# Patient Record
Sex: Female | Born: 1940 | Race: Black or African American | Hispanic: No | Smoking: Former smoker
Health system: Southern US, Community
[De-identification: ages and names within clinical notes are randomized; demographics above are authoritative.]

## PROBLEM LIST (undated history)

## (undated) DIAGNOSIS — I700 Atherosclerosis of aorta: Secondary | ICD-10-CM

## (undated) DIAGNOSIS — D4989 Neoplasm of unspecified behavior of other specified sites: Secondary | ICD-10-CM

## (undated) DIAGNOSIS — D496 Neoplasm of unspecified behavior of brain: Secondary | ICD-10-CM

## (undated) DIAGNOSIS — K828 Other specified diseases of gallbladder: Secondary | ICD-10-CM

## (undated) DIAGNOSIS — I10 Essential (primary) hypertension: Secondary | ICD-10-CM

## (undated) DIAGNOSIS — I3481 Nonrheumatic mitral (valve) annulus calcification: Secondary | ICD-10-CM

## (undated) DIAGNOSIS — E7800 Pure hypercholesterolemia, unspecified: Secondary | ICD-10-CM

## (undated) DIAGNOSIS — IMO0001 Reserved for inherently not codable concepts without codable children: Secondary | ICD-10-CM

## (undated) DIAGNOSIS — M1990 Unspecified osteoarthritis, unspecified site: Secondary | ICD-10-CM

## (undated) DIAGNOSIS — I517 Cardiomegaly: Secondary | ICD-10-CM

## (undated) DIAGNOSIS — R1013 Epigastric pain: Secondary | ICD-10-CM

## (undated) DIAGNOSIS — I059 Rheumatic mitral valve disease, unspecified: Secondary | ICD-10-CM

## (undated) DIAGNOSIS — K635 Polyp of colon: Secondary | ICD-10-CM

## (undated) HISTORY — DX: Other specified diseases of gallbladder: K82.8

## (undated) HISTORY — DX: Atherosclerosis of aorta: I70.0

## (undated) HISTORY — DX: Cardiomegaly: I51.7

## (undated) HISTORY — DX: Polyp of colon: K63.5

## (undated) HISTORY — DX: Rheumatic mitral valve disease, unspecified: I05.9

## (undated) HISTORY — DX: Reserved for inherently not codable concepts without codable children: IMO0001

## (undated) HISTORY — DX: Epigastric pain: R10.13

## (undated) HISTORY — DX: Pure hypercholesterolemia, unspecified: E78.00

## (undated) HISTORY — DX: Essential (primary) hypertension: I10

## (undated) HISTORY — DX: Unspecified osteoarthritis, unspecified site: M19.90

## (undated) HISTORY — DX: Neoplasm of unspecified behavior of other specified sites: D49.89

## (undated) HISTORY — DX: Nonrheumatic mitral (valve) annulus calcification: I34.81

## (undated) HISTORY — DX: Neoplasm of unspecified behavior of brain: D49.6

---

## 1979-05-16 HISTORY — PX: TUBAL LIGATION: SHX77

## 1989-05-15 HISTORY — PX: OTHER SURGICAL HISTORY: SHX169

## 1996-05-15 HISTORY — PX: OTHER SURGICAL HISTORY: SHX169

## 2000-04-02 ENCOUNTER — Encounter (INDEPENDENT_AMBULATORY_CARE_PROVIDER_SITE_OTHER): Payer: Self-pay | Admitting: Specialist

## 2000-04-02 ENCOUNTER — Ambulatory Visit (HOSPITAL_COMMUNITY): Admission: RE | Admit: 2000-04-02 | Discharge: 2000-04-02 | Payer: Self-pay | Admitting: *Deleted

## 2000-12-13 ENCOUNTER — Other Ambulatory Visit: Admission: RE | Admit: 2000-12-13 | Discharge: 2000-12-13 | Payer: Self-pay | Admitting: Family Medicine

## 2001-01-01 ENCOUNTER — Ambulatory Visit (HOSPITAL_COMMUNITY): Admission: RE | Admit: 2001-01-01 | Discharge: 2001-01-01 | Payer: Self-pay | Admitting: Family Medicine

## 2001-01-01 ENCOUNTER — Encounter: Payer: Self-pay | Admitting: Family Medicine

## 2002-01-06 ENCOUNTER — Encounter: Payer: Self-pay | Admitting: Family Medicine

## 2002-01-06 ENCOUNTER — Ambulatory Visit (HOSPITAL_COMMUNITY): Admission: RE | Admit: 2002-01-06 | Discharge: 2002-01-06 | Payer: Self-pay | Admitting: Family Medicine

## 2003-01-23 ENCOUNTER — Ambulatory Visit (HOSPITAL_COMMUNITY): Admission: RE | Admit: 2003-01-23 | Discharge: 2003-01-23 | Payer: Self-pay | Admitting: Family Medicine

## 2003-01-23 ENCOUNTER — Encounter: Payer: Self-pay | Admitting: Family Medicine

## 2003-04-04 ENCOUNTER — Emergency Department (HOSPITAL_COMMUNITY): Admission: AD | Admit: 2003-04-04 | Discharge: 2003-04-04 | Payer: Self-pay | Admitting: Family Medicine

## 2003-07-22 ENCOUNTER — Ambulatory Visit (HOSPITAL_COMMUNITY): Admission: RE | Admit: 2003-07-22 | Discharge: 2003-07-22 | Payer: Self-pay | Admitting: *Deleted

## 2004-02-01 ENCOUNTER — Ambulatory Visit (HOSPITAL_COMMUNITY): Admission: RE | Admit: 2004-02-01 | Discharge: 2004-02-01 | Payer: Self-pay | Admitting: Family Medicine

## 2004-02-02 ENCOUNTER — Emergency Department (HOSPITAL_COMMUNITY): Admission: EM | Admit: 2004-02-02 | Discharge: 2004-02-02 | Payer: Self-pay | Admitting: *Deleted

## 2004-08-01 ENCOUNTER — Ambulatory Visit (HOSPITAL_COMMUNITY): Admission: RE | Admit: 2004-08-01 | Discharge: 2004-08-01 | Payer: Self-pay | Admitting: Family Medicine

## 2005-02-21 ENCOUNTER — Ambulatory Visit (HOSPITAL_COMMUNITY): Admission: RE | Admit: 2005-02-21 | Discharge: 2005-02-21 | Payer: Self-pay | Admitting: Family Medicine

## 2006-02-22 ENCOUNTER — Ambulatory Visit (HOSPITAL_COMMUNITY): Admission: RE | Admit: 2006-02-22 | Discharge: 2006-02-22 | Payer: Self-pay | Admitting: Family Medicine

## 2007-03-01 ENCOUNTER — Ambulatory Visit (HOSPITAL_COMMUNITY): Admission: RE | Admit: 2007-03-01 | Discharge: 2007-03-01 | Payer: Self-pay | Admitting: Family Medicine

## 2007-11-12 ENCOUNTER — Emergency Department (HOSPITAL_COMMUNITY): Admission: EM | Admit: 2007-11-12 | Discharge: 2007-11-12 | Payer: Self-pay | Admitting: Emergency Medicine

## 2008-03-03 ENCOUNTER — Ambulatory Visit (HOSPITAL_COMMUNITY): Admission: RE | Admit: 2008-03-03 | Discharge: 2008-03-03 | Payer: Self-pay | Admitting: Family Medicine

## 2009-03-05 ENCOUNTER — Ambulatory Visit (HOSPITAL_COMMUNITY): Admission: RE | Admit: 2009-03-05 | Discharge: 2009-03-05 | Payer: Self-pay | Admitting: Family Medicine

## 2010-03-03 ENCOUNTER — Emergency Department (HOSPITAL_COMMUNITY): Admission: EM | Admit: 2010-03-03 | Discharge: 2010-03-03 | Payer: Self-pay | Admitting: Emergency Medicine

## 2010-03-07 ENCOUNTER — Ambulatory Visit (HOSPITAL_COMMUNITY): Admission: RE | Admit: 2010-03-07 | Discharge: 2010-03-07 | Payer: Self-pay | Admitting: Family Medicine

## 2010-09-30 NOTE — Procedures (Signed)
NAMEBRAYLIN, [name]           ACCOUNT NO.:  [id number]   MEDICAL RECORD NO.:  [id number]          PATIENT TYPE:  OUT   LOCATION:  RAD                           FACILITY:  [facility]   PHYSICIAN:  Dani Gobble, MD       DATE OF BIRTH:  06-30-1940   DATE OF PROCEDURE:  08/01/2004  DATE OF DISCHARGE:                                  ECHOCARDIOGRAM   REFERRING PHYSICIAN:  Dr. Christell Constant.   INDICATIONS:  [name] is a 70 year old female with hypertension and a  systolic murmur.   The technical quality of the study is adequate.   The aorta is within normal limits at 3.2 cm.   The left atrium is just mildly dilated, measuring at 4.1 cm.  No obvious  clots or masses are appreciated, and the patient appeared to be in sinus  rhythm during this procedure.   The intraventricular septum and posterior wall are both mildly thickened  with additional basal septal hypertrophy overlay.   The aortic valve is mildly thickened but trileaflet and pliable with normal  leaflet excursion.  No significant aortic insufficiency is noted.  Doppler  interrogation of the aortic valve reveals peak velocity of 1.3 meters per  second, corresponding to a peak gradient of 7 mmHg and a mean gradient of 5  mmHg.   The mitral valve is also mildly thickened but with normal leaflet excursion.  Mild mitral annular calcification is noted.  No mitral valve prolapse is  noted.  Trivial mitral regurgitation is noted.  Doppler interrogation of the  mitral valve is within normal limits.   The pulmonic valve is not well visualized.   The tricuspid valve appears grossly structurally normal with trivial  tricuspid regurgitation noted.   The left ventricle is normal in size with the LVIDD measured at 4.1 cm, and  the LVISD measured at 2.6 cm.  Overall left ventricular systolic function is  normal, and no regional wall motion abnormalities are noted.  The presence  of diastolic dysfunction is inferred from the pulse wave  Doppler across the  mitral valve.   The right atrium and right ventricle are normal in size, and the right  ventricular systolic function is normal.   There is a small posterior pericardial effusion without evidence of  hemodynamic embarrassment.   IMPRESSION:  1.  Mild left atrial dilatation.  2.  Mild concentric left ventricular hypertrophy with additional basal      septal hypertrophy overlay.  3.  Mild aortic sclerosis without stenosis.  4.  Mildly thickened mitral valve but with normal leaflet excursion.  5.  Mild mitral annular calcification.  6.  Trivial mitral and tricuspid regurgitation.  7.  Normal left ventricular size and systolic function without regional wall      motion abnormality noted.  8.  The presence of diastolic dysfunction is inferred from a pulse wave      Doppler across the mitral valve.  9.  There is a small posterior pericardial effusion without evidence of      hemodynamic embarrassment or compromise.      AB/MEDQ  D:  08/01/2004  T:  08/01/2004  Job:  [id number]   cc:   Ernestina Penna, M.D.  [address] [place]  [place] [address]  Fax: [phone]

## 2010-12-21 ENCOUNTER — Ambulatory Visit (HOSPITAL_COMMUNITY)
Admission: RE | Admit: 2010-12-21 | Discharge: 2010-12-21 | Disposition: A | Discharge status: Home / Self Care | Payer: Medicare Other | Source: Ambulatory Visit | Attending: Family Medicine | Admitting: Family Medicine

## 2010-12-21 ENCOUNTER — Other Ambulatory Visit: Payer: Self-pay | Admitting: Family Medicine

## 2010-12-21 DIAGNOSIS — K3189 Other diseases of stomach and duodenum: Secondary | ICD-10-CM | POA: Insufficient documentation

## 2010-12-21 DIAGNOSIS — A048 Other specified bacterial intestinal infections: Secondary | ICD-10-CM | POA: Insufficient documentation

## 2010-12-21 DIAGNOSIS — R1013 Epigastric pain: Secondary | ICD-10-CM

## 2010-12-21 DIAGNOSIS — N281 Cyst of kidney, acquired: Secondary | ICD-10-CM | POA: Insufficient documentation

## 2010-12-21 DIAGNOSIS — R0789 Other chest pain: Secondary | ICD-10-CM

## 2010-12-21 DIAGNOSIS — K838 Other specified diseases of biliary tract: Secondary | ICD-10-CM | POA: Insufficient documentation

## 2011-01-04 ENCOUNTER — Observation Stay (HOSPITAL_COMMUNITY)
Admission: EM | Admit: 2011-01-04 | Discharge: 2011-01-05 | Disposition: A | Discharge status: Home / Self Care | Payer: Medicare Other | Attending: Internal Medicine | Admitting: Internal Medicine

## 2011-01-04 ENCOUNTER — Emergency Department (HOSPITAL_COMMUNITY): Payer: Medicare Other

## 2011-01-04 DIAGNOSIS — D4989 Neoplasm of unspecified behavior of other specified sites: Secondary | ICD-10-CM | POA: Insufficient documentation

## 2011-01-04 DIAGNOSIS — I517 Cardiomegaly: Secondary | ICD-10-CM

## 2011-01-04 DIAGNOSIS — E669 Obesity, unspecified: Secondary | ICD-10-CM | POA: Insufficient documentation

## 2011-01-04 DIAGNOSIS — I10 Essential (primary) hypertension: Secondary | ICD-10-CM | POA: Insufficient documentation

## 2011-01-04 DIAGNOSIS — E7800 Pure hypercholesterolemia, unspecified: Secondary | ICD-10-CM | POA: Insufficient documentation

## 2011-01-04 DIAGNOSIS — R0609 Other forms of dyspnea: Secondary | ICD-10-CM | POA: Insufficient documentation

## 2011-01-04 DIAGNOSIS — R079 Chest pain, unspecified: Principal | ICD-10-CM | POA: Insufficient documentation

## 2011-01-04 DIAGNOSIS — R0989 Other specified symptoms and signs involving the circulatory and respiratory systems: Secondary | ICD-10-CM | POA: Insufficient documentation

## 2011-01-04 DIAGNOSIS — R5381 Other malaise: Secondary | ICD-10-CM | POA: Insufficient documentation

## 2011-01-04 DIAGNOSIS — R5383 Other fatigue: Secondary | ICD-10-CM | POA: Insufficient documentation

## 2011-01-04 DIAGNOSIS — Z85841 Personal history of malignant neoplasm of brain: Secondary | ICD-10-CM | POA: Insufficient documentation

## 2011-01-04 LAB — DIFFERENTIAL
Basophils Absolute: 0 10*3/uL (ref 0.0–0.1)
Basophils Relative: 0 % (ref 0–1)
Lymphocytes Relative: 52 % — ABNORMAL HIGH (ref 12–46)
Lymphs Abs: 2.8 10*3/uL (ref 0.7–4.0)
Monocytes Relative: 8 % (ref 3–12)
Neutro Abs: 2.1 10*3/uL (ref 1.7–7.7)

## 2011-01-04 LAB — CBC: WBC: 5.4 10*3/uL (ref 4.0–10.5)

## 2011-01-04 LAB — BASIC METABOLIC PANEL
Chloride: 101 mEq/L (ref 96–112)
GFR calc Af Amer: >60 mL/min (ref >60)
Glucose, Bld: 105 mg/dL — ABNORMAL HIGH (ref 70–99)

## 2011-01-04 LAB — CARDIAC PANEL(CRET KIN+CKTOT+MB+TROPI)
CK, MB: 2.9 ng/mL (ref 0.3–4.0)
CK, MB: 2.9 ng/mL (ref 0.3–4.0)
Relative Index: 2.1 (ref 0.0–2.5)
Total CK: 140 U/L (ref 7–177)
Total CK: 156 U/L (ref 7–177)
Troponin I: <0.3 ng/mL (ref <0.30)

## 2011-01-04 LAB — PRO B NATRIURETIC PEPTIDE: Pro B Natriuretic peptide (BNP): 25.7 pg/mL (ref 0–125)

## 2011-01-04 NOTE — H&P (Signed)
[name], [name] NO.:  [id number]  MEDICAL RECORD NO.:  [id number]  LOCATION:  [facility]                         FACILITY:  [facility]  PHYSICIAN:  Taylor Ruiz, MD    DATE OF BIRTH:  31-Oct-1940  DATE OF ADMISSION:  01/04/2011 DATE OF DISCHARGE:                             HISTORY & PHYSICAL   CHIEF COMPLAINT:  Chest pain.  HISTORY OF PRESENT ILLNESS:  The patient is a 70 year old black female, retired International aid/development worker, who came to the emergency department for left-sided chest pain radiating to the left arm since last night.  The patient was doing well until last night when she went to the mailbox and when she came back and sat in the chair she developed left-sided chest pain described as chest heaviness and tightness radiating to the left shoulder.  No associated diaphoresis, palpitations, lightheadedness, dizziness, or syncope.  The pain lasted a few minutes and improved, but was not completely resolved.  She was not able to sleep well and this morning had another recurrence of a similar pain.  The patient was evaluated by Dr. Juleen [name], emergency room physician, who performed a chest x-ray normal, EKG normal, set of, cardiac enzymes with negative troponin, BMP normal, CBC and CMP unremarkable except for mildly decreased potassium.  I have contacted her primary care physician, Dr. Tanya [name] and Dr. Christell [name] from [facility] at [phone], and I spoke with Dr. Modesto [name], who was covering for Dr. Tanya [name].  He told me that the patient has been having atypical chest pain since earlier this month for which she was seen once or twice and underwent an ultrasound of the abdomen revealing gallbladder sludge.  He referred her to Cardiology for a cardiac evaluation, but the patient subsequently cancelled the appointment.  In addition, the patient underwent blood testing that revealed positive serology for H. pylori and was placed on Prevpac without improvement  in her symptoms.  At the present time, the patient is chest pain-free and has no other complaints.  PAST MEDICAL HISTORY: 1. Hypertension, essential, severe. 2. Mild LVH with last echocardiogram performed in August 01, 2004, that     showed,     a.     Mild left atrial dilatation.     b.     Mild concentric LVH with additional vasal septal hypertrophy      overlay.     c.     Mild aortic sclerosis without stenosis.     d.     Mildly thickened mitral valve, but with normal leaflet      excursion.     e.     Mild mitral annular calcification.     f.     Trivial mitral and tricuspid regurgitation.     g.     Normal left ventricular size and systolic function without      regional wall motion abnormality.     h.     Small posterior pericardial effusion without evidence of      hemodynamic compromise. 3. Hypercholesterolemia. 4. Gallbladder sludge. 5. Brain tumor. 6. 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  MEDICATIONS: 1. Lipitor 80 mg a day. 2. Exforge HCT 320/10/25 mg. 3. Prevpac. 4.  Also takes aspirin 81 mg a day.  ALLERGIES:  No known drug allergies.  FAMILY HISTORY:  Stroke in her paternal grandmother.  No history of coronary artery disease, sudden death, or CHF.  SOCIAL HISTORY:  The patient is a retired Corporate treasurer and a widow.  She lives by herself and her children take care of her.  No toxic habits including no smoking tobacco, alcohol, or drug use.  REVIEW OF SYSTEMS:  CONSTITUTIONAL:  She is positive for fatigue, no malaise, fever, chills, night sweats, or weight loss.  CARDIOVASCULAR: As in HPI.  She denies edema, orthopnea, nocturia, or PND.  RESPIRATORY: Denies history of cough, wheezing, or pleuritic chest pain. GASTROINTESTINAL:  Denies abdominal pain, nausea, vomiting, diarrhea, constipation, hematochezia, or melena.  GU:  Denies dysuria, frequency, or hematuria.  Admits to some urinary incontinence with stress.  NEURO: Denies headaches, focal  weakness, numbness, or paresthesias.  PHYSICAL EXAMINATION:  VITAL SIGNS:  Blood pressure 139/79, heart rate is 84, respirations 16, temperature 98.4, saturation 96% on room air. GENERAL APPEARANCE:  The patient is an African-American overweight female, who lays in the stretcher, in no acute distress.  She is pleasant and cooperative. HEENT:  Unremarkable. NECK:  Supple without distention of jugular veins and no carotid bruits. HEART:  Regular S1 and S2 without gallops, murmurs, or rubs.  Due to body habitus, I am unable to palpate her PMI. LUNGS:  Clear to auscultation anteriorly and posteriorly. ABDOMEN:  Obese, soft, nontender without organomegaly or masses palpable. EXTREMITIES:  Without clubbing, cyanosis, or edema. NEUROLOGICAL:  Nonfocal.  LABORATORY DATA:  EKG, normal sinus rhythm at 89 beats per minute without specific ST or T-wave changes.  Chest x-ray negative.  Basic metabolic panel is normal except for a potassium of 3.4, troponin 0. CBC is normal.  Brain natriuretic peptide 25.7, which is normal.  IMPRESSION AND PLAN: 53. A 70 year old female with a history of severe hypertension with     mild LVH, hypercholesterolemia, presenting with chest pain without     EKG changes and normal cardiac enzymes including troponin and     normal BMP, normal chest x-ray.  Plan is to admit the patient for     observation in telemetry unit.  Cycle cardiac enzymes q.8 hours.     Repeat EKG in a.m.  If everything is negative, obtain stress test.     Cardiac consultation if indicated. 2. Hypokalemia.  KCl 40 mEq p.o. b.i.d. times 1 day. 3. Hypertension.  Continue Exforge at a current dose of 320/25/10. 4. Hypercholesterolemia.  Start simvastatin 40 mg a day.          ______________________________ Taylor Ruiz, MD     GL/MEDQ  D:  01/04/2011  T:  01/04/2011  Job:  [id number]  cc:   [name]  Electronically Signed by Taylor Ruiz MD on 01/04/2011 04:33:31  PM

## 2011-01-18 NOTE — Discharge Summary (Signed)
[name], [name] NO.:  [id number]  MEDICAL RECORD NO.:  [id number]  LOCATION:                                 FACILITY:  PHYSICIAN:  Taylor Llano, MD       DATE OF BIRTH:  October 16, 1940  DATE OF ADMISSION: DATE OF DISCHARGE:                              DISCHARGE SUMMARY   PRIMARY CARE PHYSICIAN:  Taylor Mouse II, MD  REASON FOR ADMISSION:  Chest pain.  DISCHARGE DIAGNOSES: 1. Chest pain, acute coronary syndrome ruled out. 2. Hypertension. 3. Hypercholesterolemia. 4. History of brain tumor. 5. 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 6. Obesity.  DISCHARGE MEDICATIONS: 1. Aspirin 81 mg p.o. daily. 2. Atorvastatin 80 mg p.o. daily. 3. Exforge HCT 10/320/25 p.o. daily. 4. Omega-3 acid ethyl esters 1 g p.o. daily. 5. Prevpac 30/500/500 p.o. b.i.d. 6. Vitamin B6 - 100 mg p.o. daily. 7. Vitamin C  1000 mg p.o. daily. 8. Vitamin D3 - 2000 units p.o. daily.  BRIEF HISTORY AND EXAMINATION:  [name] is a 70 year old African American female with past medical history of hypertension and dyslipidemia.  The patient came into the hospital complaining about left- sided chest pain.  The patient said she was in her usual state of health until the day of admission when she walked down and brought her mail and when she sat on the chair, she developed left-sided chest pain.  The pain is inframammary, feels as chest heaviness to tightness, a band around her left side of chest wall and radiates to the shoulder.  The patient denies any associated diaphoresis, palpitation, lightheadedness, or dizziness or syncope.  The pain lasted for less than minute and then went away.  The patient mentioned she did have previous episodes like this at the beginning of the month when the patient has been referred to a cardiologist, but the appointment was postponed because patient is being treated for H. pylori.  Upon initial evaluation in the emergency department, first  set of cardiac enzymes was negative.  The patient was admitted to rule out acute coronary syndrome.  RADIOLOGY: 1. Chest x-ray showed no evidence of pneumonia. 2. Echocardiogram showed ejection fraction of 55-60%.  Wall thickness     was increased in a pattern of moderate LVH.  There are no     significant valvular abnormalities.  BRIEF HOSPITAL COURSE: 1. Chest pain.  The patient was admitted to the hospital to a     telemetry bed as observation to rule out acute coronary syndrome.     Three sets of cardiac enzymes as well as EKG and repeat EKG were     negative for myocardial ischemia.  Chest x-ray was negative.     ProBNP is 25.  EKG and telemetry did not show any arrhythmias.  The     patient was still deemed safe to be discharged out of the hospital     after acute coronary syndrome ruled out.  Because of the patient's     symptoms recurrence, she needs likely a stress test to be done as     outpatient.  The patient was told to call the cardiologist again     and try to reschedule  her appointment for possible stress test as  outpatient. 2. Hypertension.  The patient's blood pressure was controlled     overnight.  Her Exforge HCT was restarted during the hospital stay.     The patient's blood pressure showed good control. 3. Dyslipidemia.  Her FLP was not obtained during this     hospitalization, but the patient was on atorvastatin 80 mg.  Please     note that there is a high risk of rhabdomyolysis with this high     dose of statin with concurrent use of amlodipine. 4. Hypokalemia.  There is slight hypokalemia.  The patient's potassium     was 3.4 at the time of admission, this was repleted with potassium     supplementation.  DISCHARGE INSTRUCTIONS: 1. Activity as tolerated. 2. Disposition home. 3. Diet - heart-healthy diet.     Taylor Llano, MD     ME/MEDQ  D:  01/05/2011  T:  01/05/2011  Job:  [id number]  cc:   Taylor Manges, MD  Electronically Signed by  [name]  on 01/18/2011 03:05:29 PM

## 2011-01-19 ENCOUNTER — Encounter: Payer: Self-pay | Admitting: Family Medicine

## 2011-01-19 ENCOUNTER — Other Ambulatory Visit: Payer: Self-pay | Admitting: Family Medicine

## 2011-01-19 ENCOUNTER — Telehealth: Payer: Self-pay | Admitting: Cardiovascular Disease

## 2011-01-19 DIAGNOSIS — K635 Polyp of colon: Secondary | ICD-10-CM | POA: Insufficient documentation

## 2011-01-19 DIAGNOSIS — E785 Hyperlipidemia, unspecified: Secondary | ICD-10-CM | POA: Insufficient documentation

## 2011-01-19 DIAGNOSIS — D496 Neoplasm of unspecified behavior of brain: Secondary | ICD-10-CM

## 2011-01-19 DIAGNOSIS — H3500 Unspecified background retinopathy: Secondary | ICD-10-CM | POA: Insufficient documentation

## 2011-01-19 DIAGNOSIS — K828 Other specified diseases of gallbladder: Secondary | ICD-10-CM

## 2011-01-19 DIAGNOSIS — M353 Polymyalgia rheumatica: Secondary | ICD-10-CM

## 2011-01-19 DIAGNOSIS — M069 Rheumatoid arthritis, unspecified: Secondary | ICD-10-CM | POA: Insufficient documentation

## 2011-01-19 DIAGNOSIS — I10 Essential (primary) hypertension: Secondary | ICD-10-CM | POA: Insufficient documentation

## 2011-01-19 DIAGNOSIS — E669 Obesity, unspecified: Secondary | ICD-10-CM

## 2011-01-19 DIAGNOSIS — A048 Other specified bacterial intestinal infections: Secondary | ICD-10-CM

## 2011-01-19 NOTE — Telephone Encounter (Signed)
App made 

## 2011-01-19 NOTE — Telephone Encounter (Signed)
Kim from [facility] called to schedule an appt for a former Dr. Orpah Cobb patient from 2004 for a consult since it's been over 3 years.  She states patient has atypical chest pain.  Pt is not in the office at this time but needs an appt in the next few weeks.  Please call office back to schedule pt sooner than next available.  Looking for chart for 2004 if available.

## 2011-01-20 ENCOUNTER — Encounter: Payer: Self-pay | Admitting: *Deleted

## 2011-01-23 ENCOUNTER — Encounter: Payer: Self-pay | Admitting: *Deleted

## 2011-01-26 ENCOUNTER — Ambulatory Visit (INDEPENDENT_AMBULATORY_CARE_PROVIDER_SITE_OTHER): Payer: BC Managed Care – PPO | Admitting: Cardiovascular Disease

## 2011-01-26 ENCOUNTER — Encounter: Payer: Self-pay | Admitting: Cardiovascular Disease

## 2011-01-26 DIAGNOSIS — R079 Chest pain, unspecified: Secondary | ICD-10-CM | POA: Insufficient documentation

## 2011-01-26 NOTE — Assessment & Plan Note (Addendum)
She recently had a hospitalization for chest pain. She ruled out for myocardial infarction. It was recommended that she see me for stress test.  Meanwhile she's been found to have an H. pylori infection and she may have gastritis or gastric ulcer.  We'll schedule her for a 2 day stress Myoview study. We will see her back on an as-needed basis following the stress test.

## 2011-01-26 NOTE — Progress Notes (Signed)
[name] Date of Birth  [birth year] [facility] [address]     [address] [place], [place]  [address] [phone]  Fax  [phone]  History of Present Illness:  70 year old female who I seen in the past.  She was recently admitted to the hospital with an episode of chest pain. She ruled out for myocardial infarction. She now presents for a stress test.  She has a history of arthritis. She's been taking prednisone for her arthritis and she thinks that that has increased her shortness of breath.   May be due to fluid retention.    Current Outpatient Prescriptions on File Prior to Visit  Medication Sig Dispense Refill  . Amlodipine-Valsartan-HCTZ 10-320-25 MG TABS Take 1 tablet by mouth daily.        . Ascorbic Acid (VITAMIN C) 1000 MG tablet Take 1,000 mg by mouth daily.        Marland Kitchen aspirin 81 MG chewable tablet Chew 81 mg by mouth daily.        Marland Kitchen atorvastatin (LIPITOR) 80 MG tablet Take 80 mg by mouth daily.        . Cholecalciferol (VITAMIN D3) 2000 UNITS TABS Take 1 capsule by mouth daily.        Marland Kitchen omega-3 acid ethyl esters (LOVAZA) 1 G capsule Take 1 g by mouth daily.        Marland Kitchen omeprazole (PRILOSEC) 40 MG capsule Take 40 mg by mouth daily.        Marland Kitchen pyridOXINE (VITAMIN B-6) 100 MG tablet Take 100 mg by mouth daily.          Allergies  Allergen Reactions  . Vytorin (Ezetimibe-Simvastatin) Other (See Comments)    myalgia    Past Medical History  Diagnosis Date  . Chest pain   . Dyspepsia   . Hypertension   . Left atrial dilatation     Mild  . Mild aortic sclerosis     without stenosis  . Mitral valve disorder     Mildly thickened mitral valve, but with normal leaflet but with normal leaflet   . Mitral annular calcification     Mild  . Hypercholesterolemia   . Brain tumor   . 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 Gallbladder sludge     Past Surgical History  Procedure Date  .  Tubal ligation 1981  . Rt knee surg 1991  . Lt eye implant 1998    History  Smoking status  . Former Smoker  Smokeless tobacco  . Not on file    History  Alcohol Use: Not on file    Family History  Problem Relation Age of Onset  . Stroke Paternal Grandmother     Reviw of Systems:  Reviewed in the HPI.  All other systems are negative.  Physical Exam: BP 152/88  Pulse 74  Ht 5\' 4"  (1.626 m)  Wt 241 lb (109.317 kg)  BMI 41.37 kg/m2 The patient is alert and oriented x 3.  The mood and affect are normal.   Skin: warm and dry.  Color is normal.    HEENT:   the sclera are nonicteric.  The mucous membranes are moist.  The carotids are 2+ without bruits.  There is no thyromegaly.  There is no JVD.    Lungs: clear.  The chest wall is non tender.    Heart: regular rate with  a normal S1 and S2.  There are no murmurs, gallops, or rubs. The PMI is not displaced.     Abdomen: good bowel sounds.  There is no guarding or rebound.  There is no hepatosplenomegaly or tenderness.  There are no masses.   Extremities:  no clubbing, cyanosis, or edema.  The legs are without rashes.  The distal pulses are intact.   Neuro:  Cranial nerves II - XII are intact.  Motor and sensory functions are intact.    The gait is normal.  ECG:  Assessment / Plan:

## 2011-02-01 ENCOUNTER — Encounter: Payer: Self-pay | Admitting: *Deleted

## 2011-02-09 LAB — POCT I-STAT, CHEM 8
Calcium, Ion: 1.21
Chloride: 106
Glucose, Bld: 106 — ABNORMAL HIGH
HCT: 39

## 2011-02-09 LAB — CBC
HCT: 37.8
MCHC: 33.3
Platelets: 308
RDW: 14.9
WBC: 6.7

## 2011-02-09 LAB — POCT CARDIAC MARKERS: Troponin i, poc: <0.05

## 2011-02-16 ENCOUNTER — Ambulatory Visit (HOSPITAL_COMMUNITY): Payer: Medicare Other | Attending: Cardiovascular Disease | Admitting: Radiology

## 2011-02-16 DIAGNOSIS — I119 Hypertensive heart disease without heart failure: Secondary | ICD-10-CM

## 2011-02-16 DIAGNOSIS — R0789 Other chest pain: Secondary | ICD-10-CM

## 2011-02-16 DIAGNOSIS — R079 Chest pain, unspecified: Secondary | ICD-10-CM | POA: Insufficient documentation

## 2011-02-16 MED ORDER — REGADENOSON 0.4 MG/5ML IV SOLN
0.4000 mg | Freq: Once | INTRAVENOUS | Status: AC
  Administered 2011-02-16: 0.4 mg via INTRAVENOUS

## 2011-02-16 MED ORDER — TECHNETIUM TC 99M TETROFOSMIN IV KIT
33.0000 | PACK | Freq: Once | INTRAVENOUS | Status: AC | PRN
  Administered 2011-02-16: 33 via INTRAVENOUS

## 2011-02-16 NOTE — Progress Notes (Signed)
[facility] SITE 3 NUCLEAR MED [address] [place] [address] [phone]  Cardiology Nuclear Med Study  [name] is a 70 y.o. female [id number] [birth year]   Nuclear Med Background Indication for Stress Test:  Evaluation for Ischemia and 01/05/11 Post Hospital: [place], [place] R/O History: 01/04/11 Echo: EF 55-60% mild LVH and '04 Myocardial Perfusion Study: (-) EF 72% Cardiac Risk Factors: History of Smoking, Hypertension, Lipids and Obesity  Symptoms:  Chest Pressure and Chest Tightness   Nuclear Pre-Procedure Caffeine/Decaff Intake:  None NPO After: 9:00pm   Lungs:  clear IV 0.9% NS with Angio Cath:  20g  IV Site: R Antecubital  IV Started by:  Stanton Kidney, EMT-P  Chest Size (in):  48 Cup Size: D  Height: 5\' 4"  (1.626 m)  Weight:  245 lb (111.131 kg)  BMI:  Body mass index is 42.05 kg/(m^2). Tech Comments:  NA    Nuclear Med Study 1 or 2 day study: 2 day  Stress Test Type:  Lexiscan  Reading OZ:HYQMVH Marrian Bells,M.D. Order Authorizing Provider:  P.Nahser  Resting Radionuclide: Technetium 55m Tetrofosmin  Resting Radionuclide Dose: 33.0 mCi   Stress Radionuclide:  Technetium 12m Tetrofosmin  Stress Radionuclide Dose: 33.0 mCi           Stress Protocol Rest HR: 82 Stress HR: 131  Rest BP: 156/75 Stress BP: 218/97  Exercise Time (min): n/a METS: n/a   Predicted Max HR: 151 bpm % Max HR: 86.75 bpm Rate Pressure Product: 84696   Dose of Adenosine (mg):  n/a Dose of Lexiscan: 0.4 mg  Dose of Atropine (mg): n/a Dose of Dobutamine: n/a mcg/kg/min (at max HR)  Stress Test Technologist: Milana Na, EMT-P  Nuclear Technologist:  Domenic Polite, CNMT     Rest Procedure:  Myocardial perfusion imaging was performed at rest 45 minutes following the intravenous administration of Technetium 53m Tetrofosmin. Rest ECG: NSR  Stress Procedure:  The patient received IV Lexiscan 0.4 mg over 15-seconds.  Technetium 2m Tetrofosmin injected at  30-seconds.  There were non specific changes with Lexiscan. The patient had no symptoms with Lexiscan.  Quantitative spect images were obtained after a 45 minute delay. Stress ECG: No significant change from baseline ECG  QPS Raw Data Images:  There is a breast shadow that accounts for the anterior attenuation. Stress Images:  There is decreased uptake in the anterior wall. Rest Images:  Normal homogeneous uptake in all areas of the myocardium. Subtraction (SDS):  There is a reversibe anterior defect which appears to be due to shifting breast attenuation seen on the raw images.  Transient Ischemic Dilatation (Normal <1.22):  0.83 Lung/Heart Ratio (Normal <0.45):  0.28  Quantitative Gated Spect Images QGS EDV:  66 ml QGS ESV:  14 ml QGS cine images:  NL LV Function; NL Wall Motion QGS EF: 78%  Impression Exercise Capacity:  Lexiscan with low level exercise. BP Response:  n/a  Clinical Symptoms:  n/a  ECG Impression:  No significant ECG changes with Lexiscan and low-level exercise . Comparison with Prior Nuclear Study: No previous nuclear study performed  Overall Impression:   Probably normal Lexiscan Myoview. There is a reversibe anterior defect which appears to be due to shifting breast attenuation seen on the raw images.  [name]

## 2011-02-20 ENCOUNTER — Other Ambulatory Visit: Payer: Self-pay | Admitting: Family Medicine

## 2011-02-20 DIAGNOSIS — Z139 Encounter for screening, unspecified: Secondary | ICD-10-CM

## 2011-02-21 ENCOUNTER — Ambulatory Visit (HOSPITAL_COMMUNITY): Payer: Medicare Other | Attending: Cardiovascular Disease | Admitting: Radiology

## 2011-02-21 DIAGNOSIS — R0989 Other specified symptoms and signs involving the circulatory and respiratory systems: Secondary | ICD-10-CM

## 2011-02-21 MED ORDER — TECHNETIUM TC 99M TETROFOSMIN IV KIT
33.0000 | PACK | Freq: Once | INTRAVENOUS | Status: AC | PRN
  Administered 2011-02-21: 33 via INTRAVENOUS

## 2011-03-10 ENCOUNTER — Ambulatory Visit (HOSPITAL_COMMUNITY)
Admission: RE | Admit: 2011-03-10 | Discharge: 2011-03-10 | Disposition: A | Discharge status: Home / Self Care | Payer: Medicare Other | Source: Ambulatory Visit | Attending: Family Medicine | Admitting: Family Medicine

## 2011-03-10 DIAGNOSIS — Z139 Encounter for screening, unspecified: Secondary | ICD-10-CM

## 2011-03-10 DIAGNOSIS — Z1231 Encounter for screening mammogram for malignant neoplasm of breast: Secondary | ICD-10-CM | POA: Insufficient documentation

## 2011-03-31 ENCOUNTER — Other Ambulatory Visit: Payer: Self-pay | Admitting: Family Medicine

## 2011-03-31 DIAGNOSIS — R140 Abdominal distension (gaseous): Secondary | ICD-10-CM

## 2011-04-05 ENCOUNTER — Encounter (HOSPITAL_COMMUNITY): Payer: Medicare Other

## 2011-04-12 ENCOUNTER — Encounter (HOSPITAL_COMMUNITY)
Admission: RE | Admit: 2011-04-12 | Discharge: 2011-04-12 | Disposition: A | Discharge status: Home / Self Care | Payer: Medicare Other | Source: Ambulatory Visit | Attending: Family Medicine | Admitting: Family Medicine

## 2011-04-12 ENCOUNTER — Encounter (HOSPITAL_COMMUNITY): Payer: Self-pay

## 2011-04-12 DIAGNOSIS — R109 Unspecified abdominal pain: Secondary | ICD-10-CM | POA: Insufficient documentation

## 2011-04-12 DIAGNOSIS — K838 Other specified diseases of biliary tract: Secondary | ICD-10-CM | POA: Insufficient documentation

## 2011-04-12 DIAGNOSIS — R140 Abdominal distension (gaseous): Secondary | ICD-10-CM

## 2011-04-12 MED ORDER — SINCALIDE 5 MCG IJ SOLR
INTRAMUSCULAR | Status: AC
  Administered 2011-04-12: 2.3 ug via INTRAVENOUS
  Filled 2011-04-12: qty 5

## 2011-04-12 MED ORDER — SINCALIDE 5 MCG IJ SOLR
0.0200 ug/kg | Freq: Once | INTRAMUSCULAR | Status: AC
  Administered 2011-04-12: 2.3 ug via INTRAVENOUS

## 2011-04-12 MED ORDER — TECHNETIUM TC 99M MEBROFENIN IV KIT
5.0000 | PACK | Freq: Once | INTRAVENOUS | Status: AC | PRN
  Administered 2011-04-12: 5.4 via INTRAVENOUS

## 2011-09-08 ENCOUNTER — Encounter: Payer: Self-pay | Admitting: *Deleted

## 2011-09-08 ENCOUNTER — Encounter: Payer: Medicare Other | Attending: Family Medicine | Admitting: *Deleted

## 2011-09-08 VITALS — Ht 64.0 in | Wt 255.7 lb

## 2011-09-08 DIAGNOSIS — E669 Obesity, unspecified: Secondary | ICD-10-CM

## 2011-09-08 DIAGNOSIS — I10 Essential (primary) hypertension: Secondary | ICD-10-CM | POA: Insufficient documentation

## 2011-09-08 DIAGNOSIS — Z713 Dietary counseling and surveillance: Secondary | ICD-10-CM | POA: Insufficient documentation

## 2011-09-08 NOTE — Progress Notes (Signed)
Medical Nutrition Therapy:  Appt start time: 0945 end time:  1045.  Assessment:  Primary concerns today: patietn here for assistance with weight loss and hypertension. 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 She plans to retire in 6 months after working there 47 years! She enjoys being active at work as well as in her yard and with her large family.   MEDICATIONS: see list   DIETARY INTAKE:  Usual eating pattern includes 2 meals and 1-2 snacks per day.  Everyday foods include fair variety of most food groups.  Avoided foods include dairy milk, fried foods, sweets.    24-hr recall:  B (10 AM): cheerios with [place]k milk OR egg and bacon bagel sandwich, occasionally OJ, coffee with non dairy creamer Snk ( AM): none except  [place] or Activia yogurt  L (4  PM): left overs OR vegetable steamers with lean meat with water Snk ( PM): none or yogurt D ( PM): none Snk ( PM): none Beverages: coffee, water  Usual physical activity: enjoys being active at work, yard work, walking with family as able. Hasn't been able to be active due to being laid off and feeling more tired than usual  Estimated energy needs: 1200 calories 135 g carbohydrates 90 g protein 33 g fat  Progress Towards Goal(s):  In progress.   Nutritional Diagnosis:  NI-1.5 Excessive energy intake As related to current activity level.  As evidenced by BMI of 44%.    Intervention:  Nutrition counseling provided for weight loss using Carb Counting. Patient expresses good verbal understanding of need for sodium restriction for HTN. Plan: Continue with good variety of food groups and low fat meats Continue with yogurt snacks in between meals Continue with pedal and arm exercises every day 15-30 minutes, consider adding weights with canned foods (1-3 pounds max) Continue with water intake Read Food Labels for Total Carbohydrate (15 grams is a serving) and Fat Grams (5 grams is a serving)  Handouts  given during visit include:  Carb Counting and reading food labels  Meal planning card  Monitoring/Evaluation:  Dietary intake, exercise, reading food labels, and body weight prn.

## 2011-09-08 NOTE — Patient Instructions (Addendum)
Plan: Continue with good variety of food groups and low fat meats Continue with yogurt snacks in between meals Continue with pedal and arm exercises every day 15-30 minutes, consider adding weights with canned foods (1-3 pounds max) Continue with water intake Read Food Labels for Total Carbohydrate (15 grams is a serving) and Fat Grams (5 grams is a serving)

## 2012-02-07 ENCOUNTER — Other Ambulatory Visit: Payer: Self-pay | Admitting: Family Medicine

## 2012-02-07 DIAGNOSIS — Z139 Encounter for screening, unspecified: Secondary | ICD-10-CM

## 2012-03-12 ENCOUNTER — Ambulatory Visit (HOSPITAL_COMMUNITY)
Admission: RE | Admit: 2012-03-12 | Discharge: 2012-03-12 | Disposition: A | Discharge status: Home / Self Care | Payer: Medicare Other | Source: Ambulatory Visit | Attending: Family Medicine | Admitting: Family Medicine

## 2012-03-12 DIAGNOSIS — Z139 Encounter for screening, unspecified: Secondary | ICD-10-CM

## 2012-03-12 DIAGNOSIS — Z1231 Encounter for screening mammogram for malignant neoplasm of breast: Secondary | ICD-10-CM | POA: Insufficient documentation

## 2012-04-16 ENCOUNTER — Ambulatory Visit (HOSPITAL_COMMUNITY)
Admission: RE | Admit: 2012-04-16 | Discharge: 2012-04-16 | Disposition: A | Discharge status: Home / Self Care | Payer: Medicare Other | Source: Ambulatory Visit | Attending: Emergency Medicine | Admitting: Emergency Medicine

## 2012-04-16 ENCOUNTER — Encounter (HOSPITAL_COMMUNITY): Payer: Self-pay | Admitting: Emergency Medicine

## 2012-04-16 ENCOUNTER — Emergency Department (HOSPITAL_COMMUNITY)
Admission: EM | Admit: 2012-04-16 | Discharge: 2012-04-16 | Disposition: A | Discharge status: Home / Self Care | Payer: Medicare Other | Source: Home / Self Care | Attending: Emergency Medicine | Admitting: Emergency Medicine

## 2012-04-16 DIAGNOSIS — M25569 Pain in unspecified knee: Secondary | ICD-10-CM | POA: Insufficient documentation

## 2012-04-16 DIAGNOSIS — IMO0002 Reserved for concepts with insufficient information to code with codable children: Secondary | ICD-10-CM | POA: Insufficient documentation

## 2012-04-16 DIAGNOSIS — M1710 Unilateral primary osteoarthritis, unspecified knee: Secondary | ICD-10-CM | POA: Insufficient documentation

## 2012-04-16 DIAGNOSIS — M25561 Pain in right knee: Secondary | ICD-10-CM

## 2012-04-16 MED ORDER — MELOXICAM 7.5 MG PO TABS: 7.5000 mg | ORAL_TABLET | Freq: Every day | ORAL | Status: DC

## 2012-04-16 MED ORDER — HYDROCODONE-ACETAMINOPHEN 5-500 MG PO TABS: 1.0000 | ORAL_TABLET | Freq: Four times a day (QID) | ORAL | Status: DC | PRN

## 2012-04-16 NOTE — ED Provider Notes (Addendum)
History     CSN: [id number]  Arrival date & time 04/16/12  1350   First MD Initiated Contact with Patient 04/16/12 1513      Chief Complaint  Patient presents with  . Knee Pain    (Consider location/radiation/quality/duration/timing/severity/associated sxs/prior treatment) HPI Comments: Patient presents urgent care complaining of right knee pain for about a week. She describes that she was cleaning some leaves the other day and that might have trigger her right knee pain she describes it many years ago she did have some knee surgery " a clean my arthritis from my knee in the 1990s" " I have not had the problem since then". It just started aching, for several days. No recent falls or injuries no increase soft tissue swelling.  Patient is a 71 y.o. female presenting with knee pain. The history is provided by the patient.  Knee Pain This is a new problem. The problem occurs constantly. The problem has not changed since onset.Pertinent negatives include no chest pain and no abdominal pain. The symptoms are aggravated by bending and twisting. Nothing relieves the symptoms. She has tried nothing for the symptoms. The treatment provided no relief.    Past Medical History  Diagnosis Date  . Chest pain   . Dyspepsia   . Hypertension   . Left atrial dilatation     Mild  . Mild aortic sclerosis     without stenosis  . Mitral valve disorder     Mildly thickened mitral valve, but with normal leaflet but with normal leaflet   . Mitral annular calcification     Mild  . Hypercholesterolemia   . Brain tumor   . 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 Gallbladder sludge     Past Surgical History  Procedure Date  . Tubal ligation 1981  . Rt knee surg 1991  . Lt eye implant 1998    Family History  Problem Relation Age of Onset  . Stroke Paternal Grandmother     History  Substance Use Topics  . Smoking status: Former Smoker    Quit date:  09/07/1973  . Smokeless tobacco: Never Used  . Alcohol Use: No    OB History    Grav Para Term Preterm Abortions TAB SAB Ect Mult Living                  Review of Systems  Constitutional: Positive for activity change and appetite change. Negative for fever, chills, diaphoresis, fatigue and unexpected weight change.  Cardiovascular: Negative for chest pain.  Gastrointestinal: Negative for abdominal pain.  Musculoskeletal: Positive for joint swelling. Negative for myalgias, back pain, arthralgias and gait problem.  Skin: Negative for pallor, rash and wound.    Allergies  Vytorin  Home Medications   Current Outpatient Rx  Name  Route  Sig  Dispense  Refill  . AMLODIPINE-VALSARTAN-HCTZ 10-320-25 MG PO TABS   Oral   Take 1 tablet by mouth daily.           Marland Kitchen VITAMIN C 1000 MG PO TABS   Oral   Take 1,000 mg by mouth daily.           . ASPIRIN 81 MG PO CHEW   Oral   Chew 81 mg by mouth daily.           . ATORVASTATIN CALCIUM 80 MG PO TABS   Oral   Take 80 mg by  mouth daily.           Marland Kitchen VITAMIN D3 2000 UNITS PO TABS   Oral   Take 1 capsule by mouth daily.           Marland Kitchen HYDROCODONE-ACETAMINOPHEN 5-500 MG PO TABS   Oral   Take 1-2 tablets by mouth every 6 (six) hours as needed for pain.   15 tablet   0   . MELOXICAM 7.5 MG PO TABS   Oral   Take 1 tablet (7.5 mg total) by mouth daily. Take one tablet daily for 2 weeks   14 tablet   0   . OMEGA-3-ACID ETHYL ESTERS 1 G PO CAPS   Oral   Take 1 g by mouth daily.           Marland Kitchen OMEPRAZOLE 40 MG PO CPDR   Oral   Take 40 mg by mouth daily.           Marland Kitchen VITAMIN B-6 100 MG PO TABS   Oral   Take 100 mg by mouth daily.             BP 192/74  Pulse 90  Temp 98.1 F (36.7 C) (Oral)  Resp 22  SpO2 97%  Physical Exam  Nursing note and vitals reviewed. Constitutional: She is oriented to person, place, and time. Vital signs are normal. She appears well-developed and well-nourished.  Eyes: Conjunctivae  normal are normal.  Abdominal: Soft. She exhibits no distension. There is no tenderness. There is no rebound.  Musculoskeletal: She exhibits tenderness.       Right knee: She exhibits decreased range of motion and swelling. She exhibits no effusion, no ecchymosis, normal alignment, no LCL laxity, normal patellar mobility, no bony tenderness, normal meniscus and no MCL laxity.       Legs: Neurological: She is alert and oriented to person, place, and time.  Skin: No erythema. No pallor.    ED Course  Procedures (including critical care time)  Labs Reviewed - No data to display No results found.   1. Knee pain, right       MDM  Chronic exacerbation of right knee pain. Knee exam does not reveal a increased laxity seems like a stable stable knee. Patient was prescribed a course of meloxicam told to take it for 7 days only and to extend to 2 weeks if no improvement. Was also provided with 15 tablets of Lortab. Encourage her to followup with her primary care Dr. or an orthopedic doctor pain was to persist or unable to bear any weight on it. We have order an x-ray tonight and will followup on the results ( x-ray machine broken at urgent care tonight).       Jimmie Molly, MD 04/16/12 1554  Jimmie Molly, MD 04/16/12 [phone]

## 2012-04-16 NOTE — ED Notes (Signed)
Pt having right knee pain for one week. Pt states she has had problems with this knee in the past, and it is acting up again. Pt has not tried any OTC meds or remedies.

## 2012-07-17 ENCOUNTER — Encounter: Payer: Self-pay | Admitting: Family Medicine

## 2012-09-06 ENCOUNTER — Telehealth: Payer: Self-pay | Admitting: Family Medicine

## 2012-09-06 MED ORDER — FENOFIBRATE 54 MG PO TABS: 54.0000 mg | ORAL_TABLET | Freq: Every day | ORAL | Status: DC

## 2012-09-06 NOTE — Telephone Encounter (Signed)
Rx Refilled  

## 2012-09-24 ENCOUNTER — Ambulatory Visit (INDEPENDENT_AMBULATORY_CARE_PROVIDER_SITE_OTHER): Payer: Medicare Other | Admitting: Family Medicine

## 2012-09-24 ENCOUNTER — Encounter: Payer: Self-pay | Admitting: Family Medicine

## 2012-09-24 VITALS — BP 150/100 | HR 78 | Temp 98.4°F | Resp 20 | Wt 264.0 lb

## 2012-09-24 DIAGNOSIS — E785 Hyperlipidemia, unspecified: Secondary | ICD-10-CM

## 2012-09-24 DIAGNOSIS — I10 Essential (primary) hypertension: Secondary | ICD-10-CM

## 2012-09-24 LAB — BASIC METABOLIC PANEL
Glucose, Bld: 102 mg/dL — ABNORMAL HIGH (ref 70–99)
Potassium: 4.5 mEq/L (ref 3.5–5.3)
Sodium: 144 mEq/L (ref 135–145)

## 2012-09-24 LAB — HEPATIC FUNCTION PANEL
AST: 24 U/L (ref 0–37)
Albumin: 4.4 g/dL (ref 3.5–5.2)
Alkaline Phosphatase: 64 U/L (ref 39–117)
Bilirubin, Direct: 0.1 mg/dL (ref 0.0–0.3)
Total Bilirubin: 0.4 mg/dL (ref 0.3–1.2)

## 2012-09-24 LAB — LIPID PANEL
LDL Cholesterol: 114 mg/dL — ABNORMAL HIGH (ref 0–99)
Total CHOL/HDL Ratio: 3 Ratio
VLDL: 16 mg/dL (ref 0–40)

## 2012-09-24 NOTE — Progress Notes (Signed)
Subjective:    Patient ID: [name], female    DOB: 23-Feb-1941, 72 y.o.   MRN: [id number]  HPI Patient is here today for followup of her hypertension and her hyperlipidemia. She's currently taking Norvasc 10 mg by mouth daily and Hyzaar 100/25 by mouth daily. She states her blood pressure home is 130/80. It is always well controlled at home. She is anxious this morning she has not taken her medications. She like to recheck blood pressure over the next week she had a medicine to it. She denies any chest pain or shortness of breath.  She had a negative stress Myoview in October of 2012.  She is on Lipitor 80 mg by mouth daily and fenofibrate 54 mg by mouth daily for her hyperlipidemia. She does not having any myalgias at all. She has a history of possible PMR. She is off prednisone and doing well. She says she's not had muscle aches in quite sometime.  As the report pain in her left knee due to arthritis. She is requesting a cortisone injection Past Medical History  Diagnosis Date  . Chest pain   . Dyspepsia   . Hypertension   . Left atrial dilatation     Mild  . Mild aortic sclerosis     without stenosis  . Mitral valve disorder     Mildly thickened mitral valve, but with normal leaflet but with normal leaflet   . Mitral annular calcification     Mild  . Hypercholesterolemia   . Brain tumor   . 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 Gallbladder sludge   . Colon polyps   . Arthritis     rheumatoid arthritis   Current Outpatient Prescriptions on File Prior to Visit  Medication Sig Dispense Refill  . amLODipine (NORVASC) 10 MG tablet Take 10 mg by mouth daily.      . Ascorbic Acid (VITAMIN C) 1000 MG tablet Take 1,000 mg by mouth daily.        Marland Kitchen aspirin 81 MG chewable tablet Chew 81 mg by mouth daily.        Marland Kitchen atorvastatin (LIPITOR) 80 MG tablet Take 80 mg by mouth daily.        . Cholecalciferol (VITAMIN D3) 2000 UNITS TABS Take 1  capsule by mouth daily.        . fenofibrate 54 MG tablet Take 1 tablet (54 mg total) by mouth daily.  30 tablet  5  . losartan-hydrochlorothiazide (HYZAAR) 100-25 MG per tablet Take 1 tablet by mouth daily.      Marland Kitchen omega-3 acid ethyl esters (LOVAZA) 1 G capsule Take 1 g by mouth daily.        Marland Kitchen omeprazole (PRILOSEC) 40 MG capsule Take 40 mg by mouth daily.        Marland Kitchen pyridOXINE (VITAMIN B-6) 100 MG tablet Take 100 mg by mouth daily.         No current facility-administered medications on file prior to visit.   Allergies  Allergen Reactions  . Vytorin (Ezetimibe-Simvastatin) Other (See Comments)    myalgia   History   Social History  . Marital Status: Widowed    Spouse Name: N/A    Number of Children: N/A  . Years of Education: N/A   Occupational History  . Not on file.   Social History Main Topics  . Smoking status: Former Smoker    Quit date: 09/07/1973  .  Smokeless tobacco: Never Used  . Alcohol Use: No  . Drug Use: No  . Sexually Active: Not on file   Other Topics Concern  . Not on file   Social History Narrative  . No narrative on file     Review of Systems  All other systems reviewed and are negative.       Objective:   Physical Exam  Cardiovascular: Normal rate, regular rhythm and intact distal pulses.   Murmur heard. Pulmonary/Chest: Effort normal and breath sounds normal. No respiratory distress. She has no wheezes. She has no rales.  Abdominal: Soft. Bowel sounds are normal. She exhibits no distension. There is no tenderness. There is no rebound.  Musculoskeletal:       Right knee: She exhibits bony tenderness. She exhibits normal range of motion and normal meniscus. Tenderness found. Medial joint line tenderness noted. No patellar tendon tenderness noted.       Left upper leg: Normal. She exhibits no tenderness, no bony tenderness and no swelling.          Assessment & Plan:  1. HTN (hypertension) Patient is hesitant to add any medications at  this time. She was to check her blood pressure everyday for one week. She'll see me back in one week. If the blood pressure is persistently elevated, we are going to add atenolol 50 mg by mouth daily.  She agrees to this. - Basic Metabolic Panel  2. HLD (hyperlipidemia) Check CMP and fasting lipid panel. Goal LDL is less than 100 - Hepatic Function Panel - Lipid Panel She has knee pain due to osteoarthritis, we can proceed with cortisone injection at her followup in one-week of her blood pressure better controlled.

## 2012-10-02 ENCOUNTER — Encounter: Payer: Self-pay | Admitting: Family Medicine

## 2012-10-02 ENCOUNTER — Ambulatory Visit (INDEPENDENT_AMBULATORY_CARE_PROVIDER_SITE_OTHER): Payer: Medicare Other | Admitting: Family Medicine

## 2012-10-02 VITALS — BP 150/80 | HR 66 | Temp 98.5°F | Resp 18 | Ht 64.0 in | Wt 264.0 lb

## 2012-10-02 DIAGNOSIS — I10 Essential (primary) hypertension: Secondary | ICD-10-CM

## 2012-10-02 DIAGNOSIS — M1711 Unilateral primary osteoarthritis, right knee: Secondary | ICD-10-CM

## 2012-10-02 DIAGNOSIS — M1710 Unilateral primary osteoarthritis, unspecified knee: Secondary | ICD-10-CM

## 2012-10-02 MED ORDER — ATENOLOL 50 MG PO TABS: 50.0000 mg | ORAL_TABLET | Freq: Every day | ORAL | Status: DC

## 2012-10-02 NOTE — Progress Notes (Signed)
Subjective:    Patient ID: [name], female    DOB: 04/21/1941, 72 y.o.   MRN: [id number]  HPI 09/24/12 Patient is here today for followup of her hypertension and her hyperlipidemia. She's currently taking Norvasc 10 mg by mouth daily and Hyzaar 100/25 by mouth daily. She states her blood pressure home is 130/80. It is always well controlled at home. She is anxious this morning she has not taken her medications. She like to recheck blood pressure over the next week she had a medicine to it. She denies any chest pain or shortness of breath.  She had a negative stress Myoview in October of 2012.  She is on Lipitor 80 mg by mouth daily and fenofibrate 54 mg by mouth daily for her hyperlipidemia. She does not having any myalgias at all. She has a history of possible PMR. She is off prednisone and doing well. She says she's not had muscle aches in quite sometime.  As the report pain in her left knee due to arthritis. She is requesting a cortisone injection  Therefore, I diagnosed her with: 1. HTN (hypertension) Patient is hesitant to add any medications at this time. She was to check her blood pressure everyday for one week. She'll see me back in one week. If the blood pressure is persistently elevated, we are going to add atenolol 50 mg by mouth daily.  She agrees to this. - Basic Metabolic Panel  2. HLD (hyperlipidemia) Check CMP and fasting lipid panel. Goal LDL is less than 100 - Hepatic Function Panel - Lipid Panel She has knee pain due to osteoarthritis, we can proceed with cortisone injection at her followup in one-week of her blood pressure better controlled.  10/02/12 Her blood pressure at home has been ranging 140-160/90.  She denies chest pain or shortness of breath. She continues to complain of pain in her right knee it is worse with walking and standing. It is over both joint lines now. History of osteoarthritis. Past Medical History  Diagnosis Date  . Chest pain   .  Dyspepsia   . Hypertension   . Left atrial dilatation     Mild  . Mild aortic sclerosis     without stenosis  . Mitral valve disorder     Mildly thickened mitral valve, but with normal leaflet but with normal leaflet   . Mitral annular calcification     Mild  . Hypercholesterolemia   . Brain tumor   . 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 Gallbladder sludge   . Colon polyps   . Arthritis     rheumatoid arthritis   Current Outpatient Prescriptions on File Prior to Visit  Medication Sig Dispense Refill  . amLODipine (NORVASC) 10 MG tablet Take 10 mg by mouth daily.      . Ascorbic Acid (VITAMIN C) 1000 MG tablet Take 1,000 mg by mouth daily.        Marland Kitchen aspirin 81 MG chewable tablet Chew 81 mg by mouth daily.        Marland Kitchen atorvastatin (LIPITOR) 80 MG tablet Take 80 mg by mouth daily.        . bifidobacterium infantis (ALIGN) capsule Take 1 capsule by mouth daily.      . Cholecalciferol (VITAMIN D3) 2000 UNITS TABS Take 1 capsule by mouth daily.        . Coenzyme Q10 (CO Q-10) 100 MG CAPS Take by  mouth.      . fenofibrate 54 MG tablet Take 1 tablet (54 mg total) by mouth daily.  30 tablet  5  . losartan-hydrochlorothiazide (HYZAAR) 100-25 MG per tablet Take 1 tablet by mouth daily.      Marland Kitchen omega-3 acid ethyl esters (LOVAZA) 1 G capsule Take 1 g by mouth daily.        Marland Kitchen omeprazole (PRILOSEC) 40 MG capsule Take 40 mg by mouth daily.        Marland Kitchen pyridOXINE (VITAMIN B-6) 100 MG tablet Take 100 mg by mouth daily.         No current facility-administered medications on file prior to visit.   Allergies  Allergen Reactions  . Vytorin (Ezetimibe-Simvastatin) Other (See Comments)    myalgia   History   Social History  . Marital Status: Widowed    Spouse Name: N/A    Number of Children: N/A  . Years of Education: N/A   Occupational History  . Not on file.   Social History Main Topics  . Smoking status: Former Smoker    Quit date: 09/07/1973  .  Smokeless tobacco: Never Used  . Alcohol Use: No  . Drug Use: No  . Sexually Active: Not on file   Other Topics Concern  . Not on file   Social History Narrative  . No narrative on file     Review of Systems  All other systems reviewed and are negative.       Objective:   Physical Exam  Cardiovascular: Normal rate, regular rhythm and intact distal pulses.   Murmur heard. Pulmonary/Chest: Effort normal and breath sounds normal. No respiratory distress. She has no wheezes. She has no rales.  Abdominal: Soft. Bowel sounds are normal. She exhibits no distension. There is no tenderness. There is no rebound.  Musculoskeletal:       Right knee: She exhibits bony tenderness. She exhibits normal range of motion and normal meniscus. Tenderness found. Medial joint line tenderness noted. No patellar tendon tenderness noted.       Left upper leg: Normal. She exhibits no tenderness, no bony tenderness and no swelling.          Assessment & Plan:  1. HTN (hypertension) At atenolol. Recheck blood pressure in 2 weeks. Warned about bradycardia. She is to call me if she feels dizzy or experiences presyncope. - atenolol (TENORMIN) 50 MG tablet; Take 1 tablet (50 mg total) by mouth daily.  Dispense: 30 tablet; Refill: 3  2. Osteoarthritis of right knee Using sterile technique I injected the right knee with a mixture of 2 cc of 0.1% lidocaine, 2 cc of Marcaine, and 2 cc of 40 mg per mL Kenalog. The patient tolerated the procedure well without complication.

## 2012-10-08 ENCOUNTER — Other Ambulatory Visit: Payer: Self-pay | Admitting: Family Medicine

## 2012-10-14 ENCOUNTER — Other Ambulatory Visit: Payer: Self-pay | Admitting: Family Medicine

## 2012-10-15 ENCOUNTER — Other Ambulatory Visit: Payer: Self-pay | Admitting: Family Medicine

## 2012-10-16 ENCOUNTER — Other Ambulatory Visit: Payer: Self-pay | Admitting: Family Medicine

## 2012-10-22 ENCOUNTER — Other Ambulatory Visit: Payer: Self-pay | Admitting: Family Medicine

## 2013-01-24 ENCOUNTER — Other Ambulatory Visit: Payer: Self-pay | Admitting: Family Medicine

## 2013-02-04 ENCOUNTER — Ambulatory Visit (INDEPENDENT_AMBULATORY_CARE_PROVIDER_SITE_OTHER): Payer: Medicare Other | Admitting: Family Medicine

## 2013-02-04 DIAGNOSIS — Z23 Encounter for immunization: Secondary | ICD-10-CM

## 2013-02-11 ENCOUNTER — Ambulatory Visit (INDEPENDENT_AMBULATORY_CARE_PROVIDER_SITE_OTHER): Payer: Medicare Other | Admitting: Family Medicine

## 2013-02-11 ENCOUNTER — Encounter: Payer: Self-pay | Admitting: Family Medicine

## 2013-02-11 VITALS — BP 130/78 | HR 68 | Temp 98.5°F | Resp 18 | Ht 64.0 in | Wt 264.0 lb

## 2013-02-11 DIAGNOSIS — E785 Hyperlipidemia, unspecified: Secondary | ICD-10-CM

## 2013-02-11 DIAGNOSIS — Z23 Encounter for immunization: Secondary | ICD-10-CM

## 2013-02-11 DIAGNOSIS — I10 Essential (primary) hypertension: Secondary | ICD-10-CM

## 2013-02-11 LAB — LIPID PANEL
Total CHOL/HDL Ratio: 4.4 Ratio
VLDL: 21 mg/dL (ref 0–40)

## 2013-02-11 LAB — COMPLETE METABOLIC PANEL WITH GFR
ALT: 27 U/L (ref 0–35)
AST: 25 U/L (ref 0–37)
Albumin: 4.3 g/dL (ref 3.5–5.2)
Alkaline Phosphatase: 44 U/L (ref 39–117)
Potassium: 4.6 mEq/L (ref 3.5–5.3)
Sodium: 140 mEq/L (ref 135–145)
Total Protein: 7.7 g/dL (ref 6.0–8.3)

## 2013-02-11 NOTE — Progress Notes (Signed)
Subjective:    Patient ID: [name], female    DOB: [birth year], 72 y.o.   MRN: [id number]  HPI Patient is here for followup of her hypertension or hyperlipidemia. She is due for a mammogram this year and she is on schedule MRI and. She has had her flu shot one week ago. Her last pneumonia shot was 8 years ago and she is due again for the booster. She like to get this today. Otherwise she is doing well. Her blood pressure is 130/78 and well controlled. She's currently taking atenolol 50 mg by mouth daily and Hyzaar 100/25 one by mouth daily. Chest has hyperlipidemia. She's taking Lipitor 40 mg by mouth daily and fenofibrate 54 mg by mouth daily. She is due check a fasting lipid panel. She denies any myalgias at the present time. She's taking aspirin 81 mg by mouth daily. She's interested in possibly stopping the fenofibrate. She also questions whether she needs Metrazol. She denies any gastroesophageal reflux. She denies any indigestion. She denies any melena or stomach discomfort.. Past Medical History  Diagnosis Date  . Chest pain   . Dyspepsia   . Hypertension   . Left atrial dilatation     Mild  . Mild aortic sclerosis     without stenosis  . Mitral valve disorder     Mildly thickened mitral valve, but with normal leaflet but with normal leaflet   . Mitral annular calcification     Mild  . Hypercholesterolemia   . Brain tumor   . 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 Gallbladder sludge   . Colon polyps   . Arthritis     rheumatoid arthritis   Past Surgical History  Procedure Laterality Date  . Tubal ligation  1981  . Rt knee surg  1991  . Lt eye implant  1998   Current Outpatient Prescriptions on File Prior to Visit  Medication Sig Dispense Refill  . amLODipine (NORVASC) 10 MG tablet TAKE 1 TABLET BY MOUTH EVERY DAY  30 tablet  5  . Ascorbic Acid (VITAMIN C) 1000 MG tablet Take 1,000 mg by mouth daily.        Marland Kitchen aspirin 81 MG  chewable tablet Chew 81 mg by mouth daily.        Marland Kitchen atenolol (TENORMIN) 50 MG tablet TAKE 1 TABLET (50 MG TOTAL) BY MOUTH DAILY.  30 tablet  3  . atorvastatin (LIPITOR) 80 MG tablet Take 80 mg by mouth daily.        . bifidobacterium infantis (ALIGN) capsule Take 1 capsule by mouth daily.      . Cholecalciferol (VITAMIN D3) 2000 UNITS TABS Take 1 capsule by mouth daily.        . Coenzyme Q10 (CO Q-10) 100 MG CAPS Take by mouth.      . fenofibrate 54 MG tablet Take 1 tablet (54 mg total) by mouth daily.  30 tablet  5  . losartan-hydrochlorothiazide (HYZAAR) 100-25 MG per tablet TAKE 1 TABLET BY MOUTH EVERY DAY  30 tablet  5  . omega-3 acid ethyl esters (LOVAZA) 1 G capsule Take 1 g by mouth daily.        Marland Kitchen omeprazole (PRILOSEC) 40 MG capsule TAKE ONE CAPSULE BY MOUTH EVERY MORNING  30 capsule  10  . pyridOXINE (VITAMIN B-6) 100 MG tablet Take 100 mg by mouth daily.         No  current facility-administered medications on file prior to visit.   Allergies  Allergen Reactions  . Vytorin [Ezetimibe-Simvastatin] Other (See Comments)    myalgia   History   Social History  . Marital Status: Widowed    Spouse Name: N/A    Number of Children: N/A  . Years of Education: N/A   Occupational History  . Not on file.   Social History Main Topics  . Smoking status: Former Smoker    Quit date: 09/07/1973  . Smokeless tobacco: Never Used  . Alcohol Use: No  . Drug Use: No  . Sexual Activity: Not on file   Other Topics Concern  . Not on file   Social History Narrative  . No narrative on file      Review of Systems  All other systems reviewed and are negative.       Objective:   Physical Exam  Vitals reviewed. Constitutional: She is oriented to person, place, and time. She appears well-developed and well-nourished.  Neck: Neck supple. No JVD present. No thyromegaly present.  Cardiovascular: Normal rate, regular rhythm and normal heart sounds.  Exam reveals no gallop and no  friction rub.   No murmur heard. Pulmonary/Chest: Effort normal and breath sounds normal. No respiratory distress. She has no wheezes. She has no rales.  Abdominal: Soft. Bowel sounds are normal. She exhibits no distension and no mass. There is no tenderness. There is no rebound and no guarding.  Musculoskeletal: She exhibits no edema.  Lymphadenopathy:    She has no cervical adenopathy.  Neurological: She is alert and oriented to person, place, and time. No cranial nerve deficit. Coordination normal.          Assessment & Plan:  1. HLD (hyperlipidemia) Check fasting lipid panel. LDL cholesterol is less than 130. If her LDL is well 130 and her triglycerides are controlled stop fenofibrate.  I am also gave her discontinuing omeprazole. However if she develops daily ingestion I asked her to call me so we can resume the medication. - COMPLETE METABOLIC PANEL WITH GFR - Lipid panel  2. HTN (hypertension) Blood pressures well controlled today. Continue present medication at the current dosages. Also abated the patient's ammonia vaccine today in clinic. Recommended diet exercise and weight loss given her morbid obesity.

## 2013-02-11 NOTE — Addendum Note (Signed)
Addended by: Legrand Rams B on: 02/11/2013 10:12 AM   Modules accepted: Orders

## 2013-02-13 ENCOUNTER — Encounter: Payer: Self-pay | Admitting: Cardiovascular Disease

## 2013-02-21 ENCOUNTER — Other Ambulatory Visit: Payer: Self-pay | Admitting: Family Medicine

## 2013-02-21 DIAGNOSIS — Z139 Encounter for screening, unspecified: Secondary | ICD-10-CM

## 2013-03-04 ENCOUNTER — Other Ambulatory Visit: Payer: Self-pay | Admitting: Family Medicine

## 2013-03-04 NOTE — Telephone Encounter (Signed)
Medication refilled per protocol. 

## 2013-03-06 ENCOUNTER — Other Ambulatory Visit: Payer: Self-pay | Admitting: Family Medicine

## 2013-03-13 ENCOUNTER — Ambulatory Visit (HOSPITAL_COMMUNITY)
Admission: RE | Admit: 2013-03-13 | Discharge: 2013-03-13 | Disposition: A | Discharge status: Home / Self Care | Payer: Medicare Other | Source: Ambulatory Visit | Attending: Family Medicine | Admitting: Family Medicine

## 2013-03-13 DIAGNOSIS — Z1231 Encounter for screening mammogram for malignant neoplasm of breast: Secondary | ICD-10-CM | POA: Insufficient documentation

## 2013-03-13 DIAGNOSIS — Z139 Encounter for screening, unspecified: Secondary | ICD-10-CM

## 2013-04-01 ENCOUNTER — Other Ambulatory Visit: Payer: Self-pay | Admitting: Family Medicine

## 2013-04-04 ENCOUNTER — Telehealth: Payer: Self-pay | Admitting: Family Medicine

## 2013-04-04 NOTE — Telephone Encounter (Signed)
Error

## 2013-08-07 ENCOUNTER — Ambulatory Visit (INDEPENDENT_AMBULATORY_CARE_PROVIDER_SITE_OTHER): Payer: Commercial Managed Care - HMO | Admitting: Family Medicine

## 2013-08-07 ENCOUNTER — Encounter: Payer: Self-pay | Admitting: Family Medicine

## 2013-08-07 VITALS — BP 154/78 | HR 78 | Temp 98.6°F | Resp 18 | Ht 64.0 in | Wt 266.0 lb

## 2013-08-07 DIAGNOSIS — E785 Hyperlipidemia, unspecified: Secondary | ICD-10-CM

## 2013-08-07 DIAGNOSIS — I10 Essential (primary) hypertension: Secondary | ICD-10-CM

## 2013-08-07 LAB — CBC WITH DIFFERENTIAL/PLATELET
BASOS PCT: 0 % (ref 0–1)
Basophils Absolute: 0 10*3/uL (ref 0.0–0.1)
Eosinophils Absolute: 0.2 10*3/uL (ref 0.0–0.7)
Eosinophils Relative: 2 % (ref 0–5)
HEMATOCRIT: 37.8 % (ref 36.0–46.0)
HEMOGLOBIN: 13.2 g/dL (ref 12.0–15.0)
Lymphocytes Relative: 51 % — ABNORMAL HIGH (ref 12–46)
Lymphs Abs: 3.9 10*3/uL (ref 0.7–4.0)
MCH: 26.6 pg (ref 26.0–34.0)
MCHC: 34.9 g/dL (ref 30.0–36.0)
MCV: 76.2 fL — AB (ref 78.0–100.0)
MONO ABS: 0.5 10*3/uL (ref 0.1–1.0)
MONOS PCT: 7 % (ref 3–12)
NEUTROS ABS: 3 10*3/uL (ref 1.7–7.7)
Neutrophils Relative %: 40 % — ABNORMAL LOW (ref 43–77)
Platelets: 296 10*3/uL (ref 150–400)
RBC: 4.96 MIL/uL (ref 3.87–5.11)
RDW: 15.6 % — ABNORMAL HIGH (ref 11.5–15.5)
WBC: 7.6 10*3/uL (ref 4.0–10.5)

## 2013-08-07 LAB — COMPLETE METABOLIC PANEL WITH GFR
ALBUMIN: 4.4 g/dL (ref 3.5–5.2)
ALK PHOS: 53 U/L (ref 39–117)
ALT: 25 U/L (ref 0–35)
AST: 25 U/L (ref 0–37)
BUN: 14 mg/dL (ref 6–23)
CO2: 27 mEq/L (ref 19–32)
Calcium: 9.9 mg/dL (ref 8.4–10.5)
Chloride: 102 mEq/L (ref 96–112)
Creat: 0.85 mg/dL (ref 0.50–1.10)
GFR, Est African American: 79 mL/min
GFR, Est Non African American: 69 mL/min
Glucose, Bld: 101 mg/dL — ABNORMAL HIGH (ref 70–99)
POTASSIUM: 4 meq/L (ref 3.5–5.3)
SODIUM: 138 meq/L (ref 135–145)
TOTAL PROTEIN: 7.7 g/dL (ref 6.0–8.3)
Total Bilirubin: 0.6 mg/dL (ref 0.2–1.2)

## 2013-08-07 LAB — LIPID PANEL
CHOL/HDL RATIO: 3.1 ratio
Cholesterol: 178 mg/dL (ref 0–200)
HDL: 58 mg/dL (ref >39)
LDL Cholesterol: 107 mg/dL — ABNORMAL HIGH (ref 0–99)
Triglycerides: 66 mg/dL (ref <150)
VLDL: 13 mg/dL (ref 0–40)

## 2013-08-07 MED ORDER — DOXAZOSIN MESYLATE 4 MG PO TABS: 4.0000 mg | ORAL_TABLET | Freq: Every day | ORAL | Status: DC

## 2013-08-07 NOTE — Progress Notes (Signed)
Subjective:    Patient ID: [name], female    DOB: [birth year], 73 y.o.   MRN: [id number]  HPI Patient is here today for followup of her hypertension and hyperlipidemia. Her blood pressure is elevated today at 154/78. Her ophthalmologist office it was also elevated 164/80. She's not been having any chest pain or shortness of breath or dyspnea on exertion. She is on maximum dose amlodipine, Hyzaar. She is on low-dose atenolol but her heart rate ranges in the 60s to 70s. She denies any syncope or palpitations. She also has a history of hyperlipidemia. In the past she had severe myalgias.  At present she is taking fenofibrate and Lipitor for her cholesterol and she denies any myalgias.   Past Medical History  Diagnosis Date  . Chest pain   . Dyspepsia   . Hypertension   . Left atrial dilatation     Mild  . Mild aortic sclerosis     without stenosis  . Mitral valve disorder     Mildly thickened mitral valve, but with normal leaflet but with normal leaflet   . Mitral annular calcification     Mild  . Hypercholesterolemia   . Brain tumor   . 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 Gallbladder sludge   . Colon polyps   . Arthritis     rheumatoid arthritis   Past Surgical History  Procedure Laterality Date  . Tubal ligation  1981  . Rt knee surg  1991  . Lt eye implant  1998   Current Outpatient Prescriptions on File Prior to Visit  Medication Sig Dispense Refill  . amLODipine (NORVASC) 10 MG tablet TAKE 1 TABLET BY MOUTH EVERY DAY  30 tablet  5  . Ascorbic Acid (VITAMIN C) 1000 MG tablet Take 1,000 mg by mouth daily.        Marland Kitchen aspirin 81 MG chewable tablet Chew 81 mg by mouth daily.        Marland Kitchen atenolol (TENORMIN) 50 MG tablet TAKE 1 TABLET (50 MG TOTAL) BY MOUTH DAILY.  30 tablet  3  . atorvastatin (LIPITOR) 80 MG tablet TAKE 1/2 TABLET BY MOUTH DAILY  30 tablet  4  . bifidobacterium infantis (ALIGN) capsule Take 1 capsule by mouth  daily.      . Coenzyme Q10 (CO Q-10) 100 MG CAPS Take by mouth.      . fenofibrate 54 MG tablet TAKE 1 TABLET (54 MG TOTAL) BY MOUTH DAILY.  30 tablet  5  . losartan-hydrochlorothiazide (HYZAAR) 100-25 MG per tablet TAKE 1 TABLET BY MOUTH EVERY DAY  30 tablet  5  . omeprazole (PRILOSEC) 40 MG capsule TAKE ONE CAPSULE BY MOUTH EVERY MORNING  30 capsule  10  . pyridOXINE (VITAMIN B-6) 100 MG tablet Take 100 mg by mouth daily as needed.        No current facility-administered medications on file prior to visit.   Allergies  Allergen Reactions  . Vytorin [Ezetimibe-Simvastatin] Other (See Comments)    myalgia   History   Social History  . Marital Status: Widowed    Spouse Name: N/A    Number of Children: N/A  . Years of Education: N/A   Occupational History  . Not on file.   Social History Main Topics  . Smoking status: Former Smoker    Quit date: 09/07/1973  . Smokeless tobacco: Never Used  . Alcohol Use: No  .  Drug Use: No  . Sexual Activity: Not on file   Other Topics Concern  . Not on file   Social History Narrative  . No narrative on file      Review of Systems  All other systems reviewed and are negative.       Objective:   Physical Exam  Vitals reviewed. HENT:  Nose: Nose normal.  Mouth/Throat: Oropharynx is clear and moist.  Eyes: Conjunctivae are normal. No scleral icterus.  Neck: Neck supple. No JVD present. No thyromegaly present.  Cardiovascular: Normal rate, regular rhythm, normal heart sounds and intact distal pulses.  Exam reveals no gallop and no friction rub.   No murmur heard. Pulmonary/Chest: Effort normal and breath sounds normal. No respiratory distress. She has no wheezes. She has no rales.  Abdominal: Soft. Bowel sounds are normal. She exhibits no distension. There is no tenderness. There is no rebound and no guarding.  Musculoskeletal: She exhibits no edema.  Lymphadenopathy:    She has no cervical adenopathy.            Assessment & Plan:  1. HTN (hypertension) Patient blood pressure is elevated today. I will add doxazosin 4 mg by mouth daily. Recheck blood pressure in 2 weeks. Also check a CMP. - doxazosin (CARDURA) 4 MG tablet; Take 1 tablet (4 mg total) by mouth daily.  Dispense: 30 tablet; Refill: 5 - COMPLETE METABOLIC PANEL WITH GFR - Lipid panel - CBC with Differential  2. HLD (hyperlipidemia) Check fasting lipid panel. Patient's goal LDL is less than 130.

## 2013-08-08 ENCOUNTER — Other Ambulatory Visit: Payer: Self-pay | Admitting: Family Medicine

## 2013-08-08 DIAGNOSIS — Z79899 Other long term (current) drug therapy: Secondary | ICD-10-CM

## 2013-08-21 ENCOUNTER — Ambulatory Visit (INDEPENDENT_AMBULATORY_CARE_PROVIDER_SITE_OTHER): Payer: Commercial Managed Care - HMO | Admitting: Family Medicine

## 2013-08-21 ENCOUNTER — Other Ambulatory Visit: Payer: Self-pay | Admitting: Family Medicine

## 2013-08-21 ENCOUNTER — Encounter: Payer: Self-pay | Admitting: Family Medicine

## 2013-08-21 VITALS — BP 130/74 | HR 68 | Temp 98.3°F | Resp 18 | Ht 64.0 in | Wt 267.0 lb

## 2013-08-21 DIAGNOSIS — I10 Essential (primary) hypertension: Secondary | ICD-10-CM

## 2013-08-21 NOTE — Progress Notes (Signed)
Subjective:    Patient ID: [name], female    DOB: Jun 01, 1940, 73 y.o.   MRN: [id number]  HPI 08/07/13 Patient is here today for followup of her hypertension and hyperlipidemia. Her blood pressure is elevated today at 154/78. Her ophthalmologist office it was also elevated 164/80. She's not been having any chest pain or shortness of breath or dyspnea on exertion. She is on maximum dose amlodipine, Hyzaar. She is on low-dose atenolol but her heart rate ranges in the 60s to 70s. She denies any syncope or palpitations. She also has a history of hyperlipidemia. In the past she had severe myalgias.  At present she is taking fenofibrate and Lipitor for her cholesterol and she denies any myalgias.  At that time, my plan was: 1. HTN (hypertension) Patient blood pressure is elevated today. I will add doxazosin 4 mg by mouth daily. Recheck blood pressure in 2 weeks. Also check a CMP. - doxazosin (CARDURA) 4 MG tablet; Take 1 tablet (4 mg total) by mouth daily.  Dispense: 30 tablet; Refill: 5 - COMPLETE METABOLIC PANEL WITH GFR - Lipid panel - CBC with Differential  2. HLD (hyperlipidemia) Check fasting lipid panel. Patient's goal LDL is less than 130.  08/21/13 Patient is here today for a recheck of her blood pressure.  It is excellent at 130/74.  Unfortunately the patient feels extremely dizzy when she takes the doxazosin. She has not passed out but it makes her feel very tired and lightheaded. She has not taken her medication today. She denies any chest pain or shortness of breath or dyspnea on exertion  Past Medical History  Diagnosis Date  . Chest pain   . Dyspepsia   . Hypertension   . Left atrial dilatation     Mild  . Mild aortic sclerosis     without stenosis  . Mitral valve disorder     Mildly thickened mitral valve, but with normal leaflet but with normal leaflet   . Mitral annular calcification     Mild  . Hypercholesterolemia   . Brain tumor   . Tumor of the white part  of the eye     Eye tumor being followed at [facility]  . Gallbladder sludge   . Colon polyps   . Arthritis     rheumatoid arthritis   Past Surgical History  Procedure Laterality Date  . Tubal ligation  1981  . Rt knee surg  1991  . Lt eye implant  1998   Current Outpatient Prescriptions on File Prior to Visit  Medication Sig Dispense Refill  . amLODipine (NORVASC) 10 MG tablet TAKE 1 TABLET BY MOUTH EVERY DAY  30 tablet  5  . Ascorbic Acid (VITAMIN C) 1000 MG tablet Take 1,000 mg by mouth daily.        Marland Kitchen aspirin 81 MG chewable tablet Chew 81 mg by mouth daily.        Marland Kitchen atenolol (TENORMIN) 50 MG tablet TAKE 1 TABLET (50 MG TOTAL) BY MOUTH DAILY.  30 tablet  3  . atorvastatin (LIPITOR) 80 MG tablet TAKE 1/2 TABLET BY MOUTH DAILY  30 tablet  4  . bifidobacterium infantis (ALIGN) capsule Take 1 capsule by mouth daily.      . Cholecalciferol (VITAMIN D) 2000 UNITS tablet Take 2,000 Units by mouth daily.      . Coenzyme Q10 (CO Q-10) 100 MG CAPS Take by mouth.      . doxazosin (CARDURA) 4 MG tablet Take 1 tablet (4  mg total) by mouth daily.  30 tablet  5  . losartan-hydrochlorothiazide (HYZAAR) 100-25 MG per tablet TAKE 1 TABLET BY MOUTH EVERY DAY  30 tablet  5  . omeprazole (PRILOSEC) 40 MG capsule TAKE ONE CAPSULE BY MOUTH EVERY MORNING  30 capsule  10  . Polyethyl Glycol-Propyl Glycol (SYSTANE) 0.4-0.3 % SOLN Apply to eye.      . pyridOXINE (VITAMIN B-6) 100 MG tablet Take 100 mg by mouth daily as needed.        No current facility-administered medications on file prior to visit.   Allergies  Allergen Reactions  . Vytorin [Ezetimibe-Simvastatin] Other (See Comments)    myalgia   History   Social History  . Marital Status: Widowed    Spouse Name: N/A    Number of Children: N/A  . Years of Education: N/A   Occupational History  . Not on file.   Social History Main Topics  . Smoking status: Former Smoker    Quit date: 09/07/1973  . Smokeless tobacco: Never Used  . Alcohol  Use: No  . Drug Use: No  . Sexual Activity: Not on file   Other Topics Concern  . Not on file   Social History Narrative  . No narrative on file      Review of Systems  All other systems reviewed and are negative.      Objective:   Physical Exam  Vitals reviewed. HENT:  Nose: Nose normal.  Mouth/Throat: Oropharynx is clear and moist.  Eyes: Conjunctivae are normal. No scleral icterus.  Neck: Neck supple. No JVD present. No thyromegaly present.  Cardiovascular: Normal rate, regular rhythm, normal heart sounds and intact distal pulses.  Exam reveals no gallop and no friction rub.   No murmur heard. Pulmonary/Chest: Effort normal and breath sounds normal. No respiratory distress. She has no wheezes. She has no rales.  Abdominal: Soft. Bowel sounds are normal. She exhibits no distension. There is no tenderness. There is no rebound and no guarding.  Musculoskeletal: She exhibits no edema.  Lymphadenopathy:    She has no cervical adenopathy.          Assessment & Plan:  1. HTN (hypertension) Discontinue doxazosin.  Check blood pressure daily at home. If blood pressure rises above 140/90, I would resume Cardura but only 1 mg a day.  I gave written instructions to the patient. She expresses understanding. She will call me in 2 weeks with her blood pressures.

## 2013-09-16 ENCOUNTER — Other Ambulatory Visit: Payer: Self-pay | Admitting: Family Medicine

## 2013-10-10 ENCOUNTER — Other Ambulatory Visit: Payer: Self-pay | Admitting: Family Medicine

## 2013-11-20 ENCOUNTER — Encounter: Payer: Self-pay | Admitting: Family Medicine

## 2013-11-20 ENCOUNTER — Ambulatory Visit (INDEPENDENT_AMBULATORY_CARE_PROVIDER_SITE_OTHER): Payer: Commercial Managed Care - HMO | Admitting: Family Medicine

## 2013-11-20 VITALS — BP 110/68 | HR 78 | Temp 97.7°F | Resp 18 | Ht 64.0 in | Wt 268.0 lb

## 2013-11-20 DIAGNOSIS — I10 Essential (primary) hypertension: Secondary | ICD-10-CM

## 2013-11-20 DIAGNOSIS — E785 Hyperlipidemia, unspecified: Secondary | ICD-10-CM

## 2013-11-20 DIAGNOSIS — Z1211 Encounter for screening for malignant neoplasm of colon: Secondary | ICD-10-CM

## 2013-11-20 LAB — COMPLETE METABOLIC PANEL WITH GFR
ALT: 21 U/L (ref 0–35)
AST: 21 U/L (ref 0–37)
Albumin: 4.5 g/dL (ref 3.5–5.2)
Alkaline Phosphatase: 48 U/L (ref 39–117)
BILIRUBIN TOTAL: 0.3 mg/dL (ref 0.2–1.2)
BUN: 22 mg/dL (ref 6–23)
CO2: 25 meq/L (ref 19–32)
Calcium: 10.1 mg/dL (ref 8.4–10.5)
Chloride: 104 mEq/L (ref 96–112)
Creat: 0.81 mg/dL (ref 0.50–1.10)
GFR, EST AFRICAN AMERICAN: 84 mL/min
GFR, EST NON AFRICAN AMERICAN: 73 mL/min
Glucose, Bld: 101 mg/dL — ABNORMAL HIGH (ref 70–99)
Potassium: 3.9 mEq/L (ref 3.5–5.3)
Sodium: 138 mEq/L (ref 135–145)
Total Protein: 7.5 g/dL (ref 6.0–8.3)

## 2013-11-20 LAB — CBC WITH DIFFERENTIAL/PLATELET
Basophils Absolute: 0 10*3/uL (ref 0.0–0.1)
Basophils Relative: 0 % (ref 0–1)
EOS PCT: 2 % (ref 0–5)
Eosinophils Absolute: 0.1 10*3/uL (ref 0.0–0.7)
HCT: 36.3 % (ref 36.0–46.0)
Hemoglobin: 12.4 g/dL (ref 12.0–15.0)
LYMPHS ABS: 3.2 10*3/uL (ref 0.7–4.0)
Lymphocytes Relative: 43 % (ref 12–46)
MCH: 25.9 pg — AB (ref 26.0–34.0)
MCHC: 34.2 g/dL (ref 30.0–36.0)
MCV: 75.8 fL — AB (ref 78.0–100.0)
Monocytes Absolute: 0.6 10*3/uL (ref 0.1–1.0)
Monocytes Relative: 8 % (ref 3–12)
Neutro Abs: 3.5 10*3/uL (ref 1.7–7.7)
Neutrophils Relative %: 47 % (ref 43–77)
Platelets: 308 10*3/uL (ref 150–400)
RBC: 4.79 MIL/uL (ref 3.87–5.11)
RDW: 15.8 % — ABNORMAL HIGH (ref 11.5–15.5)
WBC: 7.4 10*3/uL (ref 4.0–10.5)

## 2013-11-20 LAB — LIPID PANEL
Cholesterol: 176 mg/dL (ref 0–200)
HDL: 55 mg/dL (ref >39)
LDL Cholesterol: 100 mg/dL — ABNORMAL HIGH (ref 0–99)
Total CHOL/HDL Ratio: 3.2 Ratio
Triglycerides: 103 mg/dL (ref <150)
VLDL: 21 mg/dL (ref 0–40)

## 2013-11-20 NOTE — Progress Notes (Signed)
Subjective:    Patient ID: [name], female    DOB: 1941/05/02, 73 y.o.   MRN: [id number]  HPI  Patient has a history of hypertension and hyperlipidemia. At our last office visit, the patient was complaining of dizziness on doxazosin 4 mg by mouth daily. Therefore I decreased the dose to 2 mg by mouth daily. Her blood pressure has remained very low even at this days. Her blood pressures typically 100-110/50-60. She continues to orthostatic dizziness. She denies any chest pain or shortness of breath. She denies any myalgias or right upper quadrant pain. She does have some pain in her left shoulder consistent with bursitis and pain over her left elbow consistent with lateral epicondylitis. She's been taking Advil for this with some improvement. However she continues to report daily abdominal bloating and epigastric area. She has tried and failed align.  She denies GERD/indigestion, N/V.  She is overdue for a screening colonoscopy. Past Medical History  Diagnosis Date  . Chest pain   . Dyspepsia   . Hypertension   . Left atrial dilatation     Mild  . Mild aortic sclerosis     without stenosis  . Mitral valve disorder     Mildly thickened mitral valve, but with normal leaflet but with normal leaflet   . Mitral annular calcification     Mild  . Hypercholesterolemia   . Brain tumor   . 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 Gallbladder sludge   . Colon polyps   . Arthritis     rheumatoid arthritis   Current Outpatient Prescriptions on File Prior to Visit  Medication Sig Dispense Refill  . amLODipine (NORVASC) 10 MG tablet TAKE 1 TABLET BY MOUTH EVERY DAY  30 tablet  11  . Ascorbic Acid (VITAMIN C) 1000 MG tablet Take 1,000 mg by mouth daily.        Marland Kitchen aspirin 81 MG chewable tablet Chew 81 mg by mouth daily.        Marland Kitchen atenolol (TENORMIN) 50 MG tablet TAKE 1 TABLET (50 MG TOTAL) BY MOUTH DAILY.  30 tablet  3  . atorvastatin (LIPITOR) 80 MG  tablet TAKE 1/2 TABLET BY MOUTH DAILY  30 tablet  4  . bifidobacterium infantis (ALIGN) capsule Take 1 capsule by mouth daily.      . Cholecalciferol (VITAMIN D) 2000 UNITS tablet Take 2,000 Units by mouth daily.      . Coenzyme Q10 (CO Q-10) 100 MG CAPS Take by mouth.      . doxazosin (CARDURA) 4 MG tablet Take 1 tablet (4 mg total) by mouth daily.  30 tablet  5  . fenofibrate 54 MG tablet TAKE 1 TABLET EVERY DAY  30 tablet  5  . losartan-hydrochlorothiazide (HYZAAR) 100-25 MG per tablet TAKE 1 TABLET BY MOUTH EVERY DAY  30 tablet  11  . omeprazole (PRILOSEC) 40 MG capsule TAKE ONE CAPSULE BY MOUTH EVERY MORNING  30 capsule  10  . Polyethyl Glycol-Propyl Glycol (SYSTANE) 0.4-0.3 % SOLN Apply to eye.      . pyridOXINE (VITAMIN B-6) 100 MG tablet Take 100 mg by mouth daily as needed.        No current facility-administered medications on file prior to visit.   Allergies  Allergen Reactions  . Vytorin [Ezetimibe-Simvastatin] Other (See Comments)    myalgia   History   Social History  . Marital Status: Widowed  Spouse Name: N/A    Number of Children: N/A  . Years of Education: N/A   Occupational History  . Not on file.   Social History Main Topics  . Smoking status: Former Smoker    Quit date: 09/07/1973  . Smokeless tobacco: Never Used  . Alcohol Use: No  . Drug Use: No  . Sexual Activity: Not on file   Other Topics Concern  . Not on file   Social History Narrative  . No narrative on file     Review of Systems  All other systems reviewed and are negative.      Objective:   Physical Exam  Vitals reviewed. Cardiovascular: Normal rate, regular rhythm and normal heart sounds.   No murmur heard. Pulmonary/Chest: Effort normal and breath sounds normal. No respiratory distress. She has no wheezes. She has no rales.  Abdominal: Soft. Bowel sounds are normal. She exhibits no distension. There is no tenderness. There is no rebound and no guarding.  Musculoskeletal: She  exhibits no edema.          Assessment & Plan:  1. Screen for colon cancer Consult GI to arrange a screening colonoscopy. I will start the patient on Amitiza at 24 mcg by mouth twice a day for bloating and constipation. Recheck in 1 week if no better or sooner if worse. - Ambulatory referral to Gastroenterology  2. Essential hypertension Blood pressure is actually low given her age. Therefore I will discontinue the doxazosin. I asked the patient to monitor her blood pressure closely at home and notify me if it rises greater than 140/90 - CBC with Differential - COMPLETE METABOLIC PANEL WITH GFR - Lipid panel  3. HLD (hyperlipidemia) Check fasting lipid panel. Goal LDL is less than 130.Marland Kitchen

## 2013-11-22 ENCOUNTER — Encounter: Payer: Self-pay | Admitting: Family Medicine

## 2013-11-24 ENCOUNTER — Telehealth: Payer: Self-pay | Admitting: *Deleted

## 2013-11-24 NOTE — Telephone Encounter (Signed)
Received fax from [facility] care mgmt with authorizaton number [id number] from Kyla Balzarine MD, authorization was faxed to [facility].

## 2013-12-30 ENCOUNTER — Ambulatory Visit (INDEPENDENT_AMBULATORY_CARE_PROVIDER_SITE_OTHER): Payer: Commercial Managed Care - HMO | Admitting: Family Medicine

## 2013-12-30 ENCOUNTER — Encounter: Payer: Self-pay | Admitting: Family Medicine

## 2013-12-30 VITALS — BP 146/80 | HR 80 | Temp 98.2°F | Resp 18 | Ht 64.0 in | Wt 263.0 lb

## 2013-12-30 DIAGNOSIS — K1120 Sialoadenitis, unspecified: Secondary | ICD-10-CM

## 2013-12-30 MED ORDER — AMOXICILLIN-POT CLAVULANATE 875-125 MG PO TABS: 1.0000 | ORAL_TABLET | Freq: Two times a day (BID) | ORAL | Status: DC

## 2013-12-30 MED ORDER — FIRST-DUKES MOUTHWASH MT SUSP
5.0000 mL | Freq: Four times a day (QID) | OROMUCOSAL | Status: DC | PRN
Start: 2013-12-30 — End: 2014-05-28

## 2013-12-30 NOTE — Progress Notes (Signed)
Subjective:    Patient ID: [name], female    DOB: 1941-04-11, 73 y.o.   MRN: [id number]  HPI Patient has a five day history of a painful, swollen right submandibular gland.  It is warm and tender  to the touch.  On the lower palate under her tongue there are 4 ulcers on erythematous bases that are 2 mm in size and clumped together on the right side. Past Medical History  Diagnosis Date  . Chest pain   . Dyspepsia   . Hypertension   . Left atrial dilatation     Mild  . Mild aortic sclerosis     without stenosis  . Mitral valve disorder     Mildly thickened mitral valve, but with normal leaflet but with normal leaflet   . Mitral annular calcification     Mild  . Hypercholesterolemia   . Brain tumor   . 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 Gallbladder sludge   . Colon polyps   . Arthritis     rheumatoid arthritis   Past Surgical History  Procedure Laterality Date  . Tubal ligation  1981  . Rt knee surg  1991  . Lt eye implant  1998   Current Outpatient Prescriptions on File Prior to Visit  Medication Sig Dispense Refill  . amLODipine (NORVASC) 10 MG tablet TAKE 1 TABLET BY MOUTH EVERY DAY  30 tablet  11  . Ascorbic Acid (VITAMIN C) 1000 MG tablet Take 1,000 mg by mouth daily.        Marland Kitchen aspirin 81 MG chewable tablet Chew 81 mg by mouth daily.        Marland Kitchen atenolol (TENORMIN) 50 MG tablet TAKE 1 TABLET (50 MG TOTAL) BY MOUTH DAILY.  30 tablet  3  . atorvastatin (LIPITOR) 80 MG tablet TAKE 1/2 TABLET BY MOUTH DAILY  30 tablet  4  . bifidobacterium infantis (ALIGN) capsule Take 1 capsule by mouth daily.      . Cholecalciferol (VITAMIN D) 2000 UNITS tablet Take 2,000 Units by mouth daily.      . Coenzyme Q10 (CO Q-10) 100 MG CAPS Take by mouth.      . doxazosin (CARDURA) 4 MG tablet Take 1 tablet (4 mg total) by mouth daily.  30 tablet  5  . fenofibrate 54 MG tablet TAKE 1 TABLET EVERY DAY  30 tablet  5  .  losartan-hydrochlorothiazide (HYZAAR) 100-25 MG per tablet TAKE 1 TABLET BY MOUTH EVERY DAY  30 tablet  11  . omeprazole (PRILOSEC) 40 MG capsule TAKE ONE CAPSULE BY MOUTH EVERY MORNING  30 capsule  10  . Polyethyl Glycol-Propyl Glycol (SYSTANE) 0.4-0.3 % SOLN Apply to eye.      . pyridOXINE (VITAMIN B-6) 100 MG tablet Take 100 mg by mouth daily as needed.        No current facility-administered medications on file prior to visit.   Allergies  Allergen Reactions  . Vytorin [Ezetimibe-Simvastatin] Other (See Comments)    myalgia   History   Social History  . Marital Status: Widowed    Spouse Name: N/A    Number of Children: N/A  . Years of Education: N/A   Occupational History  . Not on file.   Social History Main Topics  . Smoking status: Former Smoker    Quit date: 09/07/1973  . Smokeless tobacco: Never Used  . Alcohol Use: No  .  Drug Use: No  . Sexual Activity: Not on file   Other Topics Concern  . Not on file   Social History Narrative  . No narrative on file       Review of Systems  All other systems reviewed and are negative.      Objective:   Physical Exam  Vitals reviewed. HENT:  Right Ear: External ear normal.  Left Ear: External ear normal.  Nose: Nose normal.  Mouth/Throat: No oropharyngeal exudate.  Eyes: Conjunctivae are normal.  Neck: Neck supple.  Cardiovascular: Normal rate and regular rhythm.   Pulmonary/Chest: Effort normal and breath sounds normal.  Lymphadenopathy:    She has no cervical adenopathy.   Very tender, swollen, warm right submandibular gland.  4 ulcers under the tongue on the right side of her lower palate.       Assessment & Plan:  1. Sialadenitis This is likely a viral, herpetiform infection.  However given the severity of the pain and swelling in her right submandibular gland, treat the patient with Augmentin 875 mg by mouth twice a day for 10 days.  I also gave the patient Dukes Magic mouthwash 1 teaspoon gargle  and swallow every 4-6 hours as needed for pain. She can also take ibuprofen 800 mg every 8 hours. Recheck in 48 hours if no better or sooner if worse. - amoxicillin-clavulanate (AUGMENTIN) 875-125 MG per tablet; Take 1 tablet by mouth 2 (two) times daily.  Dispense: 20 tablet; Refill: 0 - Diphenhyd-Hydrocort-Nystatin (FIRST-DUKES MOUTHWASH) SUSP; Use as directed 5 mLs in the mouth or throat 4 (four) times daily as needed (sore under tongue).  Dispense: 120 mL; Refill: 0

## 2014-01-18 ENCOUNTER — Other Ambulatory Visit: Payer: Self-pay | Admitting: Family Medicine

## 2014-02-09 ENCOUNTER — Other Ambulatory Visit: Payer: Self-pay | Admitting: Family Medicine

## 2014-02-09 DIAGNOSIS — Z1231 Encounter for screening mammogram for malignant neoplasm of breast: Secondary | ICD-10-CM

## 2014-02-11 ENCOUNTER — Other Ambulatory Visit: Payer: Self-pay | Admitting: Family Medicine

## 2014-02-11 ENCOUNTER — Ambulatory Visit (INDEPENDENT_AMBULATORY_CARE_PROVIDER_SITE_OTHER): Payer: Commercial Managed Care - HMO | Admitting: *Deleted

## 2014-02-11 DIAGNOSIS — Z23 Encounter for immunization: Secondary | ICD-10-CM

## 2014-02-11 NOTE — Telephone Encounter (Signed)
Medication refilled per protocol. 

## 2014-02-24 ENCOUNTER — Ambulatory Visit (INDEPENDENT_AMBULATORY_CARE_PROVIDER_SITE_OTHER): Payer: Commercial Managed Care - HMO | Admitting: Family Medicine

## 2014-02-24 ENCOUNTER — Encounter: Payer: Self-pay | Admitting: Family Medicine

## 2014-02-24 VITALS — BP 140/80 | HR 72 | Temp 98.6°F | Resp 18 | Ht 64.0 in | Wt 262.0 lb

## 2014-02-24 DIAGNOSIS — I10 Essential (primary) hypertension: Secondary | ICD-10-CM

## 2014-02-24 DIAGNOSIS — E785 Hyperlipidemia, unspecified: Secondary | ICD-10-CM

## 2014-02-24 LAB — LIPID PANEL
Cholesterol: 176 mg/dL (ref 0–200)
HDL: 56 mg/dL (ref >39)
LDL Cholesterol: 105 mg/dL — ABNORMAL HIGH (ref 0–99)
Total CHOL/HDL Ratio: 3.1 Ratio
Triglycerides: 75 mg/dL (ref <150)
VLDL: 15 mg/dL (ref 0–40)

## 2014-02-24 LAB — COMPLETE METABOLIC PANEL WITH GFR
ALBUMIN: 4.5 g/dL (ref 3.5–5.2)
ALT: 19 U/L (ref 0–35)
AST: 20 U/L (ref 0–37)
Alkaline Phosphatase: 44 U/L (ref 39–117)
BUN: 21 mg/dL (ref 6–23)
CALCIUM: 10.1 mg/dL (ref 8.4–10.5)
CHLORIDE: 105 meq/L (ref 96–112)
CO2: 28 mEq/L (ref 19–32)
Creat: 0.95 mg/dL (ref 0.50–1.10)
GFR, Est African American: 69 mL/min
GFR, Est Non African American: 60 mL/min
GLUCOSE: 99 mg/dL (ref 70–99)
Potassium: 3.8 mEq/L (ref 3.5–5.3)
SODIUM: 140 meq/L (ref 135–145)
TOTAL PROTEIN: 7.5 g/dL (ref 6.0–8.3)
Total Bilirubin: 0.5 mg/dL (ref 0.2–1.2)

## 2014-02-24 NOTE — Progress Notes (Signed)
Subjective:    Patient ID: [name], female    DOB: 05/25/1940, 73 y.o.   MRN: [id number]  HPI Patient is here today to followup her hypertension as well as hyperlipidemia. At her last office visit I discontinued oxytocin due to orthostatic dizziness. Today her blood pressure is acceptable at 140/80. She states her blood pressures in the 120s to 130s over 80-90 at home. She denies any chest pain shortness of breath or dyspnea on exertion. She does complain of swelling in her ankles which is likely attributed to the amlodipine however she can accept this amount of swelling at the present time.  She is currently on fenofibrate and Lipitor for hyperlipidemia. She denies any myalgias or right upper quadrant pain at the present time. She is due for a fasting lipid panel. She continues to complain of bloating in her lower abdomen. She did have a colonoscopy in August which was significant for several sessile polyps. However there is no significant abnormality seen. Her gastroenterologist recommended that she increase her fiber to help with her abdominal bloating. Past Medical History  Diagnosis Date  . Chest pain   . Dyspepsia   . Hypertension   . Left atrial dilatation     Mild  . Mild aortic sclerosis     without stenosis  . Mitral valve disorder     Mildly thickened mitral valve, but with normal leaflet but with normal leaflet   . Mitral annular calcification     Mild  . Hypercholesterolemia   . Brain tumor   . 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 Gallbladder sludge   . Colon polyps   . Arthritis     rheumatoid arthritis   Past Surgical History  Procedure Laterality Date  . Tubal ligation  1981  . Rt knee surg  1991  . Lt eye implant  1998   Current Outpatient Prescriptions on File Prior to Visit  Medication Sig Dispense Refill  . amLODipine (NORVASC) 10 MG tablet TAKE 1 TABLET BY MOUTH EVERY DAY  30 tablet  11  . Ascorbic Acid  (VITAMIN C) 1000 MG tablet Take 1,000 mg by mouth daily.        Marland Kitchen aspirin 81 MG chewable tablet Chew 81 mg by mouth daily.        Marland Kitchen atenolol (TENORMIN) 50 MG tablet TAKE 1 TABLET (50 MG TOTAL) BY MOUTH DAILY.  30 tablet  5  . atorvastatin (LIPITOR) 80 MG tablet TAKE 1/2 TABLET BY MOUTH DAILY  30 tablet  4  . bifidobacterium infantis (ALIGN) capsule Take 1 capsule by mouth daily.      . Cholecalciferol (VITAMIN D) 2000 UNITS tablet Take 2,000 Units by mouth daily.      . Coenzyme Q10 (CO Q-10) 100 MG CAPS Take by mouth.      . Diphenhyd-Hydrocort-Nystatin (FIRST-DUKES MOUTHWASH) SUSP Use as directed 5 mLs in the mouth or throat 4 (four) times daily as needed (sore under tongue).  120 mL  0  . fenofibrate 54 MG tablet TAKE 1 TABLET EVERY DAY  30 tablet  5  . losartan-hydrochlorothiazide (HYZAAR) 100-25 MG per tablet TAKE 1 TABLET BY MOUTH EVERY DAY  30 tablet  11  . omeprazole (PRILOSEC) 40 MG capsule TAKE ONE CAPSULE BY MOUTH EVERY MORNING  30 capsule  10  . Polyethyl Glycol-Propyl Glycol (SYSTANE) 0.4-0.3 % SOLN Apply to eye.      Marland Kitchen  pyridOXINE (VITAMIN B-6) 100 MG tablet Take 100 mg by mouth daily as needed.       Marland Kitchen amoxicillin-clavulanate (AUGMENTIN) 875-125 MG per tablet Take 1 tablet by mouth 2 (two) times daily.  20 tablet  0   No current facility-administered medications on file prior to visit.   Allergies  Allergen Reactions  . Vytorin [Ezetimibe-Simvastatin] Other (See Comments)    myalgia   History   Social History  . Marital Status: Widowed    Spouse Name: N/A    Number of Children: N/A  . Years of Education: N/A   Occupational History  . Not on file.   Social History Main Topics  . Smoking status: Former Smoker    Quit date: 09/07/1973  . Smokeless tobacco: Never Used  . Alcohol Use: No  . Drug Use: No  . Sexual Activity: Not on file   Other Topics Concern  . Not on file   Social History Narrative  . No narrative on file      Review of Systems  All other  systems reviewed and are negative.      Objective:   Physical Exam  Vitals reviewed. Constitutional: She appears well-developed and well-nourished.  Cardiovascular: Normal rate, regular rhythm and normal heart sounds.   No murmur heard. Pulmonary/Chest: Effort normal and breath sounds normal. No respiratory distress. She has no wheezes. She has no rales.  Abdominal: Soft. Bowel sounds are normal. She exhibits no distension. There is no tenderness. There is no rebound.  Musculoskeletal: She exhibits edema. She exhibits no tenderness.          Assessment & Plan:  HLD (hyperlipidemia) - Plan: COMPLETE METABOLIC PANEL WITH GFR, Lipid panel  Essential hypertension - Plan: COMPLETE METABOLIC PANEL WITH GFR  Patient's blood pressure is acceptable. I do not want to discontinue amlodipine for the swelling because her other choices for antihypertensive meds I believe will have worse side effects.  Patient's flu shot is up-to-date. I will check a fasting lipid panel. Goal LDL cholesterol is less than 130. I would like to see her triglycerides less than 200.

## 2014-02-26 ENCOUNTER — Encounter: Payer: Self-pay | Admitting: *Deleted

## 2014-03-10 ENCOUNTER — Other Ambulatory Visit: Payer: Self-pay | Admitting: Family Medicine

## 2014-03-16 ENCOUNTER — Ambulatory Visit (HOSPITAL_COMMUNITY)
Admission: RE | Admit: 2014-03-16 | Discharge: 2014-03-16 | Disposition: A | Discharge status: Home / Self Care | Payer: Medicare PPO | Source: Ambulatory Visit | Attending: Family Medicine | Admitting: Family Medicine

## 2014-03-16 DIAGNOSIS — Z1231 Encounter for screening mammogram for malignant neoplasm of breast: Secondary | ICD-10-CM | POA: Diagnosis not present

## 2014-05-28 ENCOUNTER — Encounter: Payer: Self-pay | Admitting: Family Medicine

## 2014-05-28 ENCOUNTER — Ambulatory Visit (INDEPENDENT_AMBULATORY_CARE_PROVIDER_SITE_OTHER): Payer: Commercial Managed Care - HMO | Admitting: Family Medicine

## 2014-05-28 VITALS — BP 150/82 | HR 76 | Temp 98.1°F | Resp 18 | Ht 64.0 in | Wt 265.0 lb

## 2014-05-28 DIAGNOSIS — E785 Hyperlipidemia, unspecified: Secondary | ICD-10-CM

## 2014-05-28 DIAGNOSIS — Z23 Encounter for immunization: Secondary | ICD-10-CM

## 2014-05-28 DIAGNOSIS — I10 Essential (primary) hypertension: Secondary | ICD-10-CM

## 2014-05-28 LAB — CBC WITH DIFFERENTIAL/PLATELET
BASOS ABS: 0 10*3/uL (ref 0.0–0.1)
Basophils Relative: 0 % (ref 0–1)
Eosinophils Absolute: 0.1 10*3/uL (ref 0.0–0.7)
Eosinophils Relative: 2 % (ref 0–5)
HEMATOCRIT: 37.8 % (ref 36.0–46.0)
Hemoglobin: 12.8 g/dL (ref 12.0–15.0)
LYMPHS ABS: 3.4 10*3/uL (ref 0.7–4.0)
Lymphocytes Relative: 46 % (ref 12–46)
MCH: 26.3 pg (ref 26.0–34.0)
MCHC: 33.9 g/dL (ref 30.0–36.0)
MCV: 77.6 fL — ABNORMAL LOW (ref 78.0–100.0)
MONOS PCT: 7 % (ref 3–12)
MPV: 9.2 fL (ref 8.6–12.4)
Monocytes Absolute: 0.5 10*3/uL (ref 0.1–1.0)
NEUTROS PCT: 45 % (ref 43–77)
Neutro Abs: 3.3 10*3/uL (ref 1.7–7.7)
Platelets: 319 10*3/uL (ref 150–400)
RBC: 4.87 MIL/uL (ref 3.87–5.11)
RDW: 15.4 % (ref 11.5–15.5)
WBC: 7.3 10*3/uL (ref 4.0–10.5)

## 2014-05-28 LAB — COMPLETE METABOLIC PANEL WITH GFR
ALK PHOS: 49 U/L (ref 39–117)
ALT: 24 U/L (ref 0–35)
AST: 24 U/L (ref 0–37)
Albumin: 4.1 g/dL (ref 3.5–5.2)
BUN: 14 mg/dL (ref 6–23)
CO2: 24 meq/L (ref 19–32)
Calcium: 9.6 mg/dL (ref 8.4–10.5)
Chloride: 105 mEq/L (ref 96–112)
Creat: 0.7 mg/dL (ref 0.50–1.10)
GFR, Est Non African American: 87 mL/min
GLUCOSE: 98 mg/dL (ref 70–99)
Potassium: 4.1 mEq/L (ref 3.5–5.3)
SODIUM: 139 meq/L (ref 135–145)
Total Bilirubin: 0.4 mg/dL (ref 0.2–1.2)
Total Protein: 7.6 g/dL (ref 6.0–8.3)

## 2014-05-28 LAB — LIPID PANEL
CHOL/HDL RATIO: 3.5 ratio
Cholesterol: 187 mg/dL (ref 0–200)
HDL: 54 mg/dL (ref >39)
LDL Cholesterol: 118 mg/dL — ABNORMAL HIGH (ref 0–99)
Triglycerides: 74 mg/dL (ref <150)
VLDL: 15 mg/dL (ref 0–40)

## 2014-05-28 MED ORDER — GLUCOSE BLOOD VI STRP: ORAL_STRIP | Status: DC

## 2014-05-28 MED ORDER — ACCU-CHEK AVIVA DEVI: Status: AC

## 2014-05-28 MED ORDER — ACCU-CHEK SOFTCLIX LANCET DEV MISC
Status: DC
Start: 2014-05-28 — End: 2015-12-22

## 2014-05-28 NOTE — Addendum Note (Signed)
Addended by: Shary Decamp B on: 05/28/2014 02:51 PM   Modules accepted: Orders

## 2014-05-28 NOTE — Progress Notes (Signed)
Subjective:    Patient ID: [name], female    DOB: 1941/01/16, 74 y.o.   MRN: [id number]  HPI Patient has a history of hypertension and hyperlipidemia. Her blood pressure is slightly high today at 150/82 but she is slightly anxious. The patient checks her blood pressure on a daily basis at home and it is consistently 120-130/70-80. She denies any chest pain shortness of breath or dyspnea on exertion. She denies any myalgias or right upper quadrant pain. She is due today to check her CMP and fasting lipid panel. Past Medical History  Diagnosis Date  . Chest pain   . Dyspepsia   . Hypertension   . Left atrial dilatation     Mild  . Mild aortic sclerosis     without stenosis  . Mitral valve disorder     Mildly thickened mitral valve, but with normal leaflet but with normal leaflet   . Mitral annular calcification     Mild  . Hypercholesterolemia   . Brain tumor   . 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 Gallbladder sludge   . Colon polyps   . Arthritis     rheumatoid arthritis   Past Surgical History  Procedure Laterality Date  . Tubal ligation  1981  . Rt knee surg  1991  . Lt eye implant  1998   Current Outpatient Prescriptions on File Prior to Visit  Medication Sig Dispense Refill  . amLODipine (NORVASC) 10 MG tablet TAKE 1 TABLET BY MOUTH EVERY DAY 30 tablet 11  . Ascorbic Acid (VITAMIN C) 1000 MG tablet Take 1,000 mg by mouth daily.      Marland Kitchen aspirin 81 MG chewable tablet Chew 81 mg by mouth daily.      Marland Kitchen atenolol (TENORMIN) 50 MG tablet TAKE 1 TABLET (50 MG TOTAL) BY MOUTH DAILY. 30 tablet 5  . atorvastatin (LIPITOR) 80 MG tablet TAKE 1/2 TABLET BY MOUTH DAILY 30 tablet 4  . Cholecalciferol (VITAMIN D) 2000 UNITS tablet Take 1,000 Units by mouth daily.     . Coenzyme Q10 (CO Q-10) 100 MG CAPS Take by mouth.    . fenofibrate 54 MG tablet TAKE 1 TABLET EVERY DAY 30 tablet 5  . losartan-hydrochlorothiazide (HYZAAR) 100-25  MG per tablet TAKE 1 TABLET BY MOUTH EVERY DAY 30 tablet 11  . Polyethyl Glycol-Propyl Glycol (SYSTANE) 0.4-0.3 % SOLN Apply to eye.    . pyridOXINE (VITAMIN B-6) 100 MG tablet Take 100 mg by mouth daily as needed.     Marland Kitchen omeprazole (PRILOSEC) 40 MG capsule TAKE ONE CAPSULE BY MOUTH EVERY MORNING (Patient not taking: Reported on 05/28/2014) 30 capsule 10   No current facility-administered medications on file prior to visit.   Allergies  Allergen Reactions  . Vytorin [Ezetimibe-Simvastatin] Other (See Comments)    myalgia   History   Social History  . Marital Status: Widowed    Spouse Name: N/A    Number of Children: N/A  . Years of Education: N/A   Occupational History  . Not on file.   Social History Main Topics  . Smoking status: Former Smoker    Quit date: 09/07/1973  . Smokeless tobacco: Never Used  . Alcohol Use: No  . Drug Use: No  . Sexual Activity: Not on file   Other Topics Concern  . Not on file   Social History Narrative      Review of Systems  All other systems reviewed and are negative.      Objective:   Physical Exam  Constitutional: She appears well-developed and well-nourished.  Neck: Neck supple. No JVD present. No thyromegaly present.  Cardiovascular: Normal rate, regular rhythm, normal heart sounds and intact distal pulses.  Exam reveals no gallop and no friction rub.   No murmur heard. Pulmonary/Chest: Effort normal and breath sounds normal. No respiratory distress. She has no wheezes. She has no rales. She exhibits no tenderness.  Abdominal: Soft. Bowel sounds are normal. She exhibits no distension and no mass. There is no tenderness. There is no rebound and no guarding.  Musculoskeletal: She exhibits no edema.  Lymphadenopathy:    She has no cervical adenopathy.  Vitals reviewed.         Assessment & Plan:  Benign essential HTN - Plan: COMPLETE METABOLIC PANEL WITH GFR, Lipid panel, CBC with Differential  Need for prophylactic  vaccination against Streptococcus pneumoniae (pneumococcus) - Plan: Pneumococcal conjugate vaccine 13-valent IM  HLD (hyperlipidemia)  Patient's blood pressure is high here today but I believe this is white coat syndrome. I will check a fasting lipid panel as well as a CMP. I encouraged the patient to continue to monitor her blood pressure daily at home and notify me if her blood pressure rises greater than 140/90. Patient is due for an Prevnar 13 today and she received that in clinic.

## 2014-05-29 ENCOUNTER — Encounter: Payer: Self-pay | Admitting: *Deleted

## 2014-07-21 ENCOUNTER — Other Ambulatory Visit: Payer: Self-pay | Admitting: Family Medicine

## 2014-09-01 ENCOUNTER — Other Ambulatory Visit: Payer: Self-pay | Admitting: Family Medicine

## 2014-09-03 ENCOUNTER — Telehealth: Payer: Self-pay | Admitting: *Deleted

## 2014-09-03 ENCOUNTER — Ambulatory Visit (INDEPENDENT_AMBULATORY_CARE_PROVIDER_SITE_OTHER): Payer: Commercial Managed Care - HMO | Admitting: Family Medicine

## 2014-09-03 ENCOUNTER — Encounter: Payer: Self-pay | Admitting: Family Medicine

## 2014-09-03 VITALS — BP 138/80 | HR 72 | Temp 98.0°F | Resp 18 | Ht 64.0 in | Wt 264.0 lb

## 2014-09-03 DIAGNOSIS — R7309 Other abnormal glucose: Secondary | ICD-10-CM | POA: Diagnosis not present

## 2014-09-03 DIAGNOSIS — R319 Hematuria, unspecified: Secondary | ICD-10-CM

## 2014-09-03 DIAGNOSIS — E785 Hyperlipidemia, unspecified: Secondary | ICD-10-CM | POA: Diagnosis not present

## 2014-09-03 DIAGNOSIS — I10 Essential (primary) hypertension: Secondary | ICD-10-CM

## 2014-09-03 DIAGNOSIS — R739 Hyperglycemia, unspecified: Secondary | ICD-10-CM | POA: Diagnosis not present

## 2014-09-03 NOTE — Telephone Encounter (Signed)
Submitted [employer] referral thru acuity connect for authorization to [place] [facility] with authorization number [id number]  Requesting provider:Warren T. Pickard,MD  Treating provider:[facility]  Number of visits:1  Start Date:09/03/14  End Date:03/02/15  Dx:R31.9-Hematuria  Type of service:USND  Procedures: 46962-XB exam abdo back wall comp  Copy has been faxed to [facility] at [facility] [address]

## 2014-09-03 NOTE — Progress Notes (Signed)
Subjective:    Patient ID: [name], female    DOB: [birth year], 74 y.o.   MRN: [id number]  HPI Patient is a very sweet 74 year old African-American female who comes in today for her regular checkup. Her blood pressures well controlled at 138/80. She denies any chest pain shortness of breath or dyspnea on exertion. She has been having some right-sided lower back pain. She's also had 2 episodes of gross hematuria. She denies any dysuria. She does report chronic abdominal bloating. She reports occasional constipation. She denies any myalgias or right upper quadrant pain on her cholesterol medication. Past Medical History  Diagnosis Date  . Chest pain   . Dyspepsia   . Hypertension   . Left atrial dilatation     Mild  . Mild aortic sclerosis     without stenosis  . Mitral valve disorder     Mildly thickened mitral valve, but with normal leaflet but with normal leaflet   . Mitral annular calcification     Mild  . Hypercholesterolemia   . Brain tumor   . 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 Gallbladder sludge   . Colon polyps   . Arthritis     rheumatoid arthritis   Past Surgical History  Procedure Laterality Date  . Tubal ligation  1981  . Rt knee surg  1991  . Lt eye implant  1998   Current Outpatient Prescriptions on File Prior to Visit  Medication Sig Dispense Refill  . amLODipine (NORVASC) 10 MG tablet TAKE 1 TABLET BY MOUTH EVERY DAY 30 tablet 11  . Ascorbic Acid (VITAMIN C) 1000 MG tablet Take 1,000 mg by mouth daily.      Marland Kitchen aspirin 81 MG chewable tablet Chew 81 mg by mouth daily.      Marland Kitchen atenolol (TENORMIN) 50 MG tablet TAKE 1 TABLET (50 MG TOTAL) BY MOUTH DAILY. 30 tablet 5  . atorvastatin (LIPITOR) 80 MG tablet TAKE 1/2 TABLET BY MOUTH DAILY 30 tablet 4  . Blood Glucose Monitoring Suppl (ACCU-CHEK AVIVA) device E11.9 1 each 0  . Coenzyme Q10 (CO Q-10) 100 MG CAPS Take by mouth.    . fenofibrate 54 MG tablet TAKE 1  TABLET EVERY DAY 30 tablet 5  . glucose blood (ACCU-CHEK AVIVA) test strip Check BS qd - DX - E11.9 50 each 12  . Lancet Devices (ACCU-CHEK SOFTCLIX) lancets Check BS qd - DX- E11.9 1 each 5  . losartan-hydrochlorothiazide (HYZAAR) 100-25 MG per tablet TAKE 1 TABLET BY MOUTH EVERY DAY 30 tablet 11  . Polyethyl Glycol-Propyl Glycol (SYSTANE) 0.4-0.3 % SOLN Apply to eye.    . pyridOXINE (VITAMIN B-6) 100 MG tablet Take 100 mg by mouth daily as needed.     . Cholecalciferol (VITAMIN D) 2000 UNITS tablet Take 1,000 Units by mouth daily.      No current facility-administered medications on file prior to visit.   Allergies  Allergen Reactions  . Vytorin [Ezetimibe-Simvastatin] Other (See Comments)    myalgia   History   Social History  . Marital Status: Widowed    Spouse Name: N/A  . Number of Children: N/A  . Years of Education: N/A   Occupational History  . Not on file.   Social History Main Topics  . Smoking status: Former Smoker    Quit date: 09/07/1973  . Smokeless tobacco: Never Used  . Alcohol Use: No  . Drug Use: No  .  Sexual Activity: Not on file   Other Topics Concern  . Not on file   Social History Narrative      Review of Systems  All other systems reviewed and are negative.      Objective:   Physical Exam  Constitutional: She appears well-developed and well-nourished. No distress.  Neck: No thyromegaly present.  Cardiovascular: Normal rate, regular rhythm, normal heart sounds and intact distal pulses.   No murmur heard. Pulmonary/Chest: Effort normal and breath sounds normal. No respiratory distress. She has no wheezes. She has no rales. She exhibits no tenderness.  Abdominal: Soft. Bowel sounds are normal. She exhibits no distension. There is no tenderness. There is no rebound and no guarding.  Musculoskeletal: She exhibits no edema.  Skin: She is not diaphoretic.  Vitals reviewed.         Assessment & Plan:  Hematuria - Plan: Urinalysis,  Routine w reflex microscopic, US Renal  Hyperglycemia - Plan: Hemoglobin A1c  HLD (hyperlipidemia) - Plan: COMPLETE METABOLIC PANEL WITH GFR, Lipid panel  Benign essential HTN  I am very happy with the patient's blood pressure. She's been checking her blood sugar at home and finding it to be between 90 and 112. She denies any hypoglycemia. She denies any severe hypoglycemia. She is monitoring her diet and trying to eat a low carbohydrate diet. I will also check a fasting lipid panel as well as a hemoglobin A1c. Goal LDL cholesterol is less than 130. Due to the 2 episodes of hematuria with the patient has witnessed trace blood in the urine after going to the restroom, I will obtain a urinalysis today as well as a renal ultrasound. Consider a cystoscopy based on the results

## 2014-09-04 ENCOUNTER — Encounter: Payer: Self-pay | Admitting: Family Medicine

## 2014-09-04 LAB — COMPLETE METABOLIC PANEL WITH GFR
ALT: 23 U/L (ref 0–35)
AST: 25 U/L (ref 0–37)
Albumin: 4.2 g/dL (ref 3.5–5.2)
Alkaline Phosphatase: 47 U/L (ref 39–117)
BUN: 16 mg/dL (ref 6–23)
CO2: 25 meq/L (ref 19–32)
Calcium: 10 mg/dL (ref 8.4–10.5)
Chloride: 103 mEq/L (ref 96–112)
Creat: 0.72 mg/dL (ref 0.50–1.10)
GFR, Est African American: >89 mL/min
GFR, Est Non African American: 83 mL/min
GLUCOSE: 103 mg/dL — AB (ref 70–99)
POTASSIUM: 3.9 meq/L (ref 3.5–5.3)
SODIUM: 140 meq/L (ref 135–145)
TOTAL PROTEIN: 7.7 g/dL (ref 6.0–8.3)
Total Bilirubin: 0.4 mg/dL (ref 0.2–1.2)

## 2014-09-04 LAB — LIPID PANEL
Cholesterol: 178 mg/dL (ref 0–200)
HDL: 63 mg/dL (ref >=46)
LDL CALC: 101 mg/dL — AB (ref 0–99)
Total CHOL/HDL Ratio: 2.8 Ratio
Triglycerides: 70 mg/dL (ref <150)
VLDL: 14 mg/dL (ref 0–40)

## 2014-09-04 LAB — HEMOGLOBIN A1C
Hgb A1c MFr Bld: 6.1 % — ABNORMAL HIGH (ref <5.7)
Mean Plasma Glucose: 128 mg/dL — ABNORMAL HIGH (ref <117)

## 2014-09-07 ENCOUNTER — Other Ambulatory Visit: Payer: Medicare PPO

## 2014-09-08 ENCOUNTER — Ambulatory Visit (HOSPITAL_COMMUNITY)
Admission: RE | Admit: 2014-09-08 | Discharge: 2014-09-08 | Disposition: A | Discharge status: Home / Self Care | Payer: Commercial Managed Care - HMO | Source: Ambulatory Visit | Attending: Family Medicine | Admitting: Family Medicine

## 2014-09-08 DIAGNOSIS — R319 Hematuria, unspecified: Secondary | ICD-10-CM | POA: Diagnosis not present

## 2014-09-08 DIAGNOSIS — N281 Cyst of kidney, acquired: Secondary | ICD-10-CM | POA: Diagnosis not present

## 2014-09-16 ENCOUNTER — Encounter: Payer: Self-pay | Admitting: Family Medicine

## 2014-09-16 DIAGNOSIS — H5210 Myopia, unspecified eye: Secondary | ICD-10-CM | POA: Diagnosis not present

## 2014-09-16 DIAGNOSIS — H5203 Hypermetropia, bilateral: Secondary | ICD-10-CM | POA: Diagnosis not present

## 2014-09-30 ENCOUNTER — Other Ambulatory Visit: Payer: Self-pay | Admitting: Family Medicine

## 2014-12-04 ENCOUNTER — Ambulatory Visit (INDEPENDENT_AMBULATORY_CARE_PROVIDER_SITE_OTHER): Payer: Commercial Managed Care - HMO | Admitting: Family Medicine

## 2014-12-04 ENCOUNTER — Encounter: Payer: Self-pay | Admitting: Family Medicine

## 2014-12-04 VITALS — BP 152/72 | HR 74 | Temp 98.3°F | Resp 18 | Ht 64.0 in | Wt 263.0 lb

## 2014-12-04 DIAGNOSIS — E785 Hyperlipidemia, unspecified: Secondary | ICD-10-CM | POA: Diagnosis not present

## 2014-12-04 DIAGNOSIS — R7303 Prediabetes: Secondary | ICD-10-CM

## 2014-12-04 DIAGNOSIS — R7309 Other abnormal glucose: Secondary | ICD-10-CM

## 2014-12-04 DIAGNOSIS — Z0000 Encounter for general adult medical examination without abnormal findings: Secondary | ICD-10-CM | POA: Diagnosis not present

## 2014-12-04 LAB — CBC WITH DIFFERENTIAL/PLATELET
Basophils Absolute: 0 10*3/uL (ref 0.0–0.1)
Basophils Relative: 0 % (ref 0–1)
EOS PCT: 1 % (ref 0–5)
Eosinophils Absolute: 0.1 10*3/uL (ref 0.0–0.7)
HCT: 36.3 % (ref 36.0–46.0)
Hemoglobin: 12.2 g/dL (ref 12.0–15.0)
LYMPHS ABS: 3.1 10*3/uL (ref 0.7–4.0)
LYMPHS PCT: 34 % (ref 12–46)
MCH: 25.7 pg — AB (ref 26.0–34.0)
MCHC: 33.6 g/dL (ref 30.0–36.0)
MCV: 76.6 fL — ABNORMAL LOW (ref 78.0–100.0)
MPV: 9.2 fL (ref 8.6–12.4)
Monocytes Absolute: 0.7 10*3/uL (ref 0.1–1.0)
Monocytes Relative: 8 % (ref 3–12)
NEUTROS ABS: 5.2 10*3/uL (ref 1.7–7.7)
Neutrophils Relative %: 57 % (ref 43–77)
Platelets: 362 10*3/uL (ref 150–400)
RBC: 4.74 MIL/uL (ref 3.87–5.11)
RDW: 16.3 % — ABNORMAL HIGH (ref 11.5–15.5)
WBC: 9.2 10*3/uL (ref 4.0–10.5)

## 2014-12-04 LAB — LIPID PANEL
CHOLESTEROL: 181 mg/dL (ref 0–200)
HDL: 65 mg/dL (ref >=46)
LDL CALC: 102 mg/dL — AB (ref 0–99)
Total CHOL/HDL Ratio: 2.8 Ratio
Triglycerides: 71 mg/dL (ref <150)
VLDL: 14 mg/dL (ref 0–40)

## 2014-12-04 LAB — COMPLETE METABOLIC PANEL WITH GFR
ALBUMIN: 4.1 g/dL (ref 3.5–5.2)
ALT: 15 U/L (ref 0–35)
AST: 20 U/L (ref 0–37)
Alkaline Phosphatase: 52 U/L (ref 39–117)
BUN: 19 mg/dL (ref 6–23)
CO2: 25 mEq/L (ref 19–32)
CREATININE: 0.73 mg/dL (ref 0.50–1.10)
Calcium: 9.8 mg/dL (ref 8.4–10.5)
Chloride: 103 mEq/L (ref 96–112)
GFR, Est Non African American: 82 mL/min
Glucose, Bld: 108 mg/dL — ABNORMAL HIGH (ref 70–99)
Potassium: 3.7 mEq/L (ref 3.5–5.3)
SODIUM: 142 meq/L (ref 135–145)
Total Bilirubin: 0.4 mg/dL (ref 0.2–1.2)
Total Protein: 7.7 g/dL (ref 6.0–8.3)

## 2014-12-04 MED ORDER — MELOXICAM 15 MG PO TABS: 15.0000 mg | ORAL_TABLET | Freq: Every day | ORAL | Status: DC

## 2014-12-04 NOTE — Progress Notes (Signed)
Subjective:    Patient ID: [name], female    DOB: 24-May-1940, 74 y.o.   MRN: [id number]  HPI  Patient is here today for complete physical exam. She also complains of arthritis in her hands and her knees and in her lower back. She would like to try an NSAID sparingly to help manage her arthritic pain. Her blood pressure is elevated today 152/72 however she is going to a funeral and she believes anxiety is elevating her blood pressure. She states that she checks her blood pressure at home and it is typically less than 130/60. Last mammogram was in November 2015 and was normal. Due to her age she does not require a Pap smear. Last colonoscopy was 2015. It did show tubular adenomas and it is due for repeat colonoscopy in 2020. Immunizations are up-to-date including Prevnar as well as Pneumovax. She is due for the shingles vaccine Past Medical History  Diagnosis Date  . Chest pain   . Dyspepsia   . Hypertension   . Left atrial dilatation     Mild  . Mild aortic sclerosis     without stenosis  . Mitral valve disorder     Mildly thickened mitral valve, but with normal leaflet but with normal leaflet   . Mitral annular calcification     Mild  . Hypercholesterolemia   . Brain tumor   . 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 Gallbladder sludge   . Colon polyps   . Arthritis     rheumatoid arthritis   Past Surgical History  Procedure Laterality Date  . Tubal ligation  1981  . Rt knee surg  1991  . Lt eye implant  1998   Current Outpatient Prescriptions on File Prior to Visit  Medication Sig Dispense Refill  . amLODipine (NORVASC) 10 MG tablet TAKE 1 TABLET BY MOUTH EVERY DAY 30 tablet 11  . Ascorbic Acid (VITAMIN C) 1000 MG tablet Take 1,000 mg by mouth daily.      Marland Kitchen aspirin 81 MG chewable tablet Chew 81 mg by mouth daily.      Marland Kitchen atenolol (TENORMIN) 50 MG tablet TAKE 1 TABLET (50 MG TOTAL) BY MOUTH DAILY. 30 tablet 5  . atorvastatin  (LIPITOR) 80 MG tablet TAKE 1/2 TABLET BY MOUTH DAILY 30 tablet 4  . Blood Glucose Monitoring Suppl (ACCU-CHEK AVIVA) device E11.9 1 each 0  . Cholecalciferol (VITAMIN D) 2000 UNITS tablet Take 1,000 Units by mouth daily.     . Coenzyme Q10 (CO Q-10) 100 MG CAPS Take by mouth.    . fenofibrate 54 MG tablet TAKE 1 TABLET EVERY DAY 30 tablet 5  . glucose blood (ACCU-CHEK AVIVA) test strip Check BS qd - DX - E11.9 50 each 12  . Lancet Devices (ACCU-CHEK SOFTCLIX) lancets Check BS qd - DX- E11.9 1 each 5  . losartan-hydrochlorothiazide (HYZAAR) 100-25 MG per tablet TAKE 1 TABLET BY MOUTH EVERY DAY 30 tablet 11  . Polyethyl Glycol-Propyl Glycol (SYSTANE) 0.4-0.3 % SOLN Apply to eye.    . pyridOXINE (VITAMIN B-6) 100 MG tablet Take 100 mg by mouth daily as needed.      No current facility-administered medications on file prior to visit.   Allergies  Allergen Reactions  . Vytorin [Ezetimibe-Simvastatin] Other (See Comments)    myalgia   History   Social History  . Marital Status: Widowed    Spouse Name: N/A  .  Number of Children: N/A  . Years of Education: N/A   Occupational History  . Not on file.   Social History Main Topics  . Smoking status: Former Smoker    Quit date: 09/07/1973  . Smokeless tobacco: Never Used  . Alcohol Use: No  . Drug Use: No  . Sexual Activity: Not on file   Other Topics Concern  . Not on file   Social History Narrative   Family History  Problem Relation Age of Onset  . Stroke Paternal Grandmother   . Diabetes Mother   . Diabetes Sister   . Diabetes Grandchild      Review of Systems  All other systems reviewed and are negative.      Objective:   Physical Exam  Constitutional: She is oriented to person, place, and time. She appears well-developed and well-nourished. No distress.  HENT:  Head: Normocephalic and atraumatic.  Right Ear: External ear normal.  Left Ear: External ear normal.  Nose: Nose normal.  Mouth/Throat: Oropharynx is  clear and moist. No oropharyngeal exudate.  Eyes: Conjunctivae and EOM are normal. Pupils are equal, round, and reactive to light. Right eye exhibits no discharge. Left eye exhibits no discharge. No scleral icterus.  Neck: Normal range of motion. Neck supple. No JVD present. No tracheal deviation present. No thyromegaly present.  Cardiovascular: Normal rate, regular rhythm, normal heart sounds and intact distal pulses.  Exam reveals no gallop and no friction rub.   No murmur heard. Pulmonary/Chest: Effort normal and breath sounds normal. No stridor. No respiratory distress. She has no wheezes. She has no rales. She exhibits no tenderness.  Abdominal: Soft. Bowel sounds are normal. She exhibits no distension and no mass. There is no tenderness. There is no rebound and no guarding.  Musculoskeletal: Normal range of motion. She exhibits no edema or tenderness.  Lymphadenopathy:    She has no cervical adenopathy.  Neurological: She is alert and oriented to person, place, and time. She has normal reflexes. She displays normal reflexes. No cranial nerve deficit. She exhibits normal muscle tone. Coordination normal.  Skin: Skin is warm. No rash noted. She is not diaphoretic. No erythema. No pallor.  Psychiatric: She has a normal mood and affect. Her behavior is normal. Judgment and thought content normal.  Vitals reviewed.         Assessment & Plan:  HLD (hyperlipidemia)  Prediabetes - Plan: COMPLETE METABOLIC PANEL WITH GFR, CBC with Differential/Platelet, Lipid panel, Hemoglobin A1c  Routine general medical examination at a health care facility  Patient's blood pressure is elevated. I have asked her to check her blood pressure daily for the next 2 weeks and provide the values to me so that I can review them. If greater than 140/90 I would increase her blood pressure medication to compensate.  I would increase atenolol to 100 mg a day if necessary. I will also check a CBC, CMP, fasting lipid  panel. Goal LDL cholesterol is less than 130. Given her prediabetes I will check a hemoglobin A1c. I have recommended a shingles vaccine. The remainder of her cancer screening and immunizations are up-to-date. I will schedule the patient for a bone density test

## 2014-12-05 LAB — HEMOGLOBIN A1C
Hgb A1c MFr Bld: 6.1 % — ABNORMAL HIGH (ref <5.7)
Mean Plasma Glucose: 128 mg/dL — ABNORMAL HIGH (ref <117)

## 2014-12-16 ENCOUNTER — Other Ambulatory Visit: Payer: Self-pay | Admitting: Family Medicine

## 2014-12-17 ENCOUNTER — Telehealth: Payer: Self-pay | Admitting: Family Medicine

## 2014-12-17 NOTE — Telephone Encounter (Signed)
Refill appropriate and filled per protocol. 

## 2014-12-17 NOTE — Telephone Encounter (Signed)
-----   Message from Susy Frizzle, MD sent at 12/15/2014  7:15 AM EDT ----- Notify patient I have reviewed her blood pressures and they all look good. I would not change any medications at this time. Phone number is [phone]

## 2014-12-17 NOTE — Telephone Encounter (Signed)
Pt called and made aware

## 2015-01-11 ENCOUNTER — Other Ambulatory Visit: Payer: Self-pay | Admitting: Family Medicine

## 2015-02-16 ENCOUNTER — Ambulatory Visit (INDEPENDENT_AMBULATORY_CARE_PROVIDER_SITE_OTHER): Payer: Commercial Managed Care - HMO | Admitting: *Deleted

## 2015-02-16 DIAGNOSIS — Z23 Encounter for immunization: Secondary | ICD-10-CM

## 2015-02-21 ENCOUNTER — Other Ambulatory Visit: Payer: Self-pay | Admitting: Family Medicine

## 2015-02-24 ENCOUNTER — Other Ambulatory Visit: Payer: Self-pay | Admitting: Family Medicine

## 2015-02-24 DIAGNOSIS — Z1231 Encounter for screening mammogram for malignant neoplasm of breast: Secondary | ICD-10-CM

## 2015-03-08 ENCOUNTER — Ambulatory Visit (INDEPENDENT_AMBULATORY_CARE_PROVIDER_SITE_OTHER): Payer: Commercial Managed Care - HMO | Admitting: Family Medicine

## 2015-03-08 ENCOUNTER — Encounter: Payer: Self-pay | Admitting: Family Medicine

## 2015-03-08 VITALS — BP 142/80 | HR 74 | Temp 98.5°F | Resp 16 | Ht 64.0 in | Wt 258.0 lb

## 2015-03-08 DIAGNOSIS — R7309 Other abnormal glucose: Secondary | ICD-10-CM | POA: Diagnosis not present

## 2015-03-08 DIAGNOSIS — M150 Primary generalized (osteo)arthritis: Secondary | ICD-10-CM

## 2015-03-08 DIAGNOSIS — E785 Hyperlipidemia, unspecified: Secondary | ICD-10-CM | POA: Diagnosis not present

## 2015-03-08 DIAGNOSIS — I10 Essential (primary) hypertension: Secondary | ICD-10-CM | POA: Diagnosis not present

## 2015-03-08 DIAGNOSIS — R7303 Prediabetes: Secondary | ICD-10-CM

## 2015-03-08 DIAGNOSIS — M159 Polyosteoarthritis, unspecified: Secondary | ICD-10-CM

## 2015-03-08 LAB — CBC WITH DIFFERENTIAL/PLATELET
BASOS PCT: 0 % (ref 0–1)
Basophils Absolute: 0 10*3/uL (ref 0.0–0.1)
Eosinophils Absolute: 0.2 10*3/uL (ref 0.0–0.7)
Eosinophils Relative: 2 % (ref 0–5)
HEMATOCRIT: 34.8 % — AB (ref 36.0–46.0)
HEMOGLOBIN: 11.8 g/dL — AB (ref 12.0–15.0)
Lymphocytes Relative: 40 % (ref 12–46)
Lymphs Abs: 3.2 10*3/uL (ref 0.7–4.0)
MCH: 24.9 pg — AB (ref 26.0–34.0)
MCHC: 33.9 g/dL (ref 30.0–36.0)
MCV: 73.6 fL — ABNORMAL LOW (ref 78.0–100.0)
MPV: 9.5 fL (ref 8.6–12.4)
Monocytes Absolute: 0.6 10*3/uL (ref 0.1–1.0)
Monocytes Relative: 8 % (ref 3–12)
NEUTROS ABS: 4.1 10*3/uL (ref 1.7–7.7)
NEUTROS PCT: 50 % (ref 43–77)
Platelets: 364 10*3/uL (ref 150–400)
RBC: 4.73 MIL/uL (ref 3.87–5.11)
RDW: 16.4 % — ABNORMAL HIGH (ref 11.5–15.5)
WBC: 8.1 10*3/uL (ref 4.0–10.5)

## 2015-03-08 LAB — COMPLETE METABOLIC PANEL WITH GFR
ALBUMIN: 4 g/dL (ref 3.6–5.1)
ALK PHOS: 48 U/L (ref 33–130)
ALT: 14 U/L (ref 6–29)
AST: 17 U/L (ref 10–35)
BILIRUBIN TOTAL: 0.4 mg/dL (ref 0.2–1.2)
BUN: 20 mg/dL (ref 7–25)
CALCIUM: 9.9 mg/dL (ref 8.6–10.4)
CO2: 26 mmol/L (ref 20–31)
Chloride: 103 mmol/L (ref 98–110)
Creat: 0.75 mg/dL (ref 0.60–0.93)
GFR, EST NON AFRICAN AMERICAN: 79 mL/min (ref >=60)
GFR, Est African American: >89 mL/min (ref >=60)
GLUCOSE: 97 mg/dL (ref 70–99)
Potassium: 3.8 mmol/L (ref 3.5–5.3)
SODIUM: 141 mmol/L (ref 135–146)
TOTAL PROTEIN: 7.7 g/dL (ref 6.1–8.1)

## 2015-03-08 LAB — LIPID PANEL
Cholesterol: 168 mg/dL (ref 125–200)
HDL: 60 mg/dL (ref >=46)
LDL CALC: 94 mg/dL (ref <130)
Total CHOL/HDL Ratio: 2.8 Ratio (ref <=5.0)
Triglycerides: 68 mg/dL (ref <150)
VLDL: 14 mg/dL (ref <30)

## 2015-03-08 MED ORDER — DICLOFENAC SODIUM 75 MG PO TBEC: 75.0000 mg | DELAYED_RELEASE_TABLET | Freq: Two times a day (BID) | ORAL | Status: DC

## 2015-03-08 NOTE — Progress Notes (Signed)
Subjective:    Patient ID: [name], female    DOB: [birth year], 74 y.o.   MRN: [id number]  HPI  12/04/14 Patient is here today for complete physical exam. She also complains of arthritis in her hands and her knees and in her lower back. She would like to try an NSAID sparingly to help manage her arthritic pain. Her blood pressure is elevated today 152/72 however she is going to a funeral and she believes anxiety is elevating her blood pressure. She states that she checks her blood pressure at home and it is typically less than 130/60. Last mammogram was in November 2015 and was normal. Due to her age she does not require a Pap smear. Last colonoscopy was 2015. It did show tubular adenomas and it is due for repeat colonoscopy in 2020. Immunizations are up-to-date including Prevnar as well as Pneumovax. She is due for the shingles vaccine.  At that time, my plan was: Patient's blood pressure is elevated. I have asked her to check her blood pressure daily for the next 2 weeks and provide the values to me so that I can review them. If greater than 140/90 I would increase her blood pressure medication to compensate.  I would increase atenolol to 100 mg a day if necessary. I will also check a CBC, CMP, fasting lipid panel. Goal LDL cholesterol is less than 130. Given her prediabetes I will check a hemoglobin A1c. I have recommended a shingles vaccine. The remainder of her cancer screening and immunizations are up-to-date. I will schedule the patient for a bone density test  03/08/15 She is here today for follow-up. She continues to complain of arthritis in her hands in her knee and in her lower back. This causes swelling in her PIP joints and MCP joints on both hands. She also has crepitus in both knee joints. She has not been taking any NSAID. Her blood pressure today is acceptable at 142/80. She denies any chest pain shortness of breath or dyspnea on exertion. She is due to recheck her  cholesterol. She is also due to recheck her blood sugar. She was found to have hyperglycemia with a fasting blood sugar of 108. Past Medical History  Diagnosis Date  . Chest pain   . Dyspepsia   . Hypertension   . Left atrial dilatation     Mild  . Mild aortic sclerosis (HCC)     without stenosis  . Mitral valve disorder     Mildly thickened mitral valve, but with normal leaflet but with normal leaflet   . Mitral annular calcification     Mild  . Hypercholesterolemia   . Brain tumor ([place])   . 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 Gallbladder sludge   . Colon polyps   . Arthritis     rheumatoid arthritis   Past Surgical History  Procedure Laterality Date  . Tubal ligation  1981  . Rt knee surg  1991  . Lt eye implant  1998   Current Outpatient Prescriptions on File Prior to Visit  Medication Sig Dispense Refill  . amLODipine (NORVASC) 10 MG tablet TAKE 1 TABLET BY MOUTH EVERY DAY 30 tablet 11  . Ascorbic Acid (VITAMIN C) 1000 MG tablet Take 1,000 mg by mouth daily.      Marland Kitchen aspirin 81 MG chewable tablet Chew 81 mg by mouth daily.      Marland Kitchen atenolol (TENORMIN)  50 MG tablet TAKE 1 TABLET (50 MG TOTAL) BY MOUTH DAILY. 30 tablet 11  . atorvastatin (LIPITOR) 80 MG tablet TAKE 1/2 TABLET BY MOUTH DAILY 30 tablet 4  . Blood Glucose Monitoring Suppl (ACCU-CHEK AVIVA) device E11.9 1 each 0  . Cholecalciferol (VITAMIN D) 2000 UNITS tablet Take 1,000 Units by mouth daily.     . Coenzyme Q10 (CO Q-10) 100 MG CAPS Take by mouth.    . fenofibrate 54 MG tablet TAKE 1 TABLET EVERY DAY 30 tablet 5  . glucose blood (ACCU-CHEK AVIVA) test strip Check BS qd - DX - E11.9 50 each 12  . Lancet Devices (ACCU-CHEK SOFTCLIX) lancets Check BS qd - DX- E11.9 1 each 5  . losartan-hydrochlorothiazide (HYZAAR) 100-25 MG per tablet TAKE 1 TABLET BY MOUTH EVERY DAY 30 tablet 11  . Polyethyl Glycol-Propyl Glycol (SYSTANE) 0.4-0.3 % SOLN Apply to eye.    . pyridOXINE  (VITAMIN B-6) 100 MG tablet Take 100 mg by mouth daily as needed.     . meloxicam (MOBIC) 15 MG tablet Take 1 tablet (15 mg total) by mouth daily. (Patient not taking: Reported on 03/08/2015) 30 tablet 0   No current facility-administered medications on file prior to visit.   Allergies  Allergen Reactions  . Vytorin [Ezetimibe-Simvastatin] Other (See Comments)    myalgia   Social History   Social History  . Marital Status: Widowed    Spouse Name: N/A  . Number of Children: N/A  . Years of Education: N/A   Occupational History  . Not on file.   Social History Main Topics  . Smoking status: Former Smoker    Quit date: 09/07/1973  . Smokeless tobacco: Never Used  . Alcohol Use: No  . Drug Use: No  . Sexual Activity: Not on file   Other Topics Concern  . Not on file   Social History Narrative   Family History  Problem Relation Age of Onset  . Stroke Paternal Grandmother   . Diabetes Mother   . Diabetes Sister   . Diabetes Grandchild      Review of Systems  All other systems reviewed and are negative.      Objective:   Physical Exam  Constitutional: She is oriented to person, place, and time. She appears well-developed and well-nourished. No distress.  HENT:  Head: Normocephalic and atraumatic.  Right Ear: External ear normal.  Left Ear: External ear normal.  Nose: Nose normal.  Mouth/Throat: Oropharynx is clear and moist. No oropharyngeal exudate.  Eyes: Conjunctivae and EOM are normal. Pupils are equal, round, and reactive to light. Right eye exhibits no discharge. Left eye exhibits no discharge. No scleral icterus.  Neck: Normal range of motion. Neck supple. No JVD present. No tracheal deviation present. No thyromegaly present.  Cardiovascular: Normal rate, regular rhythm, normal heart sounds and intact distal pulses.  Exam reveals no gallop and no friction rub.   No murmur heard. Pulmonary/Chest: Effort normal and breath sounds normal. No stridor. No  respiratory distress. She has no wheezes. She has no rales. She exhibits no tenderness.  Abdominal: Soft. Bowel sounds are normal. She exhibits no distension and no mass. There is no tenderness. There is no rebound and no guarding.  Musculoskeletal: Normal range of motion. She exhibits no edema or tenderness.  Lymphadenopathy:    She has no cervical adenopathy.  Neurological: She is alert and oriented to person, place, and time. She has normal reflexes. No cranial nerve deficit. She exhibits normal muscle tone.  Coordination normal.  Skin: Skin is warm. No rash noted. She is not diaphoretic. No erythema. No pallor.  Psychiatric: She has a normal mood and affect. Her behavior is normal. Judgment and thought content normal.  Vitals reviewed.         Assessment & Plan:  Primary osteoarthritis involving multiple joints - Plan: diclofenac (VOLTAREN) 75 MG EC tablet  Essential hypertension  HLD (hyperlipidemia) - Plan: CBC with Differential/Platelet, COMPLETE METABOLIC PANEL WITH GFR, Lipid panel  Prediabetes - Plan: Hemoglobin A1c blood pressure is acceptable. I will check a fasting lipid panel. Goal LDL cholesterol is less than 130. I will also check a hemoglobin A1c. The patient can try diclofenac 75 mg by mouth twice a day sparingly to be used for joint pains.

## 2015-03-09 LAB — HEMOGLOBIN A1C
HEMOGLOBIN A1C: 5.9 % — AB (ref <5.7)
Mean Plasma Glucose: 123 mg/dL — ABNORMAL HIGH (ref <117)

## 2015-03-18 ENCOUNTER — Telehealth: Payer: Self-pay | Admitting: *Deleted

## 2015-03-18 NOTE — Telephone Encounter (Signed)
Pt is taking Voltaren tablets 75mg  and states she is having dark tarry stools last couple times with one being this morning, pt states she has stopped taking the medications this week as instructed. Pt is not having any other side effects, wants to know if there is something she needs to be concerned about and if there is something else you recommend for her to take. pLease advise!  [facility] [address]

## 2015-03-18 NOTE — Telephone Encounter (Signed)
Stool cards x 3 to check for blood.

## 2015-03-19 ENCOUNTER — Ambulatory Visit (HOSPITAL_COMMUNITY)
Admission: RE | Admit: 2015-03-19 | Discharge: 2015-03-19 | Disposition: A | Discharge status: Home / Self Care | Payer: Commercial Managed Care - HMO | Source: Ambulatory Visit | Attending: Family Medicine | Admitting: Family Medicine

## 2015-03-19 ENCOUNTER — Other Ambulatory Visit: Payer: Self-pay | Admitting: Family Medicine

## 2015-03-19 DIAGNOSIS — Z1231 Encounter for screening mammogram for malignant neoplasm of breast: Secondary | ICD-10-CM | POA: Insufficient documentation

## 2015-03-19 DIAGNOSIS — K921 Melena: Secondary | ICD-10-CM

## 2015-03-19 NOTE — Telephone Encounter (Signed)
Pt aware to come and pick up stool cards, pt will come pick up today

## 2015-03-24 ENCOUNTER — Ambulatory Visit (HOSPITAL_COMMUNITY): Payer: Self-pay

## 2015-05-04 ENCOUNTER — Encounter: Payer: Self-pay | Admitting: *Deleted

## 2015-05-04 DIAGNOSIS — I700 Atherosclerosis of aorta: Secondary | ICD-10-CM | POA: Insufficient documentation

## 2015-06-10 ENCOUNTER — Encounter: Payer: Self-pay | Admitting: Family Medicine

## 2015-06-10 ENCOUNTER — Ambulatory Visit (INDEPENDENT_AMBULATORY_CARE_PROVIDER_SITE_OTHER): Payer: Commercial Managed Care - HMO | Admitting: Family Medicine

## 2015-06-10 VITALS — BP 140/70 | HR 82 | Temp 98.4°F | Resp 16 | Ht 64.0 in | Wt 258.0 lb

## 2015-06-10 DIAGNOSIS — R7303 Prediabetes: Secondary | ICD-10-CM

## 2015-06-10 DIAGNOSIS — E785 Hyperlipidemia, unspecified: Secondary | ICD-10-CM | POA: Diagnosis not present

## 2015-06-10 DIAGNOSIS — R7309 Other abnormal glucose: Secondary | ICD-10-CM | POA: Diagnosis not present

## 2015-06-10 LAB — HEMOGLOBIN A1C
HEMOGLOBIN A1C: 5.9 % — AB (ref <5.7)
Mean Plasma Glucose: 123 mg/dL — ABNORMAL HIGH (ref <117)

## 2015-06-10 LAB — COMPLETE METABOLIC PANEL WITH GFR
ALT: 17 U/L (ref 6–29)
AST: 20 U/L (ref 10–35)
Albumin: 4.2 g/dL (ref 3.6–5.1)
Alkaline Phosphatase: 45 U/L (ref 33–130)
BUN: 19 mg/dL (ref 7–25)
CHLORIDE: 102 mmol/L (ref 98–110)
CO2: 25 mmol/L (ref 20–31)
Calcium: 9.9 mg/dL (ref 8.6–10.4)
Creat: 0.89 mg/dL (ref 0.60–0.93)
GFR, EST AFRICAN AMERICAN: 74 mL/min (ref >=60)
GFR, EST NON AFRICAN AMERICAN: 65 mL/min (ref >=60)
GLUCOSE: 93 mg/dL (ref 70–99)
Potassium: 3.6 mmol/L (ref 3.5–5.3)
SODIUM: 140 mmol/L (ref 135–146)
Total Bilirubin: 0.4 mg/dL (ref 0.2–1.2)
Total Protein: 7.5 g/dL (ref 6.1–8.1)

## 2015-06-10 LAB — LIPID PANEL
Cholesterol: 190 mg/dL (ref 125–200)
HDL: 63 mg/dL (ref >=46)
LDL CALC: 112 mg/dL (ref <130)
TRIGLYCERIDES: 75 mg/dL (ref <150)
Total CHOL/HDL Ratio: 3 Ratio (ref <=5.0)
VLDL: 15 mg/dL (ref <30)

## 2015-06-10 NOTE — Progress Notes (Signed)
Subjective:    Patient ID: [name], female    DOB: 1941-02-08, 75 y.o.   MRN: [id number]  Diabetes   She is here today for follow-up.  Her blood pressure today is acceptable at 140/70. She denies any chest pain shortness of breath or dyspnea on exertion. She is due to recheck her cholesterol. She is also due to recheck her blood sugar. She was found to have hyperglycemia with a fasting blood sugar of 108.   She has prediabetes along with hyperlipidemia. She denies any myalgias or right upper quadrant pain. She's been taking coenzyme Q 10 which seems to help with the myalgias. Past Medical History  Diagnosis Date  . Chest pain   . Dyspepsia   . Hypertension   . Left atrial dilatation     Mild  . Mild aortic sclerosis (HCC)     without stenosis  . Mitral valve disorder     Mildly thickened mitral valve, but with normal leaflet but with normal leaflet   . Mitral annular calcification     Mild  . Hypercholesterolemia   . Brain tumor ([place])   . 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 Gallbladder sludge   . Colon polyps   . Arthritis     rheumatoid arthritis   Past Surgical History  Procedure Laterality Date  . Tubal ligation  1981  . Rt knee surg  1991  . Lt eye implant  1998   Current Outpatient Prescriptions on File Prior to Visit  Medication Sig Dispense Refill  . amLODipine (NORVASC) 10 MG tablet TAKE 1 TABLET BY MOUTH EVERY DAY 30 tablet 11  . Ascorbic Acid (VITAMIN C) 1000 MG tablet Take 1,000 mg by mouth daily.      Marland Kitchen aspirin 81 MG chewable tablet Chew 81 mg by mouth daily.      Marland Kitchen atenolol (TENORMIN) 50 MG tablet TAKE 1 TABLET (50 MG TOTAL) BY MOUTH DAILY. 30 tablet 11  . atorvastatin (LIPITOR) 80 MG tablet TAKE 1/2 TABLET BY MOUTH DAILY 30 tablet 4  . Cholecalciferol (VITAMIN D) 2000 UNITS tablet Take 1,000 Units by mouth daily.     . Coenzyme Q10 (CO Q-10) 100 MG CAPS Take by mouth.    . fenofibrate 54 MG tablet TAKE  1 TABLET EVERY DAY 30 tablet 5  . glucose blood (ACCU-CHEK AVIVA) test strip Check BS qd - DX - E11.9 50 each 12  . Lancet Devices (ACCU-CHEK SOFTCLIX) lancets Check BS qd - DX- E11.9 1 each 5  . losartan-hydrochlorothiazide (HYZAAR) 100-25 MG per tablet TAKE 1 TABLET BY MOUTH EVERY DAY 30 tablet 11  . Omega-3 Fatty Acids (FISH OIL CONCENTRATE PO) Take by mouth.    Vladimir Faster Glycol-Propyl Glycol (SYSTANE) 0.4-0.3 % SOLN Apply to eye.    . pyridOXINE (VITAMIN B-6) 100 MG tablet Take 100 mg by mouth daily as needed.      No current facility-administered medications on file prior to visit.   Allergies  Allergen Reactions  . Vytorin [Ezetimibe-Simvastatin] Other (See Comments)    myalgia   Social History   Social History  . Marital Status: Widowed    Spouse Name: N/A  . Number of Children: N/A  . Years of Education: N/A   Occupational History  . Not on file.   Social History Main Topics  . Smoking status: Former Smoker    Quit date: 09/07/1973  . Smokeless  tobacco: Never Used  . Alcohol Use: No  . Drug Use: No  . Sexual Activity: Not on file   Other Topics Concern  . Not on file   Social History Narrative   Family History  Problem Relation Age of Onset  . Stroke Paternal Grandmother   . Diabetes Mother   . Diabetes Sister   . Diabetes Grandchild      Review of Systems  All other systems reviewed and are negative.      Objective:   Physical Exam  Constitutional: She is oriented to person, place, and time. She appears well-developed and well-nourished. No distress.  HENT:  Head: Normocephalic and atraumatic.  Right Ear: External ear normal.  Left Ear: External ear normal.  Nose: Nose normal.  Mouth/Throat: Oropharynx is clear and moist. No oropharyngeal exudate.  Eyes: Conjunctivae and EOM are normal. Pupils are equal, round, and reactive to light. Right eye exhibits no discharge. Left eye exhibits no discharge. No scleral icterus.  Neck: Normal range of  motion. Neck supple. No JVD present. No tracheal deviation present. No thyromegaly present.  Cardiovascular: Normal rate, regular rhythm, normal heart sounds and intact distal pulses.  Exam reveals no gallop and no friction rub.   No murmur heard. Pulmonary/Chest: Effort normal and breath sounds normal. No stridor. No respiratory distress. She has no wheezes. She has no rales. She exhibits no tenderness.  Abdominal: Soft. Bowel sounds are normal. She exhibits no distension and no mass. There is no tenderness. There is no rebound and no guarding.  Musculoskeletal: Normal range of motion. She exhibits no edema or tenderness.  Lymphadenopathy:    She has no cervical adenopathy.  Neurological: She is alert and oriented to person, place, and time. She has normal reflexes. No cranial nerve deficit. She exhibits normal muscle tone. Coordination normal.  Skin: Skin is warm. No rash noted. She is not diaphoretic. No erythema. No pallor.  Psychiatric: She has a normal mood and affect. Her behavior is normal. Judgment and thought content normal.  Vitals reviewed.         Assessment & Plan:  Prediabetes - Plan: COMPLETE METABOLIC PANEL WITH GFR, Lipid panel, Hemoglobin A1c blood pressure is acceptable. I will check a fasting lipid panel. Goal LDL cholesterol is less than 130. I will also check a hemoglobin A1c.  Her flu shot is up-to-date. Pneumovax 23 and Prevnar 13 are up-to-date. Her colonoscopy and her mammogram are up-to-date. Recheck in 6 months

## 2015-06-11 ENCOUNTER — Encounter: Payer: Self-pay | Admitting: Family Medicine

## 2015-08-20 ENCOUNTER — Other Ambulatory Visit: Payer: Self-pay | Admitting: Family Medicine

## 2015-08-25 ENCOUNTER — Other Ambulatory Visit: Payer: Self-pay | Admitting: Family Medicine

## 2015-08-25 MED ORDER — FENOFIBRATE 54 MG PO TABS: 54.0000 mg | ORAL_TABLET | Freq: Every day | ORAL | Status: DC

## 2015-09-19 ENCOUNTER — Other Ambulatory Visit: Payer: Self-pay | Admitting: Family Medicine

## 2015-10-06 ENCOUNTER — Other Ambulatory Visit: Payer: Self-pay | Admitting: Family Medicine

## 2015-11-10 ENCOUNTER — Other Ambulatory Visit: Payer: Self-pay | Admitting: Family Medicine

## 2015-11-11 ENCOUNTER — Other Ambulatory Visit: Payer: Self-pay | Admitting: Family Medicine

## 2015-11-11 NOTE — Telephone Encounter (Signed)
Refill appropriate and filled per protocol. 

## 2015-12-07 DIAGNOSIS — H5210 Myopia, unspecified eye: Secondary | ICD-10-CM | POA: Diagnosis not present

## 2015-12-07 DIAGNOSIS — H524 Presbyopia: Secondary | ICD-10-CM | POA: Diagnosis not present

## 2015-12-13 ENCOUNTER — Encounter: Payer: Self-pay | Admitting: Family Medicine

## 2015-12-13 ENCOUNTER — Ambulatory Visit (INDEPENDENT_AMBULATORY_CARE_PROVIDER_SITE_OTHER): Payer: Commercial Managed Care - HMO | Admitting: Family Medicine

## 2015-12-13 VITALS — BP 160/80 | HR 62 | Temp 98.2°F | Resp 18 | Ht 64.0 in | Wt 262.0 lb

## 2015-12-13 DIAGNOSIS — K921 Melena: Secondary | ICD-10-CM | POA: Diagnosis not present

## 2015-12-13 DIAGNOSIS — R7303 Prediabetes: Secondary | ICD-10-CM | POA: Diagnosis not present

## 2015-12-13 LAB — CBC WITH DIFFERENTIAL/PLATELET
BASOS PCT: 0 %
Basophils Absolute: 0 cells/uL (ref 0–200)
Eosinophils Absolute: 150 cells/uL (ref 15–500)
Eosinophils Relative: 2 %
HEMATOCRIT: 37.9 % (ref 35.0–45.0)
HEMOGLOBIN: 12.6 g/dL (ref 12.0–15.0)
LYMPHS ABS: 3450 {cells}/uL (ref 850–3900)
Lymphocytes Relative: 46 %
MCH: 26.5 pg — ABNORMAL LOW (ref 27.0–33.0)
MCHC: 33.2 g/dL (ref 32.0–36.0)
MCV: 79.6 fL — ABNORMAL LOW (ref 80.0–100.0)
MONO ABS: 525 {cells}/uL (ref 200–950)
MPV: 10.1 fL (ref 7.5–12.5)
Monocytes Relative: 7 %
NEUTROS PCT: 45 %
Neutro Abs: 3375 cells/uL (ref 1500–7800)
Platelets: 280 10*3/uL (ref 140–400)
RBC: 4.76 MIL/uL (ref 3.80–5.10)
RDW: 15.5 % — ABNORMAL HIGH (ref 11.0–15.0)
WBC: 7.5 10*3/uL (ref 3.8–10.8)

## 2015-12-13 LAB — COMPLETE METABOLIC PANEL WITH GFR
ALBUMIN: 4.3 g/dL (ref 3.6–5.1)
ALK PHOS: 40 U/L (ref 33–130)
ALT: 23 U/L (ref 6–29)
AST: 24 U/L (ref 10–35)
BUN: 22 mg/dL (ref 7–25)
CALCIUM: 9.8 mg/dL (ref 8.6–10.4)
CO2: 27 mmol/L (ref 20–31)
CREATININE: 0.9 mg/dL (ref 0.60–0.93)
Chloride: 104 mmol/L (ref 98–110)
GFR, Est African American: 73 mL/min (ref >=60)
GFR, Est Non African American: 63 mL/min (ref >=60)
Glucose, Bld: 98 mg/dL (ref 70–99)
Potassium: 4.2 mmol/L (ref 3.5–5.3)
Sodium: 139 mmol/L (ref 135–146)
Total Bilirubin: 0.5 mg/dL (ref 0.2–1.2)
Total Protein: 7.3 g/dL (ref 6.1–8.1)

## 2015-12-13 LAB — LIPID PANEL
CHOLESTEROL: 184 mg/dL (ref 125–200)
HDL: 74 mg/dL (ref >=46)
LDL Cholesterol: 96 mg/dL (ref <130)
Total CHOL/HDL Ratio: 2.5 Ratio (ref <=5.0)
Triglycerides: 68 mg/dL (ref <150)
VLDL: 14 mg/dL (ref <30)

## 2015-12-13 LAB — HEMOGLOBIN A1C
Hgb A1c MFr Bld: 5.8 % — ABNORMAL HIGH (ref <5.7)
MEAN PLASMA GLUCOSE: 120 mg/dL

## 2015-12-13 NOTE — Progress Notes (Signed)
Subjective:    Patient ID: [name], female    DOB: 11-13-1940, 75 y.o.   MRN: [id number]  Diabetes   Medication Refill   1/17 She is here today for follow-up.  Her blood pressure today is acceptable at 140/70. She denies any chest pain shortness of breath or dyspnea on exertion. She is due to recheck her cholesterol. She is also due to recheck her blood sugar. She was found to have hyperglycemia with a fasting blood sugar of 108.   She has prediabetes along with hyperlipidemia. She denies any myalgias or right upper quadrant pain. She's been taking coenzyme Q 10 which seems to help with the myalgias.  At that time, my plan was: blood pressure is acceptable. I will check a fasting lipid panel. Goal LDL cholesterol is less than 130. I will also check a hemoglobin A1c.  Her flu shot is up-to-date. Pneumovax 23 and Prevnar 13 are up-to-date. Her colonoscopy and her mammogram are up-to-date. Recheck in 6 months  12/13/15 Patient is here today for follow-up. She does have one concern. She has noticed 2 episodes of black tarry stool. However she is also taking iron pills due to anemia. The changes in her stools seem to coincide with when she took the iron pills. She denies any bright red blood per rectum. She denies any chest pain shortness of breath dyspnea on exertion. She denies any epigastric abdominal pain. Her blood pressure today is extremely high. I checked this and verified it myself. However the patient is extremely anxious because of the black tarry stools. She states that at home her blood pressures have been WELL controlled between A999333 systolic.   Past Medical History:  Diagnosis Date  . Arthritis    rheumatoid arthritis  . Brain tumor ([place])   . Chest pain   . Colon polyps   . Dyspepsia   . Gallbladder sludge   . Hypercholesterolemia   . Hypertension   . Left atrial dilatation    Mild  . Mild aortic sclerosis (HCC)    without stenosis  . Mitral annular  calcification    Mild  . Mitral valve disorder    Mildly thickened mitral valve, but with normal leaflet but with normal leaflet   . 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 lt eye implant  1998  . rt knee surg  1991  . TUBAL LIGATION  1981   Current Outpatient Prescriptions on File Prior to Visit  Medication Sig Dispense Refill  . ACCU-CHEK AVIVA PLUS test strip CHECK DAILY 50 each 1  . amLODipine (NORVASC) 10 MG tablet TAKE 1 TABLET BY MOUTH EVERY DAY 30 tablet 11  . Ascorbic Acid (VITAMIN C) 1000 MG tablet Take 1,000 mg by mouth daily.      Marland Kitchen aspirin 81 MG chewable tablet Chew 81 mg by mouth daily.      Marland Kitchen atenolol (TENORMIN) 50 MG tablet TAKE 1 TABLET (50 MG TOTAL) BY MOUTH DAILY. 30 tablet 5  . atorvastatin (LIPITOR) 80 MG tablet TAKE 1/2 TABLET BY MOUTH DAILY 30 tablet 4  . Cholecalciferol (VITAMIN D) 2000 UNITS tablet Take 1,000 Units by mouth daily.     . Coenzyme Q10 (CO Q-10) 100 MG CAPS Take by mouth.    . fenofibrate 54 MG tablet Take 1 tablet (54 mg total) by mouth daily. 30 tablet 5  .  Lancet Devices (ACCU-CHEK SOFTCLIX) lancets Check BS qd - DX- E11.9 1 each 5  . losartan-hydrochlorothiazide (HYZAAR) 100-25 MG tablet TAKE 1 TABLET BY MOUTH EVERY DAY 30 tablet 11  . Omega-3 Fatty Acids (FISH OIL CONCENTRATE PO) Take by mouth.    Vladimir Faster Glycol-Propyl Glycol (SYSTANE) 0.4-0.3 % SOLN Apply to eye.    . pyridOXINE (VITAMIN B-6) 100 MG tablet Take 100 mg by mouth daily as needed.      No current facility-administered medications on file prior to visit.    Allergies  Allergen Reactions  . Vytorin [Ezetimibe-Simvastatin] Other (See Comments)    myalgia   Social History   Social History  . Marital status: Widowed    Spouse name: N/A  . Number of children: N/A  . Years of education: N/A   Occupational History  . Not on file.   Social History Main Topics  . Smoking status:  Former Smoker    Quit date: 09/07/1973  . Smokeless tobacco: Never Used  . Alcohol use No  . Drug use: No  . Sexual activity: Not on file   Other Topics Concern  . Not on file   Social History Narrative  . No narrative on file   Family History  Problem Relation Age of Onset  . Stroke Paternal Grandmother   . Diabetes Mother   . Diabetes Sister   . Diabetes Grandchild      Review of Systems  All other systems reviewed and are negative.      Objective:   Physical Exam  Constitutional: She is oriented to person, place, and time. She appears well-developed and well-nourished. No distress.  HENT:  Head: Normocephalic and atraumatic.  Right Ear: External ear normal.  Left Ear: External ear normal.  Nose: Nose normal.  Mouth/Throat: Oropharynx is clear and moist. No oropharyngeal exudate.  Eyes: Conjunctivae and EOM are normal. Pupils are equal, round, and reactive to light. Right eye exhibits no discharge. Left eye exhibits no discharge. No scleral icterus.  Neck: Normal range of motion. Neck supple. No JVD present. No tracheal deviation present. No thyromegaly present.  Cardiovascular: Normal rate, regular rhythm, normal heart sounds and intact distal pulses.  Exam reveals no gallop and no friction rub.   No murmur heard. Pulmonary/Chest: Effort normal and breath sounds normal. No stridor. No respiratory distress. She has no wheezes. She has no rales. She exhibits no tenderness.  Abdominal: Soft. Bowel sounds are normal. She exhibits no distension and no mass. There is no tenderness. There is no rebound and no guarding.  Musculoskeletal: Normal range of motion. She exhibits no edema or tenderness.  Lymphadenopathy:    She has no cervical adenopathy.  Neurological: She is alert and oriented to person, place, and time. She has normal reflexes. No cranial nerve deficit. She exhibits normal muscle tone. Coordination normal.  Skin: Skin is warm. No rash noted. She is not  diaphoretic. No erythema. No pallor.  Psychiatric: She has a normal mood and affect. Her behavior is normal. Judgment and thought content normal.  Vitals reviewed.         Assessment & Plan:  Melena - Plan: Fecal occult blood, imunochemical, Fecal occult blood, imunochemical, Fecal occult blood, imunochemical  Prediabetes - Plan: CBC with Differential/Platelet, COMPLETE METABOLIC PANEL WITH GFR, Lipid panel, Hemoglobin A1c  I believe the black tarry stools or due to the iron supplements she states taking. I recommended that she discontinue the iron supplements for 1 week and then check fecal occult  blood cards 3. If at that time, but a positive for blood, I recommend GI consultation for EGD and possibly a colonoscopy. Her blood pressure today is extremely high but she is also extremely anxious. She will check her blood pressure everyday for the next 2-3 days and notify me on Friday with the values are. If at that time blood pressure readings are still greater than 140/90, I would increase atenolol.  While the patient is here today, I will check a CMP, fasting lipid panel, and hemoglobin A1c pertaining to her prediabetes.

## 2015-12-15 ENCOUNTER — Encounter: Payer: Self-pay | Admitting: Family Medicine

## 2015-12-15 ENCOUNTER — Other Ambulatory Visit: Payer: Commercial Managed Care - HMO

## 2015-12-15 DIAGNOSIS — K921 Melena: Secondary | ICD-10-CM | POA: Diagnosis not present

## 2015-12-16 LAB — FECAL OCCULT BLOOD, IMMUNOCHEMICAL
FECAL OCCULT BLOOD: NEGATIVE
FECAL OCCULT BLOOD: NEGATIVE
Fecal Occult Blood: NEGATIVE

## 2015-12-17 ENCOUNTER — Ambulatory Visit: Payer: Commercial Managed Care - HMO | Admitting: Family Medicine

## 2015-12-17 VITALS — BP 144/70 | HR 60

## 2015-12-17 DIAGNOSIS — I10 Essential (primary) hypertension: Secondary | ICD-10-CM

## 2015-12-22 ENCOUNTER — Other Ambulatory Visit: Payer: Self-pay | Admitting: Family Medicine

## 2015-12-22 MED ORDER — LOSARTAN POTASSIUM-HCTZ 100-25 MG PO TABS: 1.0000 | ORAL_TABLET | Freq: Every day | ORAL | 3 refills | Status: DC

## 2015-12-22 MED ORDER — ATORVASTATIN CALCIUM 80 MG PO TABS: 40.0000 mg | ORAL_TABLET | Freq: Every day | ORAL | 3 refills | Status: DC

## 2015-12-22 MED ORDER — AMLODIPINE BESYLATE 10 MG PO TABS: 10.0000 mg | ORAL_TABLET | Freq: Every day | ORAL | 3 refills | Status: DC

## 2015-12-22 MED ORDER — ATENOLOL 50 MG PO TABS: ORAL_TABLET | ORAL | 3 refills | Status: DC

## 2015-12-22 MED ORDER — GLUCOSE BLOOD VI STRP: ORAL_STRIP | 3 refills | Status: DC

## 2015-12-22 MED ORDER — ACCU-CHEK SOFTCLIX LANCET DEV MISC: 5 refills | Status: AC

## 2015-12-22 MED ORDER — FENOFIBRATE 54 MG PO TABS: 54.0000 mg | ORAL_TABLET | Freq: Every day | ORAL | 3 refills | Status: DC

## 2015-12-22 NOTE — Telephone Encounter (Signed)
Medication called/sent to requested pharmacy  

## 2015-12-23 ENCOUNTER — Telehealth: Payer: Self-pay | Admitting: *Deleted

## 2015-12-23 NOTE — Telephone Encounter (Signed)
Received BP readings from patient.   MD reviewed and states that BP is acceptable.   No medication changes to be made at this time.   Call placed to patient and patient made aware.

## 2015-12-29 ENCOUNTER — Telehealth: Payer: Self-pay | Admitting: Family Medicine

## 2015-12-29 NOTE — Telephone Encounter (Signed)
Patient calling to say that [facility] does not have atenolol 50 mg until November, would like to know if metopolol tartrate 50 mg or metoprolol succinate 50 mg can be substituted for this instead  [phone]

## 2015-12-30 MED ORDER — METOPROLOL SUCCINATE ER 50 MG PO TB24: 50.0000 mg | ORAL_TABLET | Freq: Every day | ORAL | 3 refills | Status: DC

## 2015-12-30 NOTE — Telephone Encounter (Signed)
Switch to toprol xl 50 mg poqday and dc atenolol

## 2015-12-30 NOTE — Telephone Encounter (Signed)
Medication called/sent to requested pharmacy and pt aware 

## 2016-01-18 ENCOUNTER — Ambulatory Visit (INDEPENDENT_AMBULATORY_CARE_PROVIDER_SITE_OTHER): Payer: Commercial Managed Care - HMO

## 2016-01-18 DIAGNOSIS — Z23 Encounter for immunization: Secondary | ICD-10-CM | POA: Diagnosis not present

## 2016-02-11 ENCOUNTER — Encounter: Payer: Self-pay | Admitting: Family Medicine

## 2016-02-11 ENCOUNTER — Ambulatory Visit (INDEPENDENT_AMBULATORY_CARE_PROVIDER_SITE_OTHER): Payer: Commercial Managed Care - HMO | Admitting: Family Medicine

## 2016-02-11 VITALS — BP 160/80 | HR 86 | Temp 98.7°F | Resp 18 | Ht 64.0 in | Wt 261.0 lb

## 2016-02-11 DIAGNOSIS — R1030 Lower abdominal pain, unspecified: Secondary | ICD-10-CM | POA: Diagnosis not present

## 2016-02-11 LAB — COMPLETE METABOLIC PANEL WITH GFR
ALBUMIN: 4.5 g/dL (ref 3.6–5.1)
ALK PHOS: 45 U/L (ref 33–130)
ALT: 20 U/L (ref 6–29)
AST: 22 U/L (ref 10–35)
BILIRUBIN TOTAL: 0.4 mg/dL (ref 0.2–1.2)
BUN: 19 mg/dL (ref 7–25)
CALCIUM: 10 mg/dL (ref 8.6–10.4)
CO2: 27 mmol/L (ref 20–31)
Chloride: 104 mmol/L (ref 98–110)
Creat: 0.81 mg/dL (ref 0.60–0.93)
GFR, EST AFRICAN AMERICAN: 83 mL/min (ref >=60)
GFR, EST NON AFRICAN AMERICAN: 72 mL/min (ref >=60)
Glucose, Bld: 109 mg/dL — ABNORMAL HIGH (ref 70–99)
Potassium: 4 mmol/L (ref 3.5–5.3)
Sodium: 140 mmol/L (ref 135–146)
TOTAL PROTEIN: 7.7 g/dL (ref 6.1–8.1)

## 2016-02-11 LAB — CBC WITH DIFFERENTIAL/PLATELET
BASOS ABS: 0 {cells}/uL (ref 0–200)
Basophils Relative: 0 %
EOS ABS: 152 {cells}/uL (ref 15–500)
Eosinophils Relative: 2 %
HCT: 38.4 % (ref 35.0–45.0)
Hemoglobin: 13.2 g/dL (ref 12.0–15.0)
Lymphocytes Relative: 46 %
Lymphs Abs: 3496 cells/uL (ref 850–3900)
MCH: 26.7 pg — AB (ref 27.0–33.0)
MCHC: 34.4 g/dL (ref 32.0–36.0)
MCV: 77.7 fL — AB (ref 80.0–100.0)
MONOS PCT: 7 %
MPV: 9.8 fL (ref 7.5–12.5)
Monocytes Absolute: 532 cells/uL (ref 200–950)
NEUTROS ABS: 3420 {cells}/uL (ref 1500–7800)
Neutrophils Relative %: 45 %
PLATELETS: 283 10*3/uL (ref 140–400)
RBC: 4.94 MIL/uL (ref 3.80–5.10)
RDW: 14.9 % (ref 11.0–15.0)
WBC: 7.6 10*3/uL (ref 3.8–10.8)

## 2016-02-11 LAB — LIPASE: LIPASE: 85 U/L — AB (ref 7–60)

## 2016-02-11 NOTE — Progress Notes (Signed)
Subjective:    Patient ID: [name], female    DOB: 12/04/1940, 75 y.o.   MRN: [id number]  Medication Refill   Diabetes   1/17 She is here today for follow-up.  Her blood pressure today is acceptable at 140/70. She denies any chest pain shortness of breath or dyspnea on exertion. She is due to recheck her cholesterol. She is also due to recheck her blood sugar. She was found to have hyperglycemia with a fasting blood sugar of 108.   She has prediabetes along with hyperlipidemia. She denies any myalgias or right upper quadrant pain. She's been taking coenzyme Q 10 which seems to help with the myalgias.  At that time, my plan was: blood pressure is acceptable. I will check a fasting lipid panel. Goal LDL cholesterol is less than 130. I will also check a hemoglobin A1c.  Her flu shot is up-to-date. Pneumovax 23 and Prevnar 13 are up-to-date. Her colonoscopy and her mammogram are up-to-date. Recheck in 6 months  12/13/15 Patient is here today for follow-up. She does have one concern. She has noticed 2 episodes of black tarry stool. However she is also taking iron pills due to anemia. The changes in her stools seem to coincide with when she took the iron pills. She denies any bright red blood per rectum. She denies any chest pain shortness of breath dyspnea on exertion. She denies any epigastric abdominal pain. Her blood pressure today is extremely high. I checked this and verified it myself. However the patient is extremely anxious because of the black tarry stools. She states that at home her blood pressures have been WELL controlled between A999333 systolic.  AT that time, my plan was: believe the black tarry stools or due to the iron supplements she states taking. I recommended that she discontinue the iron supplements for 1 week and then check fecal occult blood cards 3. If at that time, but a positive for blood, I recommend GI consultation for EGD and possibly a colonoscopy. Her blood  pressure today is extremely high but she is also extremely anxious. She will check her blood pressure everyday for the next 2-3 days and notify me on Friday with the values are. If at that time blood pressure readings are still greater than 140/90, I would increase atenolol.  While the patient is here today, I will check a CMP, fasting lipid panel, and hemoglobin A1c pertaining to her prediabetes.  02/11/16 Stool samples were negative for blood. Starting approximately 2-3 weeks ago, patient started experiencing constipation. She is only having 2 bowel movements a week and they're not normal. She is very constipated. She is also now getting spasms of pain in her lower abdomen and in her right upper quadrant. Sometimes the pain radiates to her tailbone. The pain comes and goes without provocation. She also reports bloating and nausea and decreased appetite. She denies any fevers or chills. She denies any nausea vomiting or diarrhea. She denies any blood in her stools. She denies any dysuria or hematuria.   Past Medical History:  Diagnosis Date  . Arthritis    rheumatoid arthritis  . Brain tumor ([place])   . Chest pain   . Colon polyps   . Dyspepsia   . Gallbladder sludge   . Hypercholesterolemia   . Hypertension   . Left atrial dilatation    Mild  . Mild aortic sclerosis (HCC)    without stenosis  . Mitral annular calcification    Mild  . Mitral  valve disorder    Mildly thickened mitral valve, but with normal leaflet but with normal leaflet   . 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 lt eye implant  1998  . rt knee surg  1991  . TUBAL LIGATION  1981   Current Outpatient Prescriptions on File Prior to Visit  Medication Sig Dispense Refill  . amLODipine (NORVASC) 10 MG tablet Take 1 tablet (10 mg total) by mouth daily. 90 tablet 3  . Ascorbic Acid (VITAMIN C) 1000 MG tablet Take 1,000 mg by mouth  daily.      Marland Kitchen aspirin 81 MG chewable tablet Chew 81 mg by mouth daily.      Marland Kitchen atorvastatin (LIPITOR) 80 MG tablet Take 0.5 tablets (40 mg total) by mouth daily. 90 tablet 3  . Cholecalciferol (VITAMIN D) 2000 UNITS tablet Take 1,000 Units by mouth daily.     . Coenzyme Q10 (CO Q-10) 100 MG CAPS Take by mouth.    . fenofibrate 54 MG tablet Take 1 tablet (54 mg total) by mouth daily. 90 tablet 3  . ferrous sulfate 325 (65 FE) MG EC tablet Take 325 mg by mouth 3 (three) times daily with meals.    Marland Kitchen glucose blood (ACCU-CHEK AVIVA PLUS) test strip CHECK DAILY 100 each 3  . Lancet Devices (ACCU-CHEK SOFTCLIX) lancets Check BS qd - DX- E11.9 1 each 5  . losartan-hydrochlorothiazide (HYZAAR) 100-25 MG tablet Take 1 tablet by mouth daily. 90 tablet 3  . metoprolol succinate (TOPROL-XL) 50 MG 24 hr tablet Take 1 tablet (50 mg total) by mouth daily. Take with or immediately following a meal. 90 tablet 3  . Omega-3 Fatty Acids (FISH OIL CONCENTRATE PO) Take by mouth.    Vladimir Faster Glycol-Propyl Glycol (SYSTANE) 0.4-0.3 % SOLN Apply to eye.    . pyridOXINE (VITAMIN B-6) 100 MG tablet Take 100 mg by mouth daily as needed.      No current facility-administered medications on file prior to visit.    Allergies  Allergen Reactions  . Vytorin [Ezetimibe-Simvastatin] Other (See Comments)    myalgia   Social History   Social History  . Marital status: Widowed    Spouse name: N/A  . Number of children: N/A  . Years of education: N/A   Occupational History  . Not on file.   Social History Main Topics  . Smoking status: Former Smoker    Quit date: 09/07/1973  . Smokeless tobacco: Never Used  . Alcohol use No  . Drug use: No  . Sexual activity: Not on file   Other Topics Concern  . Not on file   Social History Narrative  . No narrative on file   Family History  Problem Relation Age of Onset  . Stroke Paternal Grandmother   . Diabetes Mother   . Diabetes Sister   . Diabetes Grandchild       Review of Systems  All other systems reviewed and are negative.      Objective:   Physical Exam  Constitutional: She is oriented to person, place, and time. She appears well-developed and well-nourished. No distress.  HENT:  Head: Normocephalic and atraumatic.  Right Ear: External ear normal.  Left Ear: External ear normal.  Nose: Nose normal.  Mouth/Throat: Oropharynx is clear and moist. No oropharyngeal exudate.  Eyes: Conjunctivae and EOM are normal. Pupils are equal, round, and  reactive to light. Right eye exhibits no discharge. Left eye exhibits no discharge. No scleral icterus.  Neck: Normal range of motion. Neck supple. No JVD present. No tracheal deviation present. No thyromegaly present.  Cardiovascular: Normal rate, regular rhythm, normal heart sounds and intact distal pulses.  Exam reveals no gallop and no friction rub.   No murmur heard. Pulmonary/Chest: Effort normal and breath sounds normal. No stridor. No respiratory distress. She has no wheezes. She has no rales. She exhibits no tenderness.  Abdominal: Soft. Bowel sounds are normal. She exhibits no distension and no mass. There is no tenderness. There is no rebound and no guarding.  Musculoskeletal: Normal range of motion. She exhibits no edema or tenderness.  Lymphadenopathy:    She has no cervical adenopathy.  Neurological: She is alert and oriented to person, place, and time. She has normal reflexes. No cranial nerve deficit. She exhibits normal muscle tone. Coordination normal.  Skin: Skin is warm. No rash noted. She is not diaphoretic. No erythema. No pallor.  Psychiatric: She has a normal mood and affect. Her behavior is normal. Judgment and thought content normal.  Vitals reviewed.         Assessment & Plan:  Lower abdominal pain - Plan: CBC with Differential/Platelet, COMPLETE METABOLIC PANEL WITH GFR, Lipase  I suspect that the abdominal pain is due to constipation. Begin linzess 145 mcg poqday and  recheck next week.  Also check CBC CMP and lipase. If pain improves and lab work is normal, no further workup is necessary. If pain persisted even after normal bowel movements, I would proceed with imaging of the abdomen

## 2016-02-21 ENCOUNTER — Other Ambulatory Visit: Payer: Self-pay | Admitting: Family Medicine

## 2016-02-21 DIAGNOSIS — Z1231 Encounter for screening mammogram for malignant neoplasm of breast: Secondary | ICD-10-CM

## 2016-02-22 ENCOUNTER — Encounter: Payer: Self-pay | Admitting: Family Medicine

## 2016-02-22 ENCOUNTER — Ambulatory Visit (INDEPENDENT_AMBULATORY_CARE_PROVIDER_SITE_OTHER): Payer: Commercial Managed Care - HMO | Admitting: Family Medicine

## 2016-02-22 VITALS — BP 162/88 | HR 78 | Temp 98.3°F | Resp 20 | Ht 64.0 in | Wt 262.0 lb

## 2016-02-22 DIAGNOSIS — R1010 Upper abdominal pain, unspecified: Secondary | ICD-10-CM

## 2016-02-22 DIAGNOSIS — R748 Abnormal levels of other serum enzymes: Secondary | ICD-10-CM

## 2016-02-22 NOTE — Progress Notes (Signed)
Subjective:    Patient ID: [name], female    DOB: 28-Apr-1941, 75 y.o.   MRN: [id number]  Medication Refill  Associated symptoms include abdominal pain.  Diabetes   Abdominal Pain   1/17 She is here today for follow-up.  Her blood pressure today is acceptable at 140/70. She denies any chest pain shortness of breath or dyspnea on exertion. She is due to recheck her cholesterol. She is also due to recheck her blood sugar. She was found to have hyperglycemia with a fasting blood sugar of 108.   She has prediabetes along with hyperlipidemia. She denies any myalgias or right upper quadrant pain. She's been taking coenzyme Q 10 which seems to help with the myalgias.  At that time, my plan was: blood pressure is acceptable. I will check a fasting lipid panel. Goal LDL cholesterol is less than 130. I will also check a hemoglobin A1c.  Her flu shot is up-to-date. Pneumovax 23 and Prevnar 13 are up-to-date. Her colonoscopy and her mammogram are up-to-date. Recheck in 6 months  12/13/15 Patient is here today for follow-up. She does have one concern. She has noticed 2 episodes of black tarry stool. However she is also taking iron pills due to anemia. The changes in her stools seem to coincide with when she took the iron pills. She denies any bright red blood per rectum. She denies any chest pain shortness of breath dyspnea on exertion. She denies any epigastric abdominal pain. Her blood pressure today is extremely high. I checked this and verified it myself. However the patient is extremely anxious because of the black tarry stools. She states that at home her blood pressures have been WELL controlled between [id number] systolic.  AT that time, my plan was: believe the black tarry stools or due to the iron supplements she states taking. I recommended that she discontinue the iron supplements for 1 week and then check fecal occult blood cards 3. If at that time, but a positive for blood, I recommend GI  consultation for EGD and possibly a colonoscopy. Her blood pressure today is extremely high but she is also extremely anxious. She will check her blood pressure everyday for the next 2-3 days and notify me on Friday with the values are. If at that time blood pressure readings are still greater than 140/90, I would increase atenolol.  While the patient is here today, I will check a CMP, fasting lipid panel, and hemoglobin A1c pertaining to her prediabetes.  02/11/16 Stool samples were negative for blood. Starting approximately 2-3 weeks ago, patient started experiencing constipation. She is only having 2 bowel movements a week and they're not normal. She is very constipated. She is also now getting spasms of pain in her lower abdomen and in her right upper quadrant. Sometimes the pain radiates to her tailbone. The pain comes and goes without provocation. She also reports bloating and nausea and decreased appetite. She denies any fevers or chills. She denies any nausea vomiting or diarrhea. She denies any blood in her stools. She denies any dysuria or hematuria.  AT that time, my plan was: I suspect that the abdominal pain is due to constipation. Begin linzess 145 mcg poqday and recheck next week.  Also check CBC CMP and lipase. If pain improves and lab work is normal, no further workup is necessary. If pain persisted even after normal bowel movements, I would proceed with imaging of the abdomen   02/22/16 Lab work was significant for a mildly  elevated lipase. The medication did help the patient go to the bathroom. However her pain has worsened. Now is located in the upper abdomen. It is worse with twisting motions, flexion, or. Occasionally she'll have the pain hit her in the abdomen for no reason. She does have black stools but she is on iron. She denies any diarrhea. The constipation is better. She denies any nausea or vomiting. She does complain of worsening reflux   Past Medical History:  Diagnosis Date   . Arthritis    rheumatoid arthritis  . Brain tumor ([place])   . Chest pain   . Colon polyps   . Dyspepsia   . Gallbladder sludge   . Hypercholesterolemia   . Hypertension   . Left atrial dilatation    Mild  . Mild aortic sclerosis (HCC)    without stenosis  . Mitral annular calcification    Mild  . Mitral valve disorder    Mildly thickened mitral valve, but with normal leaflet but with normal leaflet   . 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 lt eye implant  1998  . rt knee surg  1991  . TUBAL LIGATION  1981   Current Outpatient Prescriptions on File Prior to Visit  Medication Sig Dispense Refill  . amLODipine (NORVASC) 10 MG tablet Take 1 tablet (10 mg total) by mouth daily. 90 tablet 3  . Ascorbic Acid (VITAMIN C) 1000 MG tablet Take 1,000 mg by mouth daily.      Marland Kitchen aspirin 81 MG chewable tablet Chew 81 mg by mouth daily.      Marland Kitchen atorvastatin (LIPITOR) 80 MG tablet Take 0.5 tablets (40 mg total) by mouth daily. 90 tablet 3  . Cholecalciferol (VITAMIN D) 2000 UNITS tablet Take 1,000 Units by mouth daily.     . Coenzyme Q10 (CO Q-10) 100 MG CAPS Take by mouth.    . fenofibrate 54 MG tablet Take 1 tablet (54 mg total) by mouth daily. 90 tablet 3  . ferrous sulfate 325 (65 FE) MG EC tablet Take 325 mg by mouth 3 (three) times daily with meals.    Marland Kitchen glucose blood (ACCU-CHEK AVIVA PLUS) test strip CHECK DAILY 100 each 3  . Lancet Devices (ACCU-CHEK SOFTCLIX) lancets Check BS qd - DX- E11.9 1 each 5  . losartan-hydrochlorothiazide (HYZAAR) 100-25 MG tablet Take 1 tablet by mouth daily. 90 tablet 3  . metoprolol succinate (TOPROL-XL) 50 MG 24 hr tablet Take 1 tablet (50 mg total) by mouth daily. Take with or immediately following a meal. 90 tablet 3  . Omega-3 Fatty Acids (FISH OIL CONCENTRATE PO) Take by mouth.    Vladimir Faster Glycol-Propyl Glycol (SYSTANE) 0.4-0.3 % SOLN Apply to eye.    .  pyridOXINE (VITAMIN B-6) 100 MG tablet Take 100 mg by mouth daily as needed.      No current facility-administered medications on file prior to visit.    Allergies  Allergen Reactions  . Vytorin [Ezetimibe-Simvastatin] Other (See Comments)    myalgia   Social History   Social History  . Marital status: Widowed    Spouse name: N/A  . Number of children: N/A  . Years of education: N/A   Occupational History  . Not on file.   Social History Main Topics  . Smoking status: Former Smoker    Quit date: 09/07/1973  . Smokeless tobacco: Never Used  .  Alcohol use No  . Drug use: No  . Sexual activity: Not on file   Other Topics Concern  . Not on file   Social History Narrative  . No narrative on file   Family History  Problem Relation Age of Onset  . Stroke Paternal Grandmother   . Diabetes Mother   . Diabetes Sister   . Diabetes Grandchild      Review of Systems  Gastrointestinal: Positive for abdominal pain.  All other systems reviewed and are negative.      Objective:   Physical Exam  Constitutional: She is oriented to person, place, and time. She appears well-developed and well-nourished. No distress.  HENT:  Head: Normocephalic and atraumatic.  Right Ear: External ear normal.  Left Ear: External ear normal.  Nose: Nose normal.  Mouth/Throat: Oropharynx is clear and moist. No oropharyngeal exudate.  Eyes: Conjunctivae and EOM are normal. Pupils are equal, round, and reactive to light. Right eye exhibits no discharge. Left eye exhibits no discharge. No scleral icterus.  Neck: Normal range of motion. Neck supple. No JVD present. No tracheal deviation present. No thyromegaly present.  Cardiovascular: Normal rate, regular rhythm, normal heart sounds and intact distal pulses.  Exam reveals no gallop and no friction rub.   No murmur heard. Pulmonary/Chest: Effort normal and breath sounds normal. No stridor. No respiratory distress. She has no wheezes. She has no  rales. She exhibits no tenderness.  Abdominal: Soft. Bowel sounds are normal. She exhibits no distension and no mass. There is no tenderness. There is no rebound and no guarding.  Musculoskeletal: Normal range of motion. She exhibits no edema or tenderness.  Lymphadenopathy:    She has no cervical adenopathy.  Neurological: She is alert and oriented to person, place, and time. She has normal reflexes. No cranial nerve deficit. She exhibits normal muscle tone. Coordination normal.  Skin: Skin is warm. No rash noted. She is not diaphoretic. No erythema. No pallor.  Psychiatric: She has a normal mood and affect. Her behavior is normal. Judgment and thought content normal.  Vitals reviewed.         Assessment & Plan:  Pain of upper abdomen - Plan: CT Abdomen Pelvis W Contrast  Elevated lipase - Plan: CT Abdomen Pelvis W Contrast  Given the elevated lipase in the worsening abdominal pain, I'll proceed with a CT scan of the abdomen and pelvis. Patient can switch to MiraLAX daily for constipation. I will give the patient some samples of dexilant 60 mg a day with food to see if this will help with some of the burning epigastric pain that she reports. Await the results of the CT scan

## 2016-03-02 ENCOUNTER — Other Ambulatory Visit: Payer: Self-pay

## 2016-03-08 ENCOUNTER — Ambulatory Visit (HOSPITAL_COMMUNITY)
Admission: RE | Admit: 2016-03-08 | Discharge: 2016-03-08 | Disposition: A | Discharge status: Home / Self Care | Payer: Commercial Managed Care - HMO | Source: Ambulatory Visit | Attending: Family Medicine | Admitting: Family Medicine

## 2016-03-08 DIAGNOSIS — R1010 Upper abdominal pain, unspecified: Secondary | ICD-10-CM | POA: Insufficient documentation

## 2016-03-08 DIAGNOSIS — R748 Abnormal levels of other serum enzymes: Secondary | ICD-10-CM | POA: Insufficient documentation

## 2016-03-08 DIAGNOSIS — K449 Diaphragmatic hernia without obstruction or gangrene: Secondary | ICD-10-CM | POA: Insufficient documentation

## 2016-03-08 DIAGNOSIS — N281 Cyst of kidney, acquired: Secondary | ICD-10-CM | POA: Insufficient documentation

## 2016-03-08 MED ORDER — IOPAMIDOL (ISOVUE-300) INJECTION 61%
100.0000 mL | Freq: Once | INTRAVENOUS | Status: AC | PRN
  Administered 2016-03-08: 100 mL via INTRAVENOUS

## 2016-03-23 ENCOUNTER — Ambulatory Visit (HOSPITAL_COMMUNITY)
Admission: RE | Admit: 2016-03-23 | Discharge: 2016-03-23 | Disposition: A | Discharge status: Home / Self Care | Payer: Commercial Managed Care - HMO | Source: Ambulatory Visit | Attending: Family Medicine | Admitting: Family Medicine

## 2016-03-23 DIAGNOSIS — Z1231 Encounter for screening mammogram for malignant neoplasm of breast: Secondary | ICD-10-CM | POA: Diagnosis not present

## 2016-06-14 ENCOUNTER — Ambulatory Visit (INDEPENDENT_AMBULATORY_CARE_PROVIDER_SITE_OTHER): Payer: Self-pay | Admitting: Family Medicine

## 2016-06-14 ENCOUNTER — Encounter: Payer: Self-pay | Admitting: Family Medicine

## 2016-06-14 VITALS — BP 148/78 | HR 68 | Temp 98.5°F | Resp 18 | Ht 64.0 in | Wt 260.0 lb

## 2016-06-14 DIAGNOSIS — E7800 Pure hypercholesterolemia, unspecified: Secondary | ICD-10-CM | POA: Diagnosis not present

## 2016-06-14 DIAGNOSIS — I10 Essential (primary) hypertension: Secondary | ICD-10-CM | POA: Diagnosis not present

## 2016-06-14 LAB — COMPLETE METABOLIC PANEL WITH GFR
ALBUMIN: 4.3 g/dL (ref 3.6–5.1)
ALK PHOS: 47 U/L (ref 33–130)
ALT: 26 U/L (ref 6–29)
AST: 27 U/L (ref 10–35)
BILIRUBIN TOTAL: 0.5 mg/dL (ref 0.2–1.2)
BUN: 23 mg/dL (ref 7–25)
CO2: 27 mmol/L (ref 20–31)
CREATININE: 0.92 mg/dL (ref 0.60–0.93)
Calcium: 10.5 mg/dL — ABNORMAL HIGH (ref 8.6–10.4)
Chloride: 103 mmol/L (ref 98–110)
GFR, EST AFRICAN AMERICAN: 71 mL/min (ref >=60)
GFR, Est Non African American: 62 mL/min (ref >=60)
GLUCOSE: 115 mg/dL — AB (ref 70–99)
Potassium: 3.7 mmol/L (ref 3.5–5.3)
SODIUM: 143 mmol/L (ref 135–146)
TOTAL PROTEIN: 7.9 g/dL (ref 6.1–8.1)

## 2016-06-14 LAB — LIPID PANEL
Cholesterol: 185 mg/dL (ref <200)
HDL: 63 mg/dL (ref >50)
LDL Cholesterol: 107 mg/dL — ABNORMAL HIGH (ref <100)
Total CHOL/HDL Ratio: 2.9 Ratio (ref <5.0)
Triglycerides: 75 mg/dL (ref <150)
VLDL: 15 mg/dL (ref <30)

## 2016-06-14 NOTE — Progress Notes (Signed)
Subjective:    Patient ID: [name], female    DOB: 16-Apr-1941, 76 y.o.   MRN: [id number]  HPI Patient is here today for a checkup. Her blood pressure today is elevated. However the patient checks it on a daily basis at home and she seldom sees the systolic blood pressure greater than 140. In fact most days is between 116 and 130. She states that her diastolic blood pressure is always well controlled. She denies any chest pain shortness of breath or dyspnea on exertion. She has a history of prediabetes. She is going to be starting yoga classes soon in trying to exercise more to lose weight. She also has a history of hyperlipidemia. She denies any myalgias on the combination of atorvastatin and fenofibrate. She is due to recheck a fasting lipid panel. Otherwise she is doing well with no concerns Past Medical History:  Diagnosis Date  . Arthritis    rheumatoid arthritis  . Brain tumor ([place])   . Chest pain   . Colon polyps   . Dyspepsia   . Gallbladder sludge   . Hypercholesterolemia   . Hypertension   . Left atrial dilatation    Mild  . Mild aortic sclerosis (HCC)    without stenosis  . Mitral annular calcification    Mild  . Mitral valve disorder    Mildly thickened mitral valve, but with normal leaflet but with normal leaflet   . 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 lt eye implant  1998  . rt knee surg  1991  . TUBAL LIGATION  1981   Current Outpatient Prescriptions on File Prior to Visit  Medication Sig Dispense Refill  . amLODipine (NORVASC) 10 MG tablet Take 1 tablet (10 mg total) by mouth daily. 90 tablet 3  . Ascorbic Acid (VITAMIN C) 1000 MG tablet Take 1,000 mg by mouth daily.      Marland Kitchen aspirin 81 MG chewable tablet Chew 81 mg by mouth daily.      Marland Kitchen atorvastatin (LIPITOR) 80 MG tablet Take 0.5 tablets (40 mg total) by mouth daily. 90 tablet 3  . Cholecalciferol (VITAMIN  D) 2000 UNITS tablet Take 1,000 Units by mouth daily.     . Coenzyme Q10 (CO Q-10) 100 MG CAPS Take by mouth.    . fenofibrate 54 MG tablet Take 1 tablet (54 mg total) by mouth daily. 90 tablet 3  . ferrous sulfate 325 (65 FE) MG EC tablet Take 325 mg by mouth 3 (three) times daily with meals.    Marland Kitchen glucose blood (ACCU-CHEK AVIVA PLUS) test strip CHECK DAILY 100 each 3  . Lancet Devices (ACCU-CHEK SOFTCLIX) lancets Check BS qd - DX- E11.9 1 each 5  . losartan-hydrochlorothiazide (HYZAAR) 100-25 MG tablet Take 1 tablet by mouth daily. 90 tablet 3  . metoprolol succinate (TOPROL-XL) 50 MG 24 hr tablet Take 1 tablet (50 mg total) by mouth daily. Take with or immediately following a meal. 90 tablet 3  . Omega-3 Fatty Acids (FISH OIL CONCENTRATE PO) Take by mouth.    Vladimir Faster Glycol-Propyl Glycol (SYSTANE) 0.4-0.3 % SOLN Apply to eye.    . pyridOXINE (VITAMIN B-6) 100 MG tablet Take 100 mg by mouth daily as needed.      No current facility-administered medications on file prior to visit.    Allergies  Allergen Reactions  . Vytorin [Ezetimibe-Simvastatin]  Other (See Comments)    myalgia   Social History   Social History  . Marital status: Widowed    Spouse name: N/A  . Number of children: N/A  . Years of education: N/A   Occupational History  . Not on file.   Social History Main Topics  . Smoking status: Former Smoker    Quit date: 09/07/1973  . Smokeless tobacco: Never Used  . Alcohol use No  . Drug use: No  . Sexual activity: Not on file   Other Topics Concern  . Not on file   Social History Narrative  . No narrative on file      Review of Systems  All other systems reviewed and are negative.      Objective:   Physical Exam  Constitutional: She appears well-developed and well-nourished. No distress.  HENT:  Head: Normocephalic and atraumatic.  Neck: Neck supple. No JVD present. No thyromegaly present.  Cardiovascular: Normal rate, regular rhythm, normal heart  sounds and intact distal pulses.   No murmur heard. Pulmonary/Chest: Effort normal and breath sounds normal. No respiratory distress. She has no wheezes. She has no rales.  Abdominal: Soft. Bowel sounds are normal. She exhibits no distension. There is no tenderness. There is no rebound.  Lymphadenopathy:    She has no cervical adenopathy.  Skin: She is not diaphoretic.  Vitals reviewed.         Assessment & Plan:  Essential hypertension - Plan: COMPLETE METABOLIC PANEL WITH GFR, Lipid panel  Pure hypercholesterolemia - Plan: COMPLETE METABOLIC PANEL WITH GFR, Lipid panel  Blood pressure today here is elevated but her home blood pressures are well controlled. I'll bring no changes in her medication at this time. I will check a fasting lipid panel. Her goal LDL cholesterol is less than 100. I would like to see her triglycerides below 200. I will also check a fasting blood sugar. I have recommended diet exercise and weight loss. She continues to report some bloating. She is tried Gas-X, probiotics, and prune juice. I recommended trying MiraLAX on a daily basis to try to help prevent constipation which could be contributing to her bloating. We did perform a CT scan of her abdomen last fall and was completely normal.

## 2016-06-15 ENCOUNTER — Ambulatory Visit: Payer: Self-pay | Admitting: Family Medicine

## 2016-06-16 ENCOUNTER — Encounter: Payer: Self-pay | Admitting: Family Medicine

## 2016-10-10 ENCOUNTER — Telehealth: Payer: Self-pay | Admitting: Family Medicine

## 2016-10-10 MED ORDER — ATORVASTATIN CALCIUM 80 MG PO TABS: 40.0000 mg | ORAL_TABLET | Freq: Every day | ORAL | 3 refills | Status: DC

## 2016-10-10 MED ORDER — AMLODIPINE BESYLATE 10 MG PO TABS: 10.0000 mg | ORAL_TABLET | Freq: Every day | ORAL | 3 refills | Status: DC

## 2016-10-10 MED ORDER — LOSARTAN POTASSIUM-HCTZ 100-25 MG PO TABS: 1.0000 | ORAL_TABLET | Freq: Every day | ORAL | 3 refills | Status: DC

## 2016-10-10 MED ORDER — METOPROLOL SUCCINATE ER 50 MG PO TB24: 50.0000 mg | ORAL_TABLET | Freq: Every day | ORAL | 3 refills | Status: DC

## 2016-10-10 MED ORDER — FENOFIBRATE 54 MG PO TABS: 54.0000 mg | ORAL_TABLET | Freq: Every day | ORAL | 3 refills | Status: DC

## 2016-10-10 NOTE — Telephone Encounter (Signed)
Pt needs refill on metoprolol,fenofibrate, atorvastiatin, losartan and amlodipine to [facility] rx

## 2016-10-10 NOTE — Telephone Encounter (Signed)
Medication called/sent to requested pharmacy  

## 2016-12-12 ENCOUNTER — Encounter: Payer: Self-pay | Admitting: Family Medicine

## 2016-12-12 ENCOUNTER — Ambulatory Visit (INDEPENDENT_AMBULATORY_CARE_PROVIDER_SITE_OTHER): Payer: Medicare HMO | Admitting: Family Medicine

## 2016-12-12 VITALS — BP 158/76 | HR 82 | Temp 98.1°F | Resp 20 | Ht 64.0 in | Wt 268.0 lb

## 2016-12-12 DIAGNOSIS — I10 Essential (primary) hypertension: Secondary | ICD-10-CM

## 2016-12-12 DIAGNOSIS — E7800 Pure hypercholesterolemia, unspecified: Secondary | ICD-10-CM | POA: Diagnosis not present

## 2016-12-12 LAB — CBC WITH DIFFERENTIAL/PLATELET
BASOS ABS: 0 {cells}/uL (ref 0–200)
BASOS PCT: 0 %
EOS ABS: 216 {cells}/uL (ref 15–500)
Eosinophils Relative: 3 %
HEMATOCRIT: 37.3 % (ref 35.0–45.0)
Hemoglobin: 12.6 g/dL (ref 12.0–15.0)
LYMPHS ABS: 3240 {cells}/uL (ref 850–3900)
Lymphocytes Relative: 45 %
MCH: 27 pg (ref 27.0–33.0)
MCHC: 33.8 g/dL (ref 32.0–36.0)
MCV: 79.9 fL — AB (ref 80.0–100.0)
MONO ABS: 648 {cells}/uL (ref 200–950)
MONOS PCT: 9 %
MPV: 9.5 fL (ref 7.5–12.5)
NEUTROS ABS: 3096 {cells}/uL (ref 1500–7800)
NEUTROS PCT: 43 %
PLATELETS: 291 10*3/uL (ref 140–400)
RBC: 4.67 MIL/uL (ref 3.80–5.10)
RDW: 16 % — AB (ref 11.0–15.0)
WBC: 7.2 10*3/uL (ref 3.8–10.8)

## 2016-12-12 NOTE — Progress Notes (Signed)
Subjective:    Patient ID: [name], female    DOB: 1941-02-28, 76 y.o.   MRN: [id number]  HPI Patient is here today for a checkup. Her blood pressure today is elevated.  When my nurse checked her blood pressure, her systolic blood pressure was greater than 170. I rechecked her blood pressure and found to be 158/76 after the patient had a moment to relax and calm down. This is still higher than goal.  However she has not taken her medication this morning. She denies any chest pain shortness of breath or dyspnea on exertion.  She also has a history of hyperlipidemia. She denies any myalgias on the combination of atorvastatin and fenofibrate. She is due to recheck a fasting lipid panel. Otherwise she is doing well with no concerns Past Medical History:  Diagnosis Date  . Arthritis    rheumatoid arthritis  . Brain tumor ([place])   . Chest pain   . Colon polyps   . Dyspepsia   . Gallbladder sludge   . Hypercholesterolemia   . Hypertension   . Left atrial dilatation    Mild  . Mild aortic sclerosis (HCC)    without stenosis  . Mitral annular calcification    Mild  . Mitral valve disorder    Mildly thickened mitral valve, but with normal leaflet but with normal leaflet   . 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 lt eye implant  1998  . rt knee surg  1991  . TUBAL LIGATION  1981   Current Outpatient Prescriptions on File Prior to Visit  Medication Sig Dispense Refill  . ACCU-CHEK SOFTCLIX LANCETS lancets     . amLODipine (NORVASC) 10 MG tablet Take 1 tablet (10 mg total) by mouth daily. 90 tablet 3  . Ascorbic Acid (VITAMIN C) 1000 MG tablet Take 1,000 mg by mouth daily.      Marland Kitchen aspirin 81 MG chewable tablet Chew 81 mg by mouth daily.      Marland Kitchen atorvastatin (LIPITOR) 80 MG tablet Take 0.5 tablets (40 mg total) by mouth daily. 90 tablet 3  . Cholecalciferol (VITAMIN D) 2000 UNITS tablet  Take 1,000 Units by mouth daily.     . Coenzyme Q10 (CO Q-10) 100 MG CAPS Take by mouth.    . fenofibrate 54 MG tablet Take 1 tablet (54 mg total) by mouth daily. 90 tablet 3  . ferrous sulfate 325 (65 FE) MG EC tablet Take 325 mg by mouth 3 (three) times daily with meals.    Marland Kitchen glucose blood (ACCU-CHEK AVIVA PLUS) test strip CHECK DAILY 100 each 3  . Lancet Devices (ACCU-CHEK SOFTCLIX) lancets Check BS qd - DX- E11.9 1 each 5  . losartan-hydrochlorothiazide (HYZAAR) 100-25 MG tablet Take 1 tablet by mouth daily. 90 tablet 3  . metoprolol succinate (TOPROL-XL) 50 MG 24 hr tablet Take 1 tablet (50 mg total) by mouth daily. Take with or immediately following a meal. 90 tablet 3  . Omega-3 Fatty Acids (FISH OIL CONCENTRATE PO) Take by mouth.    Vladimir Faster Glycol-Propyl Glycol (SYSTANE) 0.4-0.3 % SOLN Apply to eye.    . Probiotic Product (PROBIOTIC DAILY PO) Take by mouth.    . pyridOXINE (VITAMIN B-6) 100 MG tablet Take 100 mg by mouth daily as needed.      No current facility-administered medications on file prior to visit.  Allergies  Allergen Reactions  . Vytorin [Ezetimibe-Simvastatin] Other (See Comments)    myalgia   Social History   Social History  . Marital status: Widowed    Spouse name: N/A  . Number of children: N/A  . Years of education: N/A   Occupational History  . Not on file.   Social History Main Topics  . Smoking status: Former Smoker    Quit date: 09/07/1973  . Smokeless tobacco: Never Used  . Alcohol use No  . Drug use: No  . Sexual activity: Not on file   Other Topics Concern  . Not on file   Social History Narrative  . No narrative on file      Review of Systems  All other systems reviewed and are negative.      Objective:   Physical Exam  Constitutional: She appears well-developed and well-nourished. No distress.  HENT:  Head: Normocephalic and atraumatic.  Neck: Neck supple. No JVD present. No thyromegaly present.  Cardiovascular:  Normal rate, regular rhythm, normal heart sounds and intact distal pulses.   No murmur heard. Pulmonary/Chest: Effort normal and breath sounds normal. No respiratory distress. She has no wheezes. She has no rales.  Abdominal: Soft. Bowel sounds are normal. She exhibits no distension. There is no tenderness. There is no rebound.  Lymphadenopathy:    She has no cervical adenopathy.  Skin: She is not diaphoretic.  Vitals reviewed.         Assessment & Plan:  Pure hypercholesterolemia - Plan: CBC with Differential/Platelet, COMPLETE METABOLIC PANEL WITH GFR, Lipid panel  Essential hypertension  I am concerned about the patient's blood pressure but she assures me that is lower at home. In fact she states that she is going check her blood pressure everyday and write it down and notify me on Friday of the values. If greater than 140/90, I would start the patient on Cardura in addition to her metoprolol, Hyzaar, and amlodipine she is already taking. I will also check a fasting lipid panel. Goal LDL cholesterol is less than 130. I will also monitor her fasting blood sugar.

## 2016-12-13 LAB — LIPID PANEL
CHOLESTEROL: 181 mg/dL (ref <200)
HDL: 66 mg/dL (ref >50)
LDL Cholesterol: 100 mg/dL — ABNORMAL HIGH (ref <100)
TRIGLYCERIDES: 73 mg/dL (ref <150)
Total CHOL/HDL Ratio: 2.7 Ratio (ref <5.0)
VLDL: 15 mg/dL (ref <30)

## 2016-12-13 LAB — COMPLETE METABOLIC PANEL WITH GFR
ALT: 20 U/L (ref 6–29)
AST: 20 U/L (ref 10–35)
Albumin: 4.2 g/dL (ref 3.6–5.1)
Alkaline Phosphatase: 48 U/L (ref 33–130)
BUN: 17 mg/dL (ref 7–25)
CALCIUM: 9.2 mg/dL (ref 8.6–10.4)
CHLORIDE: 108 mmol/L (ref 98–110)
CO2: 22 mmol/L (ref 20–31)
CREATININE: 0.71 mg/dL (ref 0.60–0.93)
GFR, EST NON AFRICAN AMERICAN: 84 mL/min (ref >=60)
Glucose, Bld: 91 mg/dL (ref 70–99)
Potassium: 4.3 mmol/L (ref 3.5–5.3)
Sodium: 142 mmol/L (ref 135–146)
Total Bilirubin: 0.4 mg/dL (ref 0.2–1.2)
Total Protein: 7.1 g/dL (ref 6.1–8.1)

## 2016-12-14 ENCOUNTER — Encounter: Payer: Self-pay | Admitting: Family Medicine

## 2016-12-22 DIAGNOSIS — H524 Presbyopia: Secondary | ICD-10-CM | POA: Diagnosis not present

## 2017-02-13 ENCOUNTER — Ambulatory Visit (INDEPENDENT_AMBULATORY_CARE_PROVIDER_SITE_OTHER): Payer: Medicare HMO | Admitting: *Deleted

## 2017-02-13 DIAGNOSIS — Z23 Encounter for immunization: Secondary | ICD-10-CM

## 2017-02-13 NOTE — Progress Notes (Signed)
Patient seen in office for Influenza Vaccination.   Tolerated IM administration well.   Immunization history updated.  

## 2017-02-15 ENCOUNTER — Other Ambulatory Visit: Payer: Self-pay | Admitting: Family Medicine

## 2017-02-15 DIAGNOSIS — Z1231 Encounter for screening mammogram for malignant neoplasm of breast: Secondary | ICD-10-CM

## 2017-03-28 ENCOUNTER — Ambulatory Visit (HOSPITAL_COMMUNITY)
Admission: RE | Admit: 2017-03-28 | Discharge: 2017-03-28 | Disposition: A | Discharge status: Home / Self Care | Payer: Medicare HMO | Source: Ambulatory Visit | Attending: Family Medicine | Admitting: Family Medicine

## 2017-03-28 ENCOUNTER — Encounter (HOSPITAL_COMMUNITY): Payer: Self-pay

## 2017-03-28 DIAGNOSIS — R928 Other abnormal and inconclusive findings on diagnostic imaging of breast: Secondary | ICD-10-CM | POA: Diagnosis not present

## 2017-03-28 DIAGNOSIS — Z1231 Encounter for screening mammogram for malignant neoplasm of breast: Secondary | ICD-10-CM | POA: Diagnosis not present

## 2017-03-29 ENCOUNTER — Other Ambulatory Visit: Payer: Self-pay | Admitting: Family Medicine

## 2017-03-29 DIAGNOSIS — R928 Other abnormal and inconclusive findings on diagnostic imaging of breast: Secondary | ICD-10-CM

## 2017-04-10 ENCOUNTER — Encounter (HOSPITAL_COMMUNITY): Payer: Self-pay

## 2017-04-10 ENCOUNTER — Ambulatory Visit (HOSPITAL_COMMUNITY)
Admission: RE | Admit: 2017-04-10 | Discharge: 2017-04-10 | Disposition: A | Discharge status: Home / Self Care | Payer: Medicare HMO | Source: Ambulatory Visit | Attending: Family Medicine | Admitting: Family Medicine

## 2017-04-10 DIAGNOSIS — R928 Other abnormal and inconclusive findings on diagnostic imaging of breast: Secondary | ICD-10-CM

## 2017-05-10 DIAGNOSIS — K640 First degree hemorrhoids: Secondary | ICD-10-CM | POA: Diagnosis not present

## 2017-05-10 DIAGNOSIS — Z86010 Personal history of colonic polyps: Secondary | ICD-10-CM | POA: Diagnosis not present

## 2017-05-10 DIAGNOSIS — K5730 Diverticulosis of large intestine without perforation or abscess without bleeding: Secondary | ICD-10-CM | POA: Diagnosis not present

## 2017-06-14 ENCOUNTER — Ambulatory Visit (INDEPENDENT_AMBULATORY_CARE_PROVIDER_SITE_OTHER): Payer: Medicare HMO | Admitting: Family Medicine

## 2017-06-14 ENCOUNTER — Encounter: Payer: Self-pay | Admitting: Family Medicine

## 2017-06-14 VITALS — BP 162/80 | HR 70 | Temp 98.1°F | Resp 14 | Ht 64.0 in | Wt 260.0 lb

## 2017-06-14 DIAGNOSIS — E7800 Pure hypercholesterolemia, unspecified: Secondary | ICD-10-CM | POA: Diagnosis not present

## 2017-06-14 DIAGNOSIS — I10 Essential (primary) hypertension: Secondary | ICD-10-CM

## 2017-06-14 DIAGNOSIS — R7303 Prediabetes: Secondary | ICD-10-CM

## 2017-06-14 LAB — LIPID PANEL
CHOLESTEROL: 185 mg/dL (ref <200)
HDL: 72 mg/dL (ref >50)
LDL CHOLESTEROL (CALC): 97 mg/dL
Non-HDL Cholesterol (Calc): 113 mg/dL (calc) (ref <130)
TRIGLYCERIDES: 74 mg/dL (ref <150)
Total CHOL/HDL Ratio: 2.6 (calc) (ref <5.0)

## 2017-06-14 LAB — COMPLETE METABOLIC PANEL WITH GFR
AG Ratio: 1.4 (calc) (ref 1.0–2.5)
ALT: 20 U/L (ref 6–29)
AST: 19 U/L (ref 10–35)
Albumin: 4.2 g/dL (ref 3.6–5.1)
Alkaline phosphatase (APISO): 46 U/L (ref 33–130)
BILIRUBIN TOTAL: 0.4 mg/dL (ref 0.2–1.2)
BUN: 16 mg/dL (ref 7–25)
CHLORIDE: 106 mmol/L (ref 98–110)
CO2: 26 mmol/L (ref 20–32)
Calcium: 9.8 mg/dL (ref 8.6–10.4)
Creat: 0.83 mg/dL (ref 0.60–0.93)
GFR, EST AFRICAN AMERICAN: 80 mL/min/{1.73_m2} (ref >=60)
GFR, Est Non African American: 69 mL/min/{1.73_m2} (ref >=60)
GLUCOSE: 108 mg/dL — AB (ref 65–99)
Globulin: 2.9 g/dL (calc) (ref 1.9–3.7)
Potassium: 3.8 mmol/L (ref 3.5–5.3)
Sodium: 141 mmol/L (ref 135–146)
TOTAL PROTEIN: 7.1 g/dL (ref 6.1–8.1)

## 2017-06-14 LAB — CBC WITH DIFFERENTIAL/PLATELET
BASOS ABS: 37 {cells}/uL (ref 0–200)
Basophils Relative: 0.5 %
EOS ABS: 161 {cells}/uL (ref 15–500)
Eosinophils Relative: 2.2 %
HCT: 38 % (ref 35.0–45.0)
Hemoglobin: 12.7 g/dL (ref 11.7–15.5)
Lymphs Abs: 3256 cells/uL (ref 850–3900)
MCH: 26.5 pg — AB (ref 27.0–33.0)
MCHC: 33.4 g/dL (ref 32.0–36.0)
MCV: 79.2 fL — AB (ref 80.0–100.0)
MONOS PCT: 6.3 %
MPV: 10.5 fL (ref 7.5–12.5)
NEUTROS PCT: 46.4 %
Neutro Abs: 3387 cells/uL (ref 1500–7800)
PLATELETS: 293 10*3/uL (ref 140–400)
RBC: 4.8 10*6/uL (ref 3.80–5.10)
RDW: 14.6 % (ref 11.0–15.0)
TOTAL LYMPHOCYTE: 44.6 %
WBC mixed population: 460 cells/uL (ref 200–950)
WBC: 7.3 10*3/uL (ref 3.8–10.8)

## 2017-06-14 MED ORDER — LISINOPRIL-HYDROCHLOROTHIAZIDE 20-12.5 MG PO TABS: 2.0000 | ORAL_TABLET | Freq: Every day | ORAL | 3 refills | Status: DC

## 2017-06-14 NOTE — Progress Notes (Signed)
Subjective:    Patient ID: [name], female    DOB: October 18, 1940, 77 y.o.   MRN: [id number]  HPI Patient is here today for a checkup. Her blood pressure today is elevated.  However she has not taken her medication this morning. The same thing happened in July 2018.  She denies any chest pain shortness of breath or dyspnea on exertion.  She also has a history of hyperlipidemia. She denies any myalgias on the combination of atorvastatin and fenofibrate. She is due to recheck a fasting lipid panel. Otherwise she is doing well with no concerns Past Medical History:  Diagnosis Date  . Arthritis    rheumatoid arthritis  . Brain tumor ([place])   . Chest pain   . Colon polyps   . Dyspepsia   . Gallbladder sludge   . Hypercholesterolemia   . Hypertension   . Left atrial dilatation    Mild  . Mild aortic sclerosis ([facility])    without stenosis  . Mitral annular calcification    Mild  . Mitral valve disorder    Mildly thickened mitral valve, but with normal leaflet but with normal leaflet   . 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 lt eye implant  1998  . rt knee surg  1991  . TUBAL LIGATION  1981   Current Outpatient Medications on File Prior to Visit  Medication Sig Dispense Refill  . ACCU-CHEK SOFTCLIX LANCETS lancets     . amLODipine (NORVASC) 10 MG tablet Take 1 tablet (10 mg total) by mouth daily. 90 tablet 3  . Ascorbic Acid (VITAMIN C) 1000 MG tablet Take 1,000 mg by mouth daily.      Marland Kitchen aspirin 81 MG chewable tablet Chew 81 mg by mouth daily.      Marland Kitchen atorvastatin (LIPITOR) 80 MG tablet Take 0.5 tablets (40 mg total) by mouth daily. 90 tablet 3  . Cholecalciferol (VITAMIN D) 2000 UNITS tablet Take 1,000 Units by mouth daily.     . Coenzyme Q10 (CO Q-10) 100 MG CAPS Take by mouth.    . fenofibrate 54 MG tablet Take 1 tablet (54 mg total) by mouth daily. 90 tablet 3  . ferrous sulfate 325  (65 FE) MG EC tablet Take 325 mg by mouth 3 (three) times daily with meals.    Marland Kitchen glucose blood (ACCU-CHEK AVIVA PLUS) test strip CHECK DAILY 100 each 3  . Lancet Devices (ACCU-CHEK SOFTCLIX) lancets Check BS qd - DX- E11.9 1 each 5  . metoprolol succinate (TOPROL-XL) 50 MG 24 hr tablet Take 1 tablet (50 mg total) by mouth daily. Take with or immediately following a meal. 90 tablet 3  . naproxen sodium (ALEVE) 220 MG tablet Take 220 mg by mouth 2 (two) times daily with a meal.    . Omega-3 Fatty Acids (FISH OIL CONCENTRATE PO) Take by mouth.    Vladimir Faster Glycol-Propyl Glycol (SYSTANE) 0.4-0.3 % SOLN Apply to eye.    . polyethylene glycol (MIRALAX / GLYCOLAX) packet Take 17 g by mouth daily.    . Probiotic Product (PROBIOTIC DAILY PO) Take by mouth.    . pyridOXINE (VITAMIN B-6) 100 MG tablet Take 100 mg by mouth daily as needed.      No current facility-administered medications on file prior to visit.    Allergies  Allergen Reactions  . Vytorin [Ezetimibe-Simvastatin] Other (See Comments)  myalgia   Social History   Socioeconomic History  . Marital status: Widowed    Spouse name: Not on file  . Number of children: Not on file  . Years of education: Not on file  . Highest education level: Not on file  Social Needs  . Financial resource strain: Not on file  . Food insecurity - worry: Not on file  . Food insecurity - inability: Not on file  . Transportation needs - medical: Not on file  . Transportation needs - non-medical: Not on file  Occupational History  . Not on file  Tobacco Use  . Smoking status: Former Smoker    Last attempt to quit: 09/07/1973    Years since quitting: 43.7  . Smokeless tobacco: Never Used  Substance and Sexual Activity  . Alcohol use: No  . Drug use: No  . Sexual activity: Not on file  Other Topics Concern  . Not on file  Social History Narrative  . Not on file      Review of Systems  All other systems reviewed and are negative.        Objective:   Physical Exam  Constitutional: She appears well-developed and well-nourished. No distress.  HENT:  Head: Normocephalic and atraumatic.  Neck: Neck supple. No JVD present. No thyromegaly present.  Cardiovascular: Normal rate, regular rhythm, normal heart sounds and intact distal pulses.  No murmur heard. Pulmonary/Chest: Effort normal and breath sounds normal. No respiratory distress. She has no wheezes. She has no rales.  Abdominal: Soft. Bowel sounds are normal. She exhibits no distension. There is no tenderness. There is no rebound.  Lymphadenopathy:    She has no cervical adenopathy.  Skin: She is not diaphoretic.  Vitals reviewed.         Assessment & Plan:  Pure hypercholesterolemia - Plan: CBC with Differential/Platelet, COMPLETE METABOLIC PANEL WITH GFR, Lipid panel  Essential hypertension  Prediabetes  I am concerned about the patient's blood pressure but she assures me that is lower at home. In fact she states that she is going check her blood pressure everyday and write it down and bring  It in for me to review next week.   I will also check a fasting lipid panel. Goal LDL cholesterol is less than 130. I will also monitor her fasting blood sugar.  Will add cardura if bp is greater than 140/90.

## 2017-06-15 ENCOUNTER — Encounter: Payer: Self-pay | Admitting: Family Medicine

## 2017-07-31 ENCOUNTER — Other Ambulatory Visit: Payer: Self-pay | Admitting: Family Medicine

## 2017-09-24 ENCOUNTER — Ambulatory Visit (INDEPENDENT_AMBULATORY_CARE_PROVIDER_SITE_OTHER): Payer: Medicare HMO | Admitting: Family Medicine

## 2017-09-24 ENCOUNTER — Encounter: Payer: Self-pay | Admitting: Family Medicine

## 2017-09-24 VITALS — BP 150/80 | HR 64 | Temp 98.1°F | Resp 18 | Ht 64.0 in | Wt 257.0 lb

## 2017-09-24 DIAGNOSIS — R140 Abdominal distension (gaseous): Secondary | ICD-10-CM

## 2017-09-24 DIAGNOSIS — R0609 Other forms of dyspnea: Secondary | ICD-10-CM

## 2017-09-24 NOTE — Progress Notes (Signed)
Subjective:    Patient ID: [name], female    DOB: [name] 22, 1942, 77 y.o.   MRN: [id number]  Patient presents with 2 major complaints today.  First she reports feeling abdominal bloating.  She states that whenever she wakes up in the morning she feels extremely full and distended.  She will frequently have to burp or pass gas.  Symptoms improve when she passes gas or has a bowel movement.  Sometimes she will have 1 or 2 days in between having a bowel movement.  She feels bloated and constipated despite taking MiraLAX as well as Benefiber.  She denies any melena.  She denies any hematochezia.  She denies any nausea vomiting or diarrhea.  She denies any fevers or chills or dysuria.  Second, she reports feeling "short winded".  She states that with minimal activity when she is sedentary she feels fine.  However when she starts to exercise or she gets up and starts to work in the yard, she states that she will become short of breath more quickly than she used to.  She denies any angina.  She denies any chest pain.  She states that it takes a moderate amount of activity to make her feel short of breath.  She denies any cough or pleurisy or hemoptysis.  She denies any orthopnea or paroxysmal nocturnal dyspnea.  She had a normal stress test in 2012.  She has had no further work-up since.  Her blood pressure is elevated today however the patient, as always, is adamant that her blood pressure is better at home. Past Medical History:  Diagnosis Date  . Arthritis    rheumatoid arthritis  . Brain tumor ([place])   . Chest pain   . Colon polyps   . Dyspepsia   . Gallbladder sludge   . Hypercholesterolemia   . Hypertension   . Left atrial dilatation    Mild  . Mild aortic sclerosis ([facility])    without stenosis  . Mitral annular calcification    Mild  . Mitral valve disorder    Mildly thickened mitral valve, but with normal leaflet but with normal leaflet   . 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 lt eye implant  1998  . rt knee surg  1991  . TUBAL LIGATION  1981   Current Outpatient Medications on File Prior to Visit  Medication Sig Dispense Refill  . ACCU-CHEK SOFTCLIX LANCETS lancets     . amLODipine (NORVASC) 10 MG tablet TAKE 1 TABLET EVERY DAY 90 tablet 3  . Ascorbic Acid (VITAMIN C) 1000 MG tablet Take 1,000 mg by mouth daily.      Marland Kitchen aspirin 81 MG chewable tablet Chew 81 mg by mouth daily.      Marland Kitchen atorvastatin (LIPITOR) 80 MG tablet Take 0.5 tablets (40 mg total) by mouth daily. 90 tablet 3  . Cholecalciferol (VITAMIN D) 2000 UNITS tablet Take 1,000 Units by mouth daily.     . Coenzyme Q10 (CO Q-10) 100 MG CAPS Take by mouth.    . fenofibrate 54 MG tablet TAKE 1 TABLET EVERY DAY 90 tablet 3  . ferrous sulfate 325 (65 FE) MG EC tablet Take 325 mg by mouth 3 (three) times daily with meals.    Marland Kitchen glucose blood (ACCU-CHEK AVIVA PLUS) test strip CHECK DAILY 100 each 3  . Lancet Devices (ACCU-CHEK SOFTCLIX) lancets  Check BS qd - DX- E11.9 1 each 5  . lisinopril-hydrochlorothiazide (ZESTORETIC) 20-12.5 MG tablet Take 2 tablets by mouth daily. 180 tablet 3  . metoprolol succinate (TOPROL-XL) 50 MG 24 hr tablet TAKE 1 TABLET EVERY DAY. TAKE WITH OR IMMEDIATELY FOLLOWING A MEAL. 90 tablet 3  . naproxen sodium (ALEVE) 220 MG tablet Take 220 mg by mouth 2 (two) times daily with a meal.    . Omega-3 Fatty Acids (FISH OIL CONCENTRATE PO) Take by mouth.    Vladimir Faster Glycol-Propyl Glycol (SYSTANE) 0.4-0.3 % SOLN Apply to eye.    . polyethylene glycol (MIRALAX / GLYCOLAX) packet Take 17 g by mouth daily.    . Probiotic Product (PROBIOTIC DAILY PO) Take by mouth.    . pyridOXINE (VITAMIN B-6) 100 MG tablet Take 100 mg by mouth daily as needed.      No current facility-administered medications on file prior to visit.    Allergies  Allergen Reactions  . Vytorin [Ezetimibe-Simvastatin] Other (See  Comments)    myalgia   Social History   Socioeconomic History  . Marital status: Widowed    Spouse name: Not on file  . Number of children: Not on file  . Years of education: Not on file  . Highest education level: Not on file  Occupational History  . Not on file  Social Needs  . Financial resource strain: Not on file  . Food insecurity:    Worry: Not on file    Inability: Not on file  . Transportation needs:    Medical: Not on file    Non-medical: Not on file  Tobacco Use  . Smoking status: Former Smoker    Last attempt to quit: 09/07/1973    Years since quitting: 44.0  . Smokeless tobacco: Never Used  Substance and Sexual Activity  . Alcohol use: No  . Drug use: No  . Sexual activity: Not on file  Lifestyle  . Physical activity:    Days per week: Not on file    Minutes per session: Not on file  . Stress: Not on file  Relationships  . Social connections:    Talks on phone: Not on file    Gets together: Not on file    Attends religious service: Not on file    Active member of club or organization: Not on file    Attends meetings of clubs or organizations: Not on file    Relationship status: Not on file  . Intimate partner violence:    Fear of current or ex partner: Not on file    Emotionally abused: Not on file    Physically abused: Not on file    Forced sexual activity: Not on file  Other Topics Concern  . Not on file  Social History Narrative  . Not on file      Review of Systems  All other systems reviewed and are negative.      Objective:   Physical Exam  Constitutional: She appears well-developed and well-nourished. No distress.  HENT:  Head: Normocephalic and atraumatic.  Neck: Neck supple. No JVD present. No thyromegaly present.  Cardiovascular: Normal rate, regular rhythm, normal heart sounds and intact distal pulses.  No murmur heard. Pulmonary/Chest: Effort normal and breath sounds normal. No respiratory distress. She has no wheezes. She  has no rales.  Abdominal: Soft. Bowel sounds are normal. She exhibits no distension. There is no tenderness. There is no rebound.  Lymphadenopathy:    She has no cervical adenopathy.  Skin: She is not diaphoretic.  Vitals reviewed.         Assessment & Plan:  Abdominal bloating  Dyspnea on exertion  I believe the chronic constipation is likely causing the patient's abdominal bloating.  I recommended that she try Linzess 74 mcg p.o. daily every day and then reassess the patient in 1 week to see if the bloating has improved after having several days of normal defecation.  I am not certain of the cause of her dyspnea on exertion.  She denies any emergency symptoms such as angina or atypical chest pain.  Differential diagnosis includes systolic dysfunction, diastolic dysfunction, ischemic cardiomyopathy, underlying pulmonary pathology, deconditioning/aging.  I have recommended that the patient check her blood pressure every day at home and also whenever she feels short of breath.  I want to see her back in 1 week to reassess her blood pressure.  If her blood pressure is extremely high when she feels short of breath, I would focus on better control of her hypertension.  If her blood pressure is excellent at home as she states that it is, I would proceed with a chest x-ray and echocardiogram.

## 2017-10-01 ENCOUNTER — Other Ambulatory Visit: Payer: Self-pay

## 2017-10-01 ENCOUNTER — Encounter: Payer: Self-pay | Admitting: Family Medicine

## 2017-10-01 ENCOUNTER — Ambulatory Visit (INDEPENDENT_AMBULATORY_CARE_PROVIDER_SITE_OTHER): Payer: Medicare HMO | Admitting: Family Medicine

## 2017-10-01 VITALS — BP 148/78 | HR 70 | Temp 98.6°F | Resp 16 | Ht 64.0 in | Wt 256.0 lb

## 2017-10-01 DIAGNOSIS — I10 Essential (primary) hypertension: Secondary | ICD-10-CM

## 2017-10-01 NOTE — Progress Notes (Signed)
Subjective:    Patient ID: [name], female    DOB: [birth year], 77 y.o.   MRN: [id number] 09/24/17 Patient presents with 2 major complaints today.  First she reports feeling abdominal bloating.  She states that whenever she wakes up in the morning she feels extremely full and distended.  She will frequently have to burp or pass gas.  Symptoms improve when she passes gas or has a bowel movement.  Sometimes she will have 1 or 2 days in between having a bowel movement.  She feels bloated and constipated despite taking MiraLAX as well as Benefiber.  She denies any melena.  She denies any hematochezia.  She denies any nausea vomiting or diarrhea.  She denies any fevers or chills or dysuria.  Second, she reports feeling "short winded".  She states that with minimal activity when she is sedentary she feels fine.  However when she starts to exercise or she gets up and starts to work in the yard, she states that she will become short of breath more quickly than she used to.  She denies any angina.  She denies any chest pain.  She states that it takes a moderate amount of activity to make her feel short of breath.  She denies any cough or pleurisy or hemoptysis.  She denies any orthopnea or paroxysmal nocturnal dyspnea.  She had a normal stress test in 2012.  She has had no further work-up since.  Her blood pressure is elevated today however the patient, as always, is adamant that her blood pressure is better at home.  At that time, my plan was: I believe the chronic constipation is likely causing the patient's abdominal bloating.  I recommended that she try Linzess 74 mcg p.o. daily every day and then reassess the patient in 1 week to see if the bloating has improved after having several days of normal defecation.  I am not certain of the cause of her dyspnea on exertion.  She denies any emergency symptoms such as angina or atypical chest pain.  Differential diagnosis includes systolic dysfunction, diastolic  dysfunction, ischemic cardiomyopathy, underlying pulmonary pathology, deconditioning/aging.  I have recommended that the patient check her blood pressure every day at home and also whenever she feels short of breath.  I want to see her back in 1 week to reassess her blood pressure.  If her blood pressure is extremely high when she feels short of breath, I would focus on better control of her hypertension.  If her blood pressure is excellent at home as she states that it is, I would proceed with a chest x-ray and  10/01/17 Patient feels much better after taking Linzess.  The abdominal bloating has subsided.  She has had several regular bowel movements.  Her blood pressure today is slightly elevated 148/78.  She brings me several readings from home.  She is checking it every day and her blood pressure averages between 118 and 130/62-73.  She states that her shortness of breath is only with strenuous activity such as going up a flight of steps.  Previously it was with minimal activity however today she states that she can walk to the mailbox without any shortness of breath or chest pain.  She denies any orthopnea or paroxysmal nocturnal dyspnea.  She denies any chest pressure. Past Medical History:  Diagnosis Date  . Arthritis    rheumatoid arthritis  . Brain tumor ([place])   . Chest pain   . Colon polyps   .  Dyspepsia   . Gallbladder sludge   . Hypercholesterolemia   . Hypertension   . Left atrial dilatation    Mild  . Mild aortic sclerosis ([facility])    without stenosis  . Mitral annular calcification    Mild  . Mitral valve disorder    Mildly thickened mitral valve, but with normal leaflet but with normal leaflet   . 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 lt eye implant  1998  . rt knee surg  1991  . TUBAL LIGATION  1981   Current Outpatient Medications on File Prior to Visit  Medication Sig Dispense  Refill  . ACCU-CHEK SOFTCLIX LANCETS lancets     . amLODipine (NORVASC) 10 MG tablet TAKE 1 TABLET EVERY DAY 90 tablet 3  . Ascorbic Acid (VITAMIN C) 1000 MG tablet Take 1,000 mg by mouth daily.      Marland Kitchen aspirin 81 MG chewable tablet Chew 81 mg by mouth daily.      Marland Kitchen atorvastatin (LIPITOR) 80 MG tablet Take 0.5 tablets (40 mg total) by mouth daily. 90 tablet 3  . Cholecalciferol (VITAMIN D) 2000 UNITS tablet Take 1,000 Units by mouth daily.     . Coenzyme Q10 (CO Q-10) 100 MG CAPS Take by mouth.    . fenofibrate 54 MG tablet TAKE 1 TABLET EVERY DAY 90 tablet 3  . ferrous sulfate 325 (65 FE) MG EC tablet Take 325 mg by mouth 3 (three) times daily with meals.    Marland Kitchen glucose blood (ACCU-CHEK AVIVA PLUS) test strip CHECK DAILY 100 each 3  . Lancet Devices (ACCU-CHEK SOFTCLIX) lancets Check BS qd - DX- E11.9 1 each 5  . linaclotide (LINZESS) 72 MCG capsule Take 72 mcg by mouth daily before breakfast.    . lisinopril-hydrochlorothiazide (ZESTORETIC) 20-12.5 MG tablet Take 2 tablets by mouth daily. 180 tablet 3  . metoprolol succinate (TOPROL-XL) 50 MG 24 hr tablet TAKE 1 TABLET EVERY DAY. TAKE WITH OR IMMEDIATELY FOLLOWING A MEAL. 90 tablet 3  . naproxen sodium (ALEVE) 220 MG tablet Take 220 mg by mouth 2 (two) times daily with a meal.    . Omega-3 Fatty Acids (FISH OIL CONCENTRATE PO) Take by mouth.    Vladimir Faster Glycol-Propyl Glycol (SYSTANE) 0.4-0.3 % SOLN Apply to eye.    . polyethylene glycol (MIRALAX / GLYCOLAX) packet Take 17 g by mouth daily.    . Probiotic Product (PROBIOTIC DAILY PO) Take by mouth.    . pyridOXINE (VITAMIN B-6) 100 MG tablet Take 100 mg by mouth daily as needed.      No current facility-administered medications on file prior to visit.    Allergies  Allergen Reactions  . Vytorin [Ezetimibe-Simvastatin] Other (See Comments)    myalgia   Social History   Socioeconomic History  . Marital status: Widowed    Spouse name: Not on file  . Number of children: Not on file  .  Years of education: Not on file  . Highest education level: Not on file  Occupational History  . Not on file  Social Needs  . Financial resource strain: Not on file  . Food insecurity:    Worry: Not on file    Inability: Not on file  . Transportation needs:    Medical: Not on file    Non-medical: Not on file  Tobacco Use  . Smoking status: Former Smoker  Last attempt to quit: 09/07/1973    Years since quitting: 44.0  . Smokeless tobacco: Never Used  Substance and Sexual Activity  . Alcohol use: No  . Drug use: No  . Sexual activity: Not on file  Lifestyle  . Physical activity:    Days per week: Not on file    Minutes per session: Not on file  . Stress: Not on file  Relationships  . Social connections:    Talks on phone: Not on file    Gets together: Not on file    Attends religious service: Not on file    Active member of club or organization: Not on file    Attends meetings of clubs or organizations: Not on file    Relationship status: Not on file  . Intimate partner violence:    Fear of current or ex partner: Not on file    Emotionally abused: Not on file    Physically abused: Not on file    Forced sexual activity: Not on file  Other Topics Concern  . Not on file  Social History Narrative  . Not on file      Review of Systems  All other systems reviewed and are negative.      Objective:   Physical Exam  Constitutional: She appears well-developed and well-nourished. No distress.  HENT:  Head: Normocephalic and atraumatic.  Neck: Neck supple. No JVD present. No thyromegaly present.  Cardiovascular: Normal rate, regular rhythm, normal heart sounds and intact distal pulses.  No murmur heard. Pulmonary/Chest: Effort normal and breath sounds normal. No respiratory distress. She has no wheezes. She has no rales.  Abdominal: Soft. Bowel sounds are normal. She exhibits no distension. There is no tenderness. There is no rebound.  Lymphadenopathy:    She has no  cervical adenopathy.  Skin: She is not diaphoretic.  Vitals reviewed.         Assessment & Plan:  Ess htn Home blood pressures look well-controlled.  I will make no changes in her antihypertensives at the present time.  Abdominal bloating has subsided on Linzess.  After further discussion, I think the patient's shortness of breath is due to deconditioning.  It only occurs with strenuous activity and the patient is relatively sedentary.  Therefore I have recommended gradual increase in activity to build conditioning.  If she develops any chest pain or worsening shortness of breath she is to contact me immediately and we can arrange further diagnostic work-up.

## 2017-11-12 ENCOUNTER — Other Ambulatory Visit: Payer: Self-pay | Admitting: Family Medicine

## 2017-12-06 ENCOUNTER — Ambulatory Visit (INDEPENDENT_AMBULATORY_CARE_PROVIDER_SITE_OTHER): Payer: Medicare HMO | Admitting: Family Medicine

## 2017-12-06 ENCOUNTER — Encounter: Payer: Self-pay | Admitting: Family Medicine

## 2017-12-06 ENCOUNTER — Other Ambulatory Visit: Payer: Self-pay

## 2017-12-06 VITALS — BP 142/76 | HR 72 | Temp 98.8°F | Resp 16 | Ht 64.0 in | Wt 256.0 lb

## 2017-12-06 DIAGNOSIS — Z0000 Encounter for general adult medical examination without abnormal findings: Secondary | ICD-10-CM | POA: Diagnosis not present

## 2017-12-06 DIAGNOSIS — Z780 Asymptomatic menopausal state: Secondary | ICD-10-CM | POA: Diagnosis not present

## 2017-12-06 DIAGNOSIS — Z13820 Encounter for screening for osteoporosis: Secondary | ICD-10-CM

## 2017-12-06 DIAGNOSIS — Z23 Encounter for immunization: Secondary | ICD-10-CM | POA: Diagnosis not present

## 2017-12-06 DIAGNOSIS — Z6841 Body Mass Index (BMI) 40.0 and over, adult: Secondary | ICD-10-CM | POA: Diagnosis not present

## 2017-12-06 DIAGNOSIS — R7303 Prediabetes: Secondary | ICD-10-CM

## 2017-12-06 DIAGNOSIS — E7800 Pure hypercholesterolemia, unspecified: Secondary | ICD-10-CM | POA: Diagnosis not present

## 2017-12-06 DIAGNOSIS — I10 Essential (primary) hypertension: Secondary | ICD-10-CM

## 2017-12-06 NOTE — Progress Notes (Signed)
Subjective:   [name] is a 77 y.o. female who presents for Medicare Annual (Subsequent) preventive examination.  Review of Systems:  Review of Systems  Constitutional: Negative.  Negative for activity change, appetite change, fatigue and unexpected weight change.  HENT: Negative.   Eyes: Negative.   Respiratory: Negative.  Negative for shortness of breath.   Cardiovascular: Negative.  Negative for chest pain, palpitations and leg swelling.  Gastrointestinal: Negative.  Negative for abdominal pain and blood in stool.  Endocrine: Negative.   Genitourinary: Negative.   Musculoskeletal: Negative.  Negative for arthralgias, gait problem, joint swelling and myalgias.  Skin: Negative.  Negative for color change, pallor and rash.  Allergic/Immunologic: Negative.   Neurological: Negative.  Negative for syncope and weakness.  Hematological: Negative.   Psychiatric/Behavioral: Negative.  Negative for confusion, dysphoric mood, self-injury and suicidal ideas. The patient is not nervous/anxious.     Cardiac Risk Factors include: obesity (BMI >30kg/m2);hypertension;dyslipidemia;diabetes mellitus;advanced age (>13men, >27 women)     Objective:     Vitals: BP (!) 142/76   Pulse 72   Temp 98.8 F (37.1 C) (Oral)   Resp 16   Ht 5\' 4"  (1.626 m)   Wt 256 lb (116.1 kg)   SpO2 97%   BMI 43.94 kg/m   Body mass index is 43.94 kg/m.  Physical Exam  Constitutional: She appears well-developed.  Elderly female, obese, well appearing, appears stated age  HENT:  Head: Normocephalic and atraumatic.  Nose: Nose normal.  Eyes: Conjunctivae are normal. Right eye exhibits no discharge. Left eye exhibits no discharge.  Neck: No tracheal deviation present.  Cardiovascular: Normal rate and regular rhythm.  Pulmonary/Chest: Effort normal. No stridor. No respiratory distress.  Musculoskeletal: Normal range of motion.  Neurological: She is alert. She exhibits normal muscle tone. Coordination  normal.  Skin: Skin is warm and dry. No rash noted.  Psychiatric: She has a normal mood and affect. Her behavior is normal.  Nursing note and vitals reviewed.   Advanced Directives 12/06/2017  Does Patient Have a Medical Advance Directive? No  Would patient like information on creating a medical advance directive? Yes (MAU/Ambulatory/Procedural Areas - Information given)  Given packed of advanced directives, living will, POA and advised to discuss with family member (son), she states she is working on this already  Tobacco Social History   Tobacco Use  Smoking Status Former Smoker  . Last attempt to quit: 09/07/1973  . Years since quitting: 44.2  Smokeless Tobacco Never Used     Counseling given: Not Answered   Clinical Intake:  Pre-visit preparation completed: No  Pain : No/denies pain     BMI - recorded: 43.9 Nutritional Status: BMI > 30  Obese Diabetes: Yes CBG done?: No Did pt. bring in CBG monitor from home?: No     Interpreter Needed?: No     Past Medical History:  Diagnosis Date  . Arthritis    rheumatoid arthritis  . Brain tumor ([place])   . Chest pain   . Colon polyps   . Dyspepsia   . Gallbladder sludge   . Hypercholesterolemia   . Hypertension   . Left atrial dilatation    Mild  . Mild aortic sclerosis (HCC)    without stenosis  . Mitral annular calcification    Mild  . Mitral valve disorder    Mildly thickened mitral valve, but with normal leaflet but with normal leaflet   . 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 lt eye implant  1998  . rt knee surg  1991  . TUBAL LIGATION  1981   Family History  Problem Relation Age of Onset  . Stroke Paternal Grandmother   . Diabetes Mother   . Diabetes Sister   . Diabetes Grandchild    Social History   Socioeconomic History  . Marital status: Widowed    Spouse name: Not on file  . Number of children:  Not on file  . Years of education: Not on file  . Highest education level: Not on file  Occupational History  . Not on file  Social Needs  . Financial resource strain: Not on file  . Food insecurity:    Worry: Not on file    Inability: Not on file  . Transportation needs:    Medical: Not on file    Non-medical: Not on file  Tobacco Use  . Smoking status: Former Smoker    Last attempt to quit: 09/07/1973    Years since quitting: 44.2  . Smokeless tobacco: Never Used  Substance and Sexual Activity  . Alcohol use: No  . Drug use: No  . Sexual activity: Not Currently  Lifestyle  . Physical activity:    Days per week: Not on file    Minutes per session: Not on file  . Stress: Not on file  Relationships  . Social connections:    Talks on phone: Not on file    Gets together: Not on file    Attends religious service: Not on file    Active member of club or organization: Not on file    Attends meetings of clubs or organizations: Not on file    Relationship status: Not on file  Other Topics Concern  . Not on file  Social History Narrative  . Not on file    Outpatient Encounter Medications as of 12/06/2017  Medication Sig  . ACCU-CHEK SOFTCLIX LANCETS lancets   . amLODipine (NORVASC) 10 MG tablet TAKE 1 TABLET EVERY DAY  . Ascorbic Acid (VITAMIN C) 1000 MG tablet Take 1,000 mg by mouth daily.    Marland Kitchen aspirin 81 MG chewable tablet Chew 81 mg by mouth daily.    Marland Kitchen atorvastatin (LIPITOR) 80 MG tablet TAKE 1/2 TABLET EVERY DAY  . Cholecalciferol (VITAMIN D) 2000 UNITS tablet Take 1,000 Units by mouth daily.   . Coenzyme Q10 (CO Q-10) 100 MG CAPS Take by mouth.  . fenofibrate 54 MG tablet TAKE 1 TABLET EVERY DAY  . ferrous sulfate 325 (65 FE) MG EC tablet Take 325 mg by mouth 3 (three) times daily with meals.  Marland Kitchen glucose blood (ACCU-CHEK AVIVA PLUS) test strip CHECK DAILY  . Lancet Devices (ACCU-CHEK SOFTCLIX) lancets Check BS qd - DX- E11.9  . linaclotide (LINZESS) 72 MCG capsule Take  72 mcg by mouth daily before breakfast.  . lisinopril-hydrochlorothiazide (ZESTORETIC) 20-12.5 MG tablet Take 2 tablets by mouth daily.  . metoprolol succinate (TOPROL-XL) 50 MG 24 hr tablet TAKE 1 TABLET EVERY DAY. TAKE WITH OR IMMEDIATELY FOLLOWING A MEAL.  . naproxen sodium (ALEVE) 220 MG tablet Take 220 mg by mouth 2 (two) times daily with a meal.  . Omega-3 Fatty Acids (FISH OIL CONCENTRATE PO) Take by mouth.  Vladimir Faster Glycol-Propyl Glycol (SYSTANE) 0.4-0.3 % SOLN Apply to eye.  . polyethylene glycol (MIRALAX / GLYCOLAX) packet Take 17 g by mouth daily.  . Probiotic  Product (PROBIOTIC DAILY PO) Take by mouth.  . pyridOXINE (VITAMIN B-6) 100 MG tablet Take 100 mg by mouth daily as needed.   . Zoster Vaccine Adjuvanted [facility] injection Inject 0.5 mLs into the muscle once for 1 dose.   No facility-administered encounter medications on file as of 12/06/2017.     Activities of Daily Living In your present state of health, do you have any difficulty performing the following activities: 12/06/2017  Hearing? Y  Vision? Y  Difficulty concentrating or making decisions? N  Walking or climbing stairs? N  Dressing or bathing? N  Doing errands, shopping? N  Preparing Food and eating ? N  Using the Toilet? N  In the past six months, have you accidently leaked urine? N  Do you have problems with loss of bowel control? N  Managing your Medications? N  Managing your Finances? N  Housekeeping or managing your Housekeeping? 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  Exercise Activities and Dietary recommendations Current Exercise Habits: Home exercise routine, Type of exercise: stretching;strength training/weights;walking, Time (Minutes): 30, Frequency  (Times/Week): 7, Weekly Exercise (Minutes/Week): 210, Intensity: Mild, Exercise limited by: orthopedic condition(s)  Goals    . Weight (lb) < 200 lb (90.7 kg)     Will refer you to nutrition to help you with food choices.  Keep up the good work with your daily exercises!         Fall Risk Fall Risk  12/06/2017 06/14/2017 08/07/2013  Falls in the past year? No No No   Is the patient's home free of loose throw rugs in walkways, pet beds, electrical cords, etc?   yes      Grab bars in the bathroom? no      Handrails on the stairs?   yes      Adequate lighting?   yes   Depression Screen PHQ 2/9 Scores 12/06/2017 06/14/2017 02/11/2016 08/07/2013  PHQ - 2 Score 0 0 0 0  PHQ- 9 Score 0 - - -      Office Visit from 12/06/2017 in [place]  AUDIT-C Score  0      Cognitive Function MMSE - Mini Mental State Exam 12/06/2017  Orientation to time 5  Orientation to Place 5  Registration 3  Attention/ Calculation 0  Recall 3  Language- name 2 objects 2  Language- repeat 1  Language- follow 3 step command 3  Language- read & follow direction 1  Write a sentence 1  Copy design 0  Total score 24    Only deficit with the math, otherwise normal.  Cognitive Testing   Alert? Yes  Normal Appearance?Yes  Oriented to person? Yes  Place? Yes  Time? Yes  Recall of three objects? Yes  Can perform simple calculations? Yes  Displays appropriate judgment?Yes  Can read the correct time from a watch face?Yes         Immunization History  Administered Date(s) Administered  . Influenza Whole 01/17/2010, 02/07/2012  . Influenza, High Dose Seasonal PF 02/13/2017  . Influenza,inj,Quad PF,6+ Mos 02/04/2013, 02/11/2014, 02/16/2015, 01/18/2016  . Pneumococcal Conjugate-13 05/28/2014  . Pneumococcal Polysaccharide-23 04/25/2005, 02/11/2013  . Td 10/14/1998  . Tdap 12/06/2017     Qualifies for Shingles Vaccine? Yes, not  previously administered - ordered and sent to  pharmacy  Screening Tests Health Maintenance  Topic Date Due  . DEXA SCAN  07/28/2006  . INFLUENZA VACCINE  12/13/2017  . TETANUS/TDAP  12/07/2027  . PNA vac Low Risk Adult  Completed    Cancer Screenings: Lung: Low Dose CT Chest recommended if Age 79-80 years, 30 pack-year currently smoking OR have quit w/in 15years. Patient does not qualify. Breast:  Up to date on Mammogram? Yes  - 03/28/17 Up to date of Bone Density/Dexa? No - ordered Colorectal: Done 12/27//2018  Additional Screenings:  Hepatitis C Screening: done      Plan:      Problem List Items Addressed This Visit      Cardiovascular and Mediastinum   HTN (hypertension) (Chronic)   Relevant Orders   CBC with Differential/Platelet (Completed)   COMPLETE METABOLIC PANEL WITH GFR (Completed)   Lipid panel (Completed)   Amb ref to Medical Nutrition Therapy-MNT     Other   HLD (hyperlipidemia)   Relevant Orders   CBC with Differential/Platelet (Completed)   COMPLETE METABOLIC PANEL WITH GFR (Completed)   Lipid panel (Completed)   Amb ref to Medical Nutrition Therapy-MNT   Obesity   Relevant Orders   CBC with Differential/Platelet (Completed)   COMPLETE METABOLIC PANEL WITH GFR (Completed)   Hemoglobin A1c (Completed)   Lipid panel (Completed)   Amb ref to Medical Nutrition Therapy-MNT    Other Visit Diagnoses    Encounter for Medicare annual wellness exam    -  Primary   Need for shingles vaccine       Relevant Medications   Zoster Vaccine Adjuvanted [facility]) injection   Screening for osteoporosis       due for Dexa    Relevant Orders   DG Bone Density   Prediabetes       Relevant Orders   CBC with Differential/Platelet (Completed)   COMPLETE METABOLIC PANEL WITH GFR (Completed)   Hemoglobin A1c (Completed)   Amb ref to Medical Nutrition Therapy-MNT   Need for tetanus, diphtheria, and acellular pertussis (Tdap) vaccine in patient of adolescent age or older       Relevant Orders   Tdap vaccine  greater than or equal to 7yo IM (Completed)   Postmenopausal       Relevant Orders   DG Bone Density      I have personally reviewed and noted the following in the patient's chart:   . Medical and social history . Use of alcohol, tobacco or illicit drugs  . Current medications and supplements . Functional ability and status . Nutritional status . Physical activity . Advanced directives . List of other physicians . Hospitalizations, surgeries, and ER visits in previous 12 months . Vitals . Screenings to include cognitive, depression, and falls . Referrals and appointments  In addition, I have reviewed and discussed with patient certain preventive protocols, quality metrics, and best practice recommendations. A written personalized care plan for preventive services as well as general preventive health recommendations were provided to patient.     Delsa Grana, PA-C  12/11/2017

## 2017-12-06 NOTE — Patient Instructions (Addendum)
[name] , Thank you for taking time to come for your Medicare Wellness Visit. I appreciate your ongoing commitment to your health goals. Please review the following plan we discussed and let me know if I can assist you in the future.   These are the goals we discussed: Goals    . Weight (lb) < 200 lb (90.7 kg)     Will refer you to nutrition to help you with food choices.  Keep up the good work with your daily exercises!         This is a list of the screening recommended for you and due dates:  Health Maintenance  Topic Date Due  . DEXA scan (bone density measurement)  07/28/2006  . Tetanus Vaccine  10/13/2008  . Flu Shot  12/13/2017  . Pneumonia vaccines  Completed     Bone Densitometry Bone densitometry is an imaging test that uses a special X-ray to measure the amount of calcium and other minerals in your bones (bone density). This test is also known as a bone mineral density test or dual-energy X-ray absorptiometry (DXA). The test can measure bone density at your hip and your spine. It is similar to having a regular X-ray. You may have this test to:  Diagnose a condition that causes weak or thin bones (osteoporosis).  Predict your risk of a broken bone (fracture).  Determine how well osteoporosis treatment is working.  Tell a health care provider about:  Any allergies you have.  All medicines you are taking, including vitamins, herbs, eye drops, creams, and over-the-counter medicines.  Any problems you or family members have had with anesthetic medicines.  Any blood disorders you have.  Any surgeries you have had.  Any medical conditions you have.  Possibility of pregnancy.  Any other medical test you had within the previous 14 days that used contrast material. What are the risks? Generally, this is a safe procedure. However, problems can occur and may include the following:  This test exposes you to a very small amount of radiation.  The risks of  radiation exposure may be greater to unborn children.  What happens before the procedure?  Do not take any calcium supplements for 24 hours before having the test. You can otherwise eat and drink what you usually do.  Take off all metal jewelry, eyeglasses, dental appliances, and any other metal objects. What happens during the procedure?  You may lie on an exam table. There will be an X-ray generator below you and an imaging device above you.  Other devices, such as boxes or braces, may be used to position your body properly for the scan.  You will need to lie still while the machine slowly scans your body.  The images will show up on a computer monitor. What happens after the procedure? You may need more testing at a later time. This information is not intended to replace advice given to you by your health care provider. Make sure you discuss any questions you have with your health care provider. Document Released: 05/23/2004 Document Revised: 10/07/2015 Document Reviewed: 10/09/2013 Elsevier Interactive Patient Education  2018 [employer].   Fat and Cholesterol Restricted Diet Getting too much fat and cholesterol in your diet may cause health problems. Following this diet helps keep your fat and cholesterol at normal levels. This can keep you from getting sick. What types of fat should I choose?  Choose monosaturated and polyunsaturated fats. These are found in foods such as olive oil,  canola oil, flaxseeds, walnuts, almonds, and seeds.  Eat more omega-3 fats. Good choices include salmon, mackerel, sardines, tuna, flaxseed oil, and ground flaxseeds.  Limit saturated fats. These are in animal products such as meats, butter, and cream. They can also be in plant products such as palm oil, palm kernel oil, and coconut oil.  Avoid foods with partially hydrogenated oils in them. These contain trans fats. Examples of foods that have trans fats are stick margarine, some tub margarines,  cookies, crackers, and other baked goods. What general guidelines do I need to follow?  Check food labels. Look for the words "trans fat" and "saturated fat."  When preparing a meal: ? Fill half of your plate with vegetables and green salads. ? Fill one fourth of your plate with whole grains. Look for the word "whole" as the first word in the ingredient list. ? Fill one fourth of your plate with lean protein foods.  Eat more foods that have fiber, like apples, carrots, beans, peas, and barley.  Eat more home-cooked foods. Eat less at restaurants and buffets.  Limit or avoid alcohol.  Limit foods high in starch and sugar.  Limit fried foods.  Cook foods without frying them. Baking, boiling, grilling, and broiling are all great options.  Lose weight if you are overweight. Losing even a small amount of weight can help your overall health. It can also help prevent diseases such as diabetes and heart disease. What foods can I eat? Grains Whole grains, such as whole wheat or whole grain breads, crackers, cereals, and pasta. Unsweetened oatmeal, bulgur, barley, quinoa, or brown rice. Corn or whole wheat flour tortillas. Vegetables Fresh or frozen vegetables (raw, steamed, roasted, or grilled). Green salads. Fruits All fresh, canned (in natural juice), or frozen fruits. Meat and Other Protein Products Ground beef (85% or leaner), grass-fed beef, or beef trimmed of fat. Skinless chicken or [place]. Ground chicken or [place]. Pork trimmed of fat. All fish and seafood. Eggs. Dried beans, peas, or lentils. Unsalted nuts or seeds. Unsalted canned or dry beans. Dairy Low-fat dairy products, such as skim or 1% milk, 2% or reduced-fat cheeses, low-fat ricotta or cottage cheese, or plain low-fat yogurt. Fats and Oils Tub margarines without trans fats. Light or reduced-fat mayonnaise and salad dressings. Avocado. Olive, canola, sesame, or safflower oils. Natural peanut or almond butter (choose ones  without added sugar and oil). The items listed above may not be a complete list of recommended foods or beverages. Contact your dietitian for more options. What foods are not recommended? Grains White bread. White pasta. White rice. Cornbread. Bagels, pastries, and croissants. Crackers that contain trans fat. Vegetables White potatoes. Corn. Creamed or fried vegetables. Vegetables in a cheese sauce. Fruits Dried fruits. Canned fruit in light or heavy syrup. Fruit juice. Meat and Other Protein Products Fatty cuts of meat. Ribs, chicken wings, bacon, sausage, bologna, salami, chitterlings, fatback, hot dogs, bratwurst, and packaged luncheon meats. Liver and organ meats. Dairy Whole or 2% milk, cream, half-and-half, and cream cheese. Whole milk cheeses. Whole-fat or sweetened yogurt. Full-fat cheeses. Nondairy creamers and whipped toppings. Processed cheese, cheese spreads, or cheese curds. Sweets and Desserts Corn syrup, sugars, honey, and molasses. Candy. Jam and jelly. Syrup. Sweetened cereals. Cookies, pies, cakes, donuts, muffins, and ice cream. Fats and Oils Butter, stick margarine, lard, shortening, ghee, or bacon fat. Coconut, palm kernel, or palm oils. Beverages Alcohol. Sweetened drinks (such as sodas, lemonade, and fruit drinks or punches). The items listed above may not be  a complete list of foods and beverages to avoid. Contact your dietitian for more information. This information is not intended to replace advice given to you by your health care provider. Make sure you discuss any questions you have with your health care provider. Document Released: 10/31/2011 Document Revised: 01/06/2016 Document Reviewed: 07/31/2013 Elsevier Interactive Patient Education  2018 [place] in the Home Falls can cause injuries. They can happen to people of all ages. There are many things you can do to make your home safe and to help prevent falls. What can I do on the  outside of my home?  Regularly fix the edges of walkways and driveways and fix any cracks.  Remove anything that might make you trip as you walk through a door, such as a raised step or threshold.  Trim any bushes or trees on the path to your home.  Use bright outdoor lighting.  Clear any walking paths of anything that might make someone trip, such as rocks or tools.  Regularly check to see if handrails are loose or broken. Make sure that both sides of any steps have handrails.  Any raised decks and porches should have guardrails on the edges.  Have any leaves, snow, or ice cleared regularly.  Use sand or salt on walking paths during winter.  Clean up any spills in your garage right away. This includes oil or grease spills. What can I do in the bathroom?  Use night lights.  Install grab bars by the toilet and in the tub and shower. Do not use towel bars as grab bars.  Use non-skid mats or decals in the tub or shower.  If you need to sit down in the shower, use a plastic, non-slip stool.  Keep the floor dry. Clean up any water that spills on the floor as soon as it happens.  Remove soap buildup in the tub or shower regularly.  Attach bath mats securely with double-sided non-slip rug tape.  Do not have throw rugs and other things on the floor that can make you trip. What can I do in the bedroom?  Use night lights.  Make sure that you have a light by your bed that is easy to reach.  Do not use any sheets or blankets that are too big for your bed. They should not hang down onto the floor.  Have a firm chair that has side arms. You can use this for support while you get dressed.  Do not have throw rugs and other things on the floor that can make you trip. What can I do in the kitchen?  Clean up any spills right away.  Avoid walking on wet floors.  Keep items that you use a lot in easy-to-reach places.  If you need to reach something above you, use a strong step  stool that has a grab bar.  Keep electrical cords out of the way.  Do not use floor polish or wax that makes floors slippery. If you must use wax, use non-skid floor wax.  Do not have throw rugs and other things on the floor that can make you trip. What can I do with my stairs?  Do not leave any items on the stairs.  Make sure that there are handrails on both sides of the stairs and use them. Fix handrails that are broken or loose. Make sure that handrails are as long as the stairways.  Check any carpeting to make sure that it is  firmly attached to the stairs. Fix any carpet that is loose or worn.  Avoid having throw rugs at the top or bottom of the stairs. If you do have throw rugs, attach them to the floor with carpet tape.  Make sure that you have a light switch at the top of the stairs and the bottom of the stairs. If you do not have them, ask someone to add them for you. What else can I do to help prevent falls?  Wear shoes that: ? Do not have high heels. ? Have rubber bottoms. ? Are comfortable and fit you well. ? Are closed at the toe. Do not wear sandals.  If you use a stepladder: ? Make sure that it is fully opened. Do not climb a closed stepladder. ? Make sure that both sides of the stepladder are locked into place. ? Ask someone to hold it for you, if possible.  Clearly mark and make sure that you can see: ? Any grab bars or handrails. ? First and last steps. ? Where the edge of each step is.  Use tools that help you move around (mobility aids) if they are needed. These include: ? Canes. ? Walkers. ? Scooters. ? Crutches.  Turn on the lights when you go into a dark area. Replace any light bulbs as soon as they burn out.  Set up your furniture so you have a clear path. Avoid moving your furniture around.  If any of your floors are uneven, fix them.  If there are any pets around you, be aware of where they are.  Review your medicines with your doctor. Some  medicines can make you feel dizzy. This can increase your chance of falling. Ask your doctor what other things that you can do to help prevent falls. This information is not intended to replace advice given to you by your health care provider. Make sure you discuss any questions you have with your health care provider. Document Released: 02/25/2009 Document Revised: 10/07/2015 Document Reviewed: 06/05/2014 Elsevier Interactive Patient Education  2018 [place] Maintenance, Female Adopting a healthy lifestyle and getting preventive care can go a long way to promote health and wellness. Talk with your health care provider about what schedule of regular examinations is right for you. This is a good chance for you to check in with your provider about disease prevention and staying healthy. In between checkups, there are plenty of things you can do on your own. Experts have done a lot of research about which lifestyle changes and preventive measures are most likely to keep you healthy. Ask your health care provider for more information. Weight and diet Eat a healthy diet  Be sure to include plenty of vegetables, fruits, low-fat dairy products, and lean protein.  Do not eat a lot of foods high in solid fats, added sugars, or salt.  Get regular exercise. This is one of the most important things you can do for your health. ? Most adults should exercise for at least 150 minutes each week. The exercise should increase your heart rate and make you sweat (moderate-intensity exercise). ? Most adults should also do strengthening exercises at least twice a week. This is in addition to the moderate-intensity exercise.  Maintain a healthy weight  Body mass index (BMI) is a measurement that can be used to identify possible weight problems. It estimates body fat based on height and weight. Your health care provider can help determine your BMI and help you achieve  or maintain a healthy weight.  For  females 57 years of age and older: ? A BMI below 18.5 is considered underweight. ? A BMI of 18.5 to 24.9 is normal. ? A BMI of 25 to 29.9 is considered overweight. ? A BMI of 30 and above is considered obese.  Watch levels of cholesterol and blood lipids  You should start having your blood tested for lipids and cholesterol at 76 years of age, then have this test every 5 years.  You may need to have your cholesterol levels checked more often if: ? Your lipid or cholesterol levels are high. ? You are older than 76 years of age. ? You are at high risk for heart disease.  Cancer screening Lung Cancer  Lung cancer screening is recommended for adults 31-76 years old who are at high risk for lung cancer because of a history of smoking.  A yearly low-dose CT scan of the lungs is recommended for people who: ? Currently smoke. ? Have quit within the past 15 years. ? Have at least a 30-pack-year history of smoking. A pack year is smoking an average of one pack of cigarettes a day for 1 year.  Yearly screening should continue until it has been 15 years since you quit.  Yearly screening should stop if you develop a health problem that would prevent you from having lung cancer treatment.  Breast Cancer  Practice breast self-awareness. This means understanding how your breasts normally appear and feel.  It also means doing regular breast self-exams. Let your health care provider know about any changes, no matter how small.  If you are in your 20s or 30s, you should have a clinical breast exam (CBE) by a health care provider every 1-3 years as part of a regular health exam.  If you are 70 or older, have a CBE every year. Also consider having a breast X-ray (mammogram) every year.  If you have a family history of breast cancer, talk to your health care provider about genetic screening.  If you are at high risk for breast cancer, talk to your health care provider about having an MRI and a  mammogram every year.  Breast cancer gene (BRCA) assessment is recommended for women who have family members with BRCA-related cancers. BRCA-related cancers include: ? Breast. ? Ovarian. ? Tubal. ? Peritoneal cancers.  Results of the assessment will determine the need for genetic counseling and BRCA1 and BRCA2 testing.  Cervical Cancer Your health care provider may recommend that you be screened regularly for cancer of the pelvic organs (ovaries, uterus, and vagina). This screening involves a pelvic examination, including checking for microscopic changes to the surface of your cervix (Pap test). You may be encouraged to have this screening done every 3 years, beginning at age 65.  For women ages 73-65, health care providers may recommend pelvic exams and Pap testing every 3 years, or they may recommend the Pap and pelvic exam, combined with testing for human papilloma virus (HPV), every 5 years. Some types of HPV increase your risk of cervical cancer. Testing for HPV may also be done on women of any age with unclear Pap test results.  Other health care providers may not recommend any screening for nonpregnant women who are considered low risk for pelvic cancer and who do not have symptoms. Ask your health care provider if a screening pelvic exam is right for you.  If you have had past treatment for cervical cancer or a condition that could  lead to cancer, you need Pap tests and screening for cancer for at least 20 years after your treatment. If Pap tests have been discontinued, your risk factors (such as having a new sexual partner) need to be reassessed to determine if screening should resume. Some women have medical problems that increase the chance of getting cervical cancer. In these cases, your health care provider may recommend more frequent screening and Pap tests.  Colorectal Cancer  This type of cancer can be detected and often prevented.  Routine colorectal cancer screening usually  begins at 76 years of age and continues through 76 years of age.  Your health care provider may recommend screening at an earlier age if you have risk factors for colon cancer.  Your health care provider may also recommend using home test kits to check for hidden blood in the stool.  A small camera at the end of a tube can be used to examine your colon directly (sigmoidoscopy or colonoscopy). This is done to check for the earliest forms of colorectal cancer.  Routine screening usually begins at age 31.  Direct examination of the colon should be repeated every 5-10 years through 76 years of age. However, you may need to be screened more often if early forms of precancerous polyps or small growths are found.  Skin Cancer  Check your skin from head to toe regularly.  Tell your health care provider about any new moles or changes in moles, especially if there is a change in a mole's shape or color.  Also tell your health care provider if you have a mole that is larger than the size of a pencil eraser.  Always use sunscreen. Apply sunscreen liberally and repeatedly throughout the day.  Protect yourself by wearing long sleeves, pants, a wide-brimmed hat, and sunglasses whenever you are outside.  Heart disease, diabetes, and high blood pressure  High blood pressure causes heart disease and increases the risk of stroke. High blood pressure is more likely to develop in: ? People who have blood pressure in the high end of the normal range (130-139/85-89 mm Hg). ? People who are overweight or obese. ? People who are African American.  If you are 79-2 years of age, have your blood pressure checked every 3-5 years. If you are 52 years of age or older, have your blood pressure checked every year. You should have your blood pressure measured twice-once when you are at a hospital or clinic, and once when you are not at a hospital or clinic. Record the average of the two measurements. To check your  blood pressure when you are not at a hospital or clinic, you can use: ? An automated blood pressure machine at a pharmacy. ? A home blood pressure monitor.  If you are between 54 years and 16 years old, ask your health care provider if you should take aspirin to prevent strokes.  Have regular diabetes screenings. This involves taking a blood sample to check your fasting blood sugar level. ? If you are at a normal weight and have a low risk for diabetes, have this test once every three years after 76 years of age. ? If you are overweight and have a high risk for diabetes, consider being tested at a younger age or more often. Preventing infection Hepatitis B  If you have a higher risk for hepatitis B, you should be screened for this virus. You are considered at high risk for hepatitis B if: ? You were born in  a country where hepatitis B is common. Ask your health care provider which countries are considered high risk. ? Your parents were born in a high-risk country, and you have not been immunized against hepatitis B (hepatitis B vaccine). ? You have HIV or AIDS. ? You use needles to inject street drugs. ? You live with someone who has hepatitis B. ? You have had sex with someone who has hepatitis B. ? You get hemodialysis treatment. ? You take certain medicines for conditions, including cancer, organ transplantation, and autoimmune conditions.  Hepatitis C  Blood testing is recommended for: ? Everyone born from [birth year] through 1964. ? Anyone with known risk factors for hepatitis C.  Sexually transmitted infections (STIs)  You should be screened for sexually transmitted infections (STIs) including gonorrhea and chlamydia if: ? You are sexually active and are younger than 76 years of age. ? You are older than 76 years of age and your health care provider tells you that you are at risk for this type of infection. ? Your sexual activity has changed since you were last screened and you are at  an increased risk for chlamydia or gonorrhea. Ask your health care provider if you are at risk.  If you do not have HIV, but are at risk, it may be recommended that you take a prescription medicine daily to prevent HIV infection. This is called pre-exposure prophylaxis (PrEP). You are considered at risk if: ? You are sexually active and do not regularly use condoms or know the HIV status of your partner(s). ? You take drugs by injection. ? You are sexually active with a partner who has HIV.  Talk with your health care provider about whether you are at high risk of being infected with HIV. If you choose to begin PrEP, you should first be tested for HIV. You should then be tested every 3 months for as long as you are taking PrEP. Pregnancy  If you are premenopausal and you may become pregnant, ask your health care provider about preconception counseling.  If you may become pregnant, take 400 to 800 micrograms (mcg) of folic acid every day.  If you want to prevent pregnancy, talk to your health care provider about birth control (contraception). Osteoporosis and menopause  Osteoporosis is a disease in which the bones lose minerals and strength with aging. This can result in serious bone fractures. Your risk for osteoporosis can be identified using a bone density scan.  If you are 61 years of age or older, or if you are at risk for osteoporosis and fractures, ask your health care provider if you should be screened.  Ask your health care provider whether you should take a calcium or vitamin D supplement to lower your risk for osteoporosis.  Menopause may have certain physical symptoms and risks.  Hormone replacement therapy may reduce some of these symptoms and risks. Talk to your health care provider about whether hormone replacement therapy is right for you. Follow these instructions at home:  Schedule regular health, dental, and eye exams.  Stay current with your immunizations.  Do not  use any tobacco products including cigarettes, chewing tobacco, or electronic cigarettes.  If you are pregnant, do not drink alcohol.  If you are breastfeeding, limit how much and how often you drink alcohol.  Limit alcohol intake to no more than 1 drink per day for nonpregnant women. One drink equals 12 ounces of beer, 5 ounces of wine, or 1 ounces of hard liquor.  Do  not use street drugs.  Do not share needles.  Ask your health care provider for help if you need support or information about quitting drugs.  Tell your health care provider if you often feel depressed.  Tell your health care provider if you have ever been abused or do not feel safe at home. This information is not intended to replace advice given to you by your health care provider. Make sure you discuss any questions you have with your health care provider. Document Released: 11/14/2010 Document Revised: 10/07/2015 Document Reviewed: 02/02/2015 Elsevier Interactive Patient Education  [employer].

## 2017-12-07 LAB — COMPLETE METABOLIC PANEL WITH GFR
AG Ratio: 1.6 (calc) (ref 1.0–2.5)
ALBUMIN MSPROF: 4.5 g/dL (ref 3.6–5.1)
ALKALINE PHOSPHATASE (APISO): 45 U/L (ref 33–130)
ALT: 18 U/L (ref 6–29)
AST: 20 U/L (ref 10–35)
BILIRUBIN TOTAL: 0.5 mg/dL (ref 0.2–1.2)
BUN: 21 mg/dL (ref 7–25)
CO2: 28 mmol/L (ref 20–32)
CREATININE: 0.82 mg/dL (ref 0.60–0.93)
Calcium: 10.2 mg/dL (ref 8.6–10.4)
Chloride: 104 mmol/L (ref 98–110)
GFR, Est African American: 81 mL/min/{1.73_m2} (ref >=60)
GFR, Est Non African American: 70 mL/min/{1.73_m2} (ref >=60)
Globulin: 2.9 g/dL (calc) (ref 1.9–3.7)
Glucose, Bld: 101 mg/dL — ABNORMAL HIGH (ref 65–99)
Potassium: 3.8 mmol/L (ref 3.5–5.3)
SODIUM: 139 mmol/L (ref 135–146)
Total Protein: 7.4 g/dL (ref 6.1–8.1)

## 2017-12-07 LAB — CBC WITH DIFFERENTIAL/PLATELET
BASOS ABS: 32 {cells}/uL (ref 0–200)
Basophils Relative: 0.5 %
Eosinophils Absolute: 198 cells/uL (ref 15–500)
Eosinophils Relative: 3.1 %
HEMATOCRIT: 35.8 % (ref 35.0–45.0)
HEMOGLOBIN: 12 g/dL (ref 11.7–15.5)
Lymphs Abs: 2438 cells/uL (ref 850–3900)
MCH: 26.8 pg — ABNORMAL LOW (ref 27.0–33.0)
MCHC: 33.5 g/dL (ref 32.0–36.0)
MCV: 80.1 fL (ref 80.0–100.0)
MPV: 10.1 fL (ref 7.5–12.5)
Monocytes Relative: 8.8 %
NEUTROS ABS: 3168 {cells}/uL (ref 1500–7800)
Neutrophils Relative %: 49.5 %
Platelets: 325 10*3/uL (ref 140–400)
RBC: 4.47 10*6/uL (ref 3.80–5.10)
RDW: 15.1 % — ABNORMAL HIGH (ref 11.0–15.0)
Total Lymphocyte: 38.1 %
WBC mixed population: 563 cells/uL (ref 200–950)
WBC: 6.4 10*3/uL (ref 3.8–10.8)

## 2017-12-07 LAB — LIPID PANEL
CHOL/HDL RATIO: 3 (calc) (ref <5.0)
CHOLESTEROL: 194 mg/dL (ref <200)
HDL: 64 mg/dL (ref >50)
LDL Cholesterol (Calc): 112 mg/dL (calc) — ABNORMAL HIGH
Non-HDL Cholesterol (Calc): 130 mg/dL (calc) — ABNORMAL HIGH (ref <130)
Triglycerides: 79 mg/dL (ref <150)

## 2017-12-07 LAB — HEMOGLOBIN A1C
HEMOGLOBIN A1C: 5.8 %{Hb} — AB (ref <5.7)
Mean Plasma Glucose: 120 (calc)
eAG (mmol/L): 6.6 (calc)

## 2017-12-07 NOTE — Progress Notes (Signed)
Blood work looks good.  The sugar levels are the same as before, mild pre-diabetes, continue to monitor as directed by PCP (Dr. Dennard Schaumann). Kidney function, liver function, electrolytes, and blood counts look great.  Cholesterol is higher than previous labs.  Work on diet - focus on eating foods low in saturated and trans fats, increase vegetables and whole grains.  Limit sweets, foods high in salt (sodium) red meats and fatty meats. Can follow up with Dr. Dennard Schaumann at your next appointment in November.

## 2017-12-11 ENCOUNTER — Encounter: Payer: Self-pay | Admitting: Family Medicine

## 2017-12-11 MED ORDER — ZOSTER VAC RECOMB ADJUVANTED 50 MCG/0.5ML IM SUSR: 0.5000 mL | Freq: Once | INTRAMUSCULAR | 0 refills | Status: AC

## 2018-01-10 DIAGNOSIS — H524 Presbyopia: Secondary | ICD-10-CM | POA: Diagnosis not present

## 2018-01-10 DIAGNOSIS — Z0100 Encounter for examination of eyes and vision without abnormal findings: Secondary | ICD-10-CM | POA: Diagnosis not present

## 2018-01-25 ENCOUNTER — Ambulatory Visit (INDEPENDENT_AMBULATORY_CARE_PROVIDER_SITE_OTHER): Payer: Medicare HMO | Admitting: Family Medicine

## 2018-01-25 DIAGNOSIS — Z23 Encounter for immunization: Secondary | ICD-10-CM

## 2018-02-01 DIAGNOSIS — I10 Essential (primary) hypertension: Secondary | ICD-10-CM | POA: Diagnosis not present

## 2018-02-01 DIAGNOSIS — H4720 Unspecified optic atrophy: Secondary | ICD-10-CM | POA: Diagnosis not present

## 2018-02-01 DIAGNOSIS — E119 Type 2 diabetes mellitus without complications: Secondary | ICD-10-CM | POA: Diagnosis not present

## 2018-02-07 ENCOUNTER — Ambulatory Visit (INDEPENDENT_AMBULATORY_CARE_PROVIDER_SITE_OTHER): Payer: Medicare HMO | Admitting: Family Medicine

## 2018-02-07 ENCOUNTER — Other Ambulatory Visit: Payer: Self-pay | Admitting: Family Medicine

## 2018-02-07 ENCOUNTER — Encounter: Payer: Self-pay | Admitting: Family Medicine

## 2018-02-07 VITALS — BP 160/70 | HR 80 | Temp 98.4°F | Resp 18 | Ht 64.0 in | Wt 252.0 lb

## 2018-02-07 DIAGNOSIS — I10 Essential (primary) hypertension: Secondary | ICD-10-CM

## 2018-02-07 DIAGNOSIS — T464X5A Adverse effect of angiotensin-converting-enzyme inhibitors, initial encounter: Secondary | ICD-10-CM | POA: Diagnosis not present

## 2018-02-07 DIAGNOSIS — R05 Cough: Secondary | ICD-10-CM | POA: Diagnosis not present

## 2018-02-07 MED ORDER — LOSARTAN POTASSIUM-HCTZ 100-25 MG PO TABS: 1.0000 | ORAL_TABLET | Freq: Every day | ORAL | 3 refills | Status: DC

## 2018-02-07 MED ORDER — LEVOCETIRIZINE DIHYDROCHLORIDE 5 MG PO TABS: 5.0000 mg | ORAL_TABLET | Freq: Every evening | ORAL | 0 refills | Status: DC

## 2018-02-07 MED ORDER — GLUCOSE BLOOD VI STRP: ORAL_STRIP | 3 refills | Status: DC

## 2018-02-07 MED ORDER — ACCU-CHEK SOFTCLIX LANCETS MISC: 3 refills | Status: DC

## 2018-02-07 MED ORDER — ACCU-CHEK AVIVA PLUS W/DEVICE KIT: PACK | 0 refills | Status: DC

## 2018-02-07 NOTE — Progress Notes (Signed)
Subjective:    Patient ID: [name], female    DOB: 21-Oct-1940, 77 y.o.   MRN: [id number] Patient states that she has had an irritating cough for more than a month.  Cough is a tickle-like sensation in the back of her throat.  However when she coughs, it is hard for her to stop.  Cough is nonproductive.  She denies any chest pain, shortness of breath, purulent sputum.  She denies any fevers or chills or shortness of breath.  She denies any rhinorrhea or sneezing or allergies.  She denies any heartburn or chest pain.  She is on lisinopril for blood pressure Past Medical History:  Diagnosis Date  . Arthritis    rheumatoid arthritis  . Brain tumor ([place])   . Chest pain   . Colon polyps   . Dyspepsia   . Gallbladder sludge   . Hypercholesterolemia   . Hypertension   . Left atrial dilatation    Mild  . Mild aortic sclerosis (HCC)    without stenosis  . Mitral annular calcification    Mild  . Mitral valve disorder    Mildly thickened mitral valve, but with normal leaflet but with normal leaflet   . 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 lt eye implant  1998  . rt knee surg  1991  . TUBAL LIGATION  1981   Current Outpatient Medications on File Prior to Visit  Medication Sig Dispense Refill  . ACCU-CHEK SOFTCLIX LANCETS lancets     . amLODipine (NORVASC) 10 MG tablet TAKE 1 TABLET EVERY DAY 90 tablet 3  . Ascorbic Acid (VITAMIN C) 1000 MG tablet Take 1,000 mg by mouth daily.      Marland Kitchen aspirin 81 MG chewable tablet Chew 81 mg by mouth daily.      Marland Kitchen atorvastatin (LIPITOR) 80 MG tablet TAKE 1/2 TABLET EVERY DAY 45 tablet 3  . Cholecalciferol (VITAMIN D) 2000 UNITS tablet Take 1,000 Units by mouth daily.     . Coenzyme Q10 (CO Q-10) 100 MG CAPS Take by mouth.    . fenofibrate 54 MG tablet TAKE 1 TABLET EVERY DAY 90 tablet 3  . glucose blood (ACCU-CHEK AVIVA PLUS) test strip CHECK DAILY 100  each 3  . Lancet Devices (ACCU-CHEK SOFTCLIX) lancets Check BS qd - DX- E11.9 1 each 5  . linaclotide (LINZESS) 72 MCG capsule Take 72 mcg by mouth daily before breakfast.    . metoprolol succinate (TOPROL-XL) 50 MG 24 hr tablet TAKE 1 TABLET EVERY DAY. TAKE WITH OR IMMEDIATELY FOLLOWING A MEAL. 90 tablet 3  . naproxen sodium (ALEVE) 220 MG tablet Take 220 mg by mouth 2 (two) times daily with a meal.    . Omega-3 Fatty Acids (FISH OIL CONCENTRATE PO) Take by mouth.    Vladimir Faster Glycol-Propyl Glycol (SYSTANE) 0.4-0.3 % SOLN Apply to eye.    . polyethylene glycol (MIRALAX / GLYCOLAX) packet Take 17 g by mouth daily.    . Probiotic Product (PROBIOTIC DAILY PO) Take by mouth.    . pyridOXINE (VITAMIN B-6) 100 MG tablet Take 100 mg by mouth daily as needed.     . ferrous sulfate 325 (65 FE) MG EC tablet Take 325 mg by mouth 3 (three) times daily with meals.     No current facility-administered medications on file prior to visit.    Allergies  Allergen Reactions  . Vytorin [Ezetimibe-Simvastatin] Other (See Comments)    myalgia   Social History   Socioeconomic History  . Marital status: Widowed    Spouse name: Not on file  . Number of children: Not on file  . Years of education: Not on file  . Highest education level: Not on file  Occupational History  . Not on file  Social Needs  . Financial resource strain: Not on file  . Food insecurity:    Worry: Not on file    Inability: Not on file  . Transportation needs:    Medical: Not on file    Non-medical: Not on file  Tobacco Use  . Smoking status: Former Smoker    Last attempt to quit: 09/07/1973    Years since quitting: 44.4  . Smokeless tobacco: Never Used  Substance and Sexual Activity  . Alcohol use: No  . Drug use: No  . Sexual activity: Not Currently  Lifestyle  . Physical activity:    Days per week: Not on file    Minutes per session: Not on file  . Stress: Not on file  Relationships  . Social connections:    Talks  on phone: Not on file    Gets together: Not on file    Attends religious service: Not on file    Active member of club or organization: Not on file    Attends meetings of clubs or organizations: Not on file    Relationship status: Not on file  . Intimate partner violence:    Fear of current or ex partner: Not on file    Emotionally abused: Not on file    Physically abused: Not on file    Forced sexual activity: Not on file  Other Topics Concern  . Not on file  Social History Narrative  . Not on file      Review of Systems  All other systems reviewed and are negative.      Objective:   Physical Exam  Constitutional: She appears well-developed and well-nourished. No distress.  HENT:  Head: Normocephalic and atraumatic.  Neck: Neck supple. No JVD present. No thyromegaly present.  Cardiovascular: Normal rate, regular rhythm, normal heart sounds and intact distal pulses.  No murmur heard. Pulmonary/Chest: Effort normal and breath sounds normal. No respiratory distress. She has no wheezes. She has no rales.  Abdominal: Soft. Bowel sounds are normal. She exhibits no distension. There is no tenderness. There is no rebound.  Lymphadenopathy:    She has no cervical adenopathy.  Skin: She is not diaphoretic.  Vitals reviewed.         Assessment & Plan:  Cough Ess htn  To most likely causes on the differential diagnosis would be seasonal allergies such as ragweed causing a postnasal drip cough versus ACE inhibitor induced cough.  Therefore I will discontinue lisinopril HCTZ and replace with losartan HCTZ 100/25 1 p.o. daily.  I will add Xyzal 5 mg p.o. nightly.  Recheck cough in 2 weeks.  If persistent, proceed with chest x-ray.  If worsening recheck sooner.  Discontinue Xyzal once cough is better.

## 2018-03-02 ENCOUNTER — Other Ambulatory Visit: Payer: Self-pay | Admitting: Family Medicine

## 2018-03-04 ENCOUNTER — Other Ambulatory Visit: Payer: Self-pay | Admitting: *Deleted

## 2018-03-04 MED ORDER — LOSARTAN POTASSIUM-HCTZ 100-25 MG PO TABS: 1.0000 | ORAL_TABLET | Freq: Every day | ORAL | 3 refills | Status: DC

## 2018-03-15 ENCOUNTER — Other Ambulatory Visit: Payer: Self-pay | Admitting: Family Medicine

## 2018-03-15 DIAGNOSIS — Z1231 Encounter for screening mammogram for malignant neoplasm of breast: Secondary | ICD-10-CM

## 2018-04-03 ENCOUNTER — Ambulatory Visit (HOSPITAL_COMMUNITY)
Admission: RE | Admit: 2018-04-03 | Discharge: 2018-04-03 | Disposition: A | Discharge status: Home / Self Care | Payer: Medicare HMO | Source: Ambulatory Visit | Attending: Family Medicine | Admitting: Family Medicine

## 2018-04-03 ENCOUNTER — Encounter (HOSPITAL_COMMUNITY): Payer: Self-pay

## 2018-04-03 DIAGNOSIS — Z1231 Encounter for screening mammogram for malignant neoplasm of breast: Secondary | ICD-10-CM

## 2018-04-08 ENCOUNTER — Ambulatory Visit: Payer: Self-pay | Admitting: Family Medicine

## 2018-04-10 ENCOUNTER — Other Ambulatory Visit: Payer: Self-pay | Admitting: Family Medicine

## 2018-04-18 ENCOUNTER — Ambulatory Visit (INDEPENDENT_AMBULATORY_CARE_PROVIDER_SITE_OTHER): Payer: Medicare HMO | Admitting: Family Medicine

## 2018-04-18 DIAGNOSIS — R7303 Prediabetes: Secondary | ICD-10-CM | POA: Diagnosis not present

## 2018-04-18 DIAGNOSIS — I10 Essential (primary) hypertension: Secondary | ICD-10-CM

## 2018-04-18 DIAGNOSIS — E7800 Pure hypercholesterolemia, unspecified: Secondary | ICD-10-CM | POA: Diagnosis not present

## 2018-04-18 MED ORDER — DOXAZOSIN MESYLATE 2 MG PO TABS: 2.0000 mg | ORAL_TABLET | Freq: Every day | ORAL | 1 refills | Status: DC

## 2018-04-18 NOTE — Progress Notes (Signed)
Subjective:    Patient ID: [name], female    DOB: [name] 20, 1942, 77 y.o.   MRN: [id number] Patient is doing well today.  She is here today for follow-up.  However her systolic blood pressure is extremely high.  My nurse got 168.  I recheck it and found it to be 160.  Patient denies any chest pain shortness of breath or dyspnea on exertion.  She is here today to recheck her cholesterol along with monitoring her prediabetes.  She denies any myalgias or right upper quadrant pain.  She denies any polyuria polydipsia or blurry vision.  Her flu shot is up-to-date.  She denies any cough. Past Medical History:  Diagnosis Date  . Arthritis    rheumatoid arthritis  . Brain tumor ([place])   . Chest pain   . Colon polyps   . Dyspepsia   . Gallbladder sludge   . Hypercholesterolemia   . Hypertension   . Left atrial dilatation    Mild  . Mild aortic sclerosis (H[facility])    without stenosis  . Mitral annular calcification    Mild  . Mitral valve disorder    Mildly thickened mitral valve, but with normal leaflet but with normal leaflet   . 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 lt eye implant  1998  . rt knee surg  1991  . TUBAL LIGATION  1981   Current Outpatient Medications on File Prior to Visit  Medication Sig Dispense Refill  . ACCU-CHEK SOFTCLIX LANCETS lancets Check bs qam 100 each 3  . amLODipine (NORVASC) 10 MG tablet TAKE 1 TABLET EVERY DAY 90 tablet 3  . Ascorbic Acid (VITAMIN C) 1000 MG tablet Take 1,000 mg by mouth daily.      Marland Kitchen aspirin 81 MG chewable tablet Chew 81 mg by mouth daily.      Marland Kitchen atorvastatin (LIPITOR) 80 MG tablet TAKE 1/2 TABLET EVERY DAY 45 tablet 3  . Blood Glucose Monitoring Suppl (ACCU-CHEK AVIVA PLUS) w/Device KIT USE AS DIRECTED TO CHECK BLOOD SUGAR 1 kit 0  . Cholecalciferol (VITAMIN D) 2000 UNITS tablet Take 1,000 Units by mouth daily.     . Coenzyme Q10 (CO Q-10)  100 MG CAPS Take by mouth.    . fenofibrate 54 MG tablet TAKE 1 TABLET EVERY DAY 90 tablet 3  . ferrous sulfate 325 (65 FE) MG EC tablet Take 325 mg by mouth 3 (three) times daily with meals.    Marland Kitchen glucose blood (ACCU-CHEK AVIVA PLUS) test strip CHECK DAILY 100 each 3  . Lancet Devices (ACCU-CHEK SOFTCLIX) lancets Check BS qd - DX- E11.9 1 each 5  . levocetirizine (XYZAL) 5 MG tablet TAKE 1 TABLET BY MOUTH EVERY DAY IN THE EVENING 30 tablet 11  . linaclotide (LINZESS) 72 MCG capsule Take 72 mcg by mouth daily before breakfast.    . losartan-hydrochlorothiazide (HYZAAR) 100-25 MG tablet Take 1 tablet by mouth daily. 90 tablet 3  . metoprolol succinate (TOPROL-XL) 50 MG 24 hr tablet TAKE 1 TABLET EVERY DAY. TAKE WITH OR IMMEDIATELY FOLLOWING A MEAL. 90 tablet 3  . naproxen sodium (ALEVE) 220 MG tablet Take 220 mg by mouth 2 (two) times daily with a meal.    . Omega-3 Fatty Acids (FISH OIL CONCENTRATE PO) Take by mouth.    Vladimir Faster Glycol-Propyl Glycol (SYSTANE) 0.4-0.3 % SOLN Apply to eye.    Marland Kitchen  polyethylene glycol (MIRALAX / GLYCOLAX) packet Take 17 g by mouth daily.    . Probiotic Product (PROBIOTIC DAILY PO) Take by mouth.    . pyridOXINE (VITAMIN B-6) 100 MG tablet Take 100 mg by mouth daily as needed.      No current facility-administered medications on file prior to visit.    Allergies  Allergen Reactions  . Vytorin [Ezetimibe-Simvastatin] Other (See Comments)    myalgia   Social History   Socioeconomic History  . Marital status: Widowed    Spouse name: Not on file  . Number of children: Not on file  . Years of education: Not on file  . Highest education level: Not on file  Occupational History  . Not on file  Social Needs  . Financial resource strain: Not on file  . Food insecurity:    Worry: Not on file    Inability: Not on file  . Transportation needs:    Medical: Not on file    Non-medical: Not on file  Tobacco Use  . Smoking status: Former Smoker    Last attempt  to quit: 09/07/1973    Years since quitting: 44.6  . Smokeless tobacco: Never Used  Substance and Sexual Activity  . Alcohol use: No  . Drug use: No  . Sexual activity: Not Currently  Lifestyle  . Physical activity:    Days per week: Not on file    Minutes per session: Not on file  . Stress: Not on file  Relationships  . Social connections:    Talks on phone: Not on file    Gets together: Not on file    Attends religious service: Not on file    Active member of club or organization: Not on file    Attends meetings of clubs or organizations: Not on file    Relationship status: Not on file  . Intimate partner violence:    Fear of current or ex partner: Not on file    Emotionally abused: Not on file    Physically abused: Not on file    Forced sexual activity: Not on file  Other Topics Concern  . Not on file  Social History Narrative  . Not on file      Review of Systems  All other systems reviewed and are negative.      Objective:   Physical Exam  Constitutional: She appears well-developed and well-nourished. No distress.  HENT:  Head: Normocephalic and atraumatic.  Neck: Neck supple. No JVD present. No thyromegaly present.  Cardiovascular: Normal rate, regular rhythm, normal heart sounds and intact distal pulses.  No murmur heard. Pulmonary/Chest: Effort normal and breath sounds normal. No respiratory distress. She has no wheezes. She has no rales.  Abdominal: Soft. Bowel sounds are normal. She exhibits no distension. There is no tenderness. There is no rebound.  Lymphadenopathy:    She has no cervical adenopathy.  Skin: She is not diaphoretic.  Vitals reviewed.         Assessment & Plan:  Essential hypertension - Plan: Hemoglobin A1c, CBC with Differential/Platelet, COMPLETE METABOLIC PANEL WITH GFR, Lipid panel  Pure hypercholesterolemia - Plan: Hemoglobin A1c, CBC with Differential/Platelet, COMPLETE METABOLIC PANEL WITH GFR, Lipid panel  Prediabetes -  Plan: Hemoglobin A1c, CBC with Differential/Platelet, COMPLETE METABOLIC PANEL WITH GFR, Lipid panel  Blood pressures too high.  I recommended starting doxazosin 2 mg p.o. daily and rechecking blood pressure in 2 weeks.  If the patient's blood pressures at home are truly better controlled  in the 1 30-1 40 range, she does not need to start this medication.  She will start checking her blood pressure 2-3 times a day and then let me know the values in 2 weeks.  I did send the prescription for the new medication in case her blood pressure is in fact confirmed to be in the 913 systolic at home as well.  I will also monitor a CBC, CMP, fasting lipid panel, and hemoglobin A1c.  Patient's flu shot is up-to-date.

## 2018-04-19 LAB — COMPLETE METABOLIC PANEL WITH GFR
AG RATIO: 1.3 (calc) (ref 1.0–2.5)
ALT: 17 U/L (ref 6–29)
AST: 21 U/L (ref 10–35)
Albumin: 4.2 g/dL (ref 3.6–5.1)
Alkaline phosphatase (APISO): 45 U/L (ref 33–130)
BUN: 16 mg/dL (ref 7–25)
CALCIUM: 10.1 mg/dL (ref 8.6–10.4)
CO2: 27 mmol/L (ref 20–32)
CREATININE: 0.83 mg/dL (ref 0.60–0.93)
Chloride: 105 mmol/L (ref 98–110)
GFR, EST AFRICAN AMERICAN: 79 mL/min/{1.73_m2} (ref >=60)
GFR, EST NON AFRICAN AMERICAN: 68 mL/min/{1.73_m2} (ref >=60)
Globulin: 3.3 g/dL (calc) (ref 1.9–3.7)
Glucose, Bld: 102 mg/dL — ABNORMAL HIGH (ref 65–99)
Potassium: 3.6 mmol/L (ref 3.5–5.3)
Sodium: 141 mmol/L (ref 135–146)
TOTAL PROTEIN: 7.5 g/dL (ref 6.1–8.1)
Total Bilirubin: 0.5 mg/dL (ref 0.2–1.2)

## 2018-04-19 LAB — LIPID PANEL
CHOL/HDL RATIO: 2.8 (calc) (ref <5.0)
Cholesterol: 184 mg/dL (ref <200)
HDL: 66 mg/dL (ref >50)
LDL CHOLESTEROL (CALC): 104 mg/dL — AB
NON-HDL CHOLESTEROL (CALC): 118 mg/dL (ref <130)
Triglycerides: 56 mg/dL (ref <150)

## 2018-04-19 LAB — CBC WITH DIFFERENTIAL/PLATELET
BASOS PCT: 0.5 %
Basophils Absolute: 33 cells/uL (ref 0–200)
EOS ABS: 152 {cells}/uL (ref 15–500)
Eosinophils Relative: 2.3 %
HEMATOCRIT: 37.1 % (ref 35.0–45.0)
HEMOGLOBIN: 12.6 g/dL (ref 11.7–15.5)
LYMPHS ABS: 3016 {cells}/uL (ref 850–3900)
MCH: 26.5 pg — AB (ref 27.0–33.0)
MCHC: 34 g/dL (ref 32.0–36.0)
MCV: 77.9 fL — AB (ref 80.0–100.0)
MPV: 10.4 fL (ref 7.5–12.5)
Monocytes Relative: 9.6 %
NEUTROS ABS: 2765 {cells}/uL (ref 1500–7800)
Neutrophils Relative %: 41.9 %
PLATELETS: 298 10*3/uL (ref 140–400)
RBC: 4.76 10*6/uL (ref 3.80–5.10)
RDW: 15.6 % — ABNORMAL HIGH (ref 11.0–15.0)
TOTAL LYMPHOCYTE: 45.7 %
WBC mixed population: 634 cells/uL (ref 200–950)
WBC: 6.6 10*3/uL (ref 3.8–10.8)

## 2018-04-19 LAB — HEMOGLOBIN A1C
HEMOGLOBIN A1C: 5.9 %{Hb} — AB (ref <5.7)
Mean Plasma Glucose: 123 (calc)
eAG (mmol/L): 6.8 (calc)

## 2018-05-27 ENCOUNTER — Other Ambulatory Visit: Payer: Self-pay | Admitting: Family Medicine

## 2018-06-17 ENCOUNTER — Other Ambulatory Visit: Payer: Self-pay | Admitting: Family Medicine

## 2018-07-15 ENCOUNTER — Ambulatory Visit (INDEPENDENT_AMBULATORY_CARE_PROVIDER_SITE_OTHER): Payer: Medicare HMO | Admitting: Family Medicine

## 2018-07-15 ENCOUNTER — Encounter: Payer: Self-pay | Admitting: Family Medicine

## 2018-07-15 VITALS — BP 140/68 | HR 70 | Temp 98.0°F | Resp 18 | Ht 64.0 in | Wt 260.0 lb

## 2018-07-15 DIAGNOSIS — I209 Angina pectoris, unspecified: Secondary | ICD-10-CM

## 2018-07-15 MED ORDER — DOXAZOSIN MESYLATE 1 MG PO TABS: 1.0000 mg | ORAL_TABLET | Freq: Every day | ORAL | 3 refills | Status: DC

## 2018-07-15 NOTE — Progress Notes (Signed)
Subjective:    Patient ID: [name], female    DOB: 1941-03-12, 78 y.o.   MRN: [id number] Patient is here today primarily to follow-up for her blood pressure.  Since I last saw the patient, she had to start doxazosin 2 mg a day as her blood pressure systolic was greater than 140.  She has been on the medication now for approximately 2 weeks.  Her blood pressures are now well controlled in the 130s to 140/60-70.  However she does report orthostatic dizziness and is having a difficult time adjusting to the medication.  During her review of systems she does endorse chest pain.  When I asked her to be more specific, the patient states that whenever she exercises vigorously on a treadmill or doing her stair climber which she does regularly she will develop shortness of breath with activity.  She will also develop a pressure-like sensation in the left side of her chest.  If she stops and rests the pain will go away.  Her last stress test was in 2012 and at that time her ejection fraction was normal and there was no evidence of reversible ischemia.  However risk factors for underlying coronary artery disease include hypertension, hyperlipidemia, and advanced age   Past Medical History:  Diagnosis Date  . Arthritis    rheumatoid arthritis  . Brain tumor ([place])   . Chest pain   . Colon polyps   . Dyspepsia   . Gallbladder sludge   . Hypercholesterolemia   . Hypertension   . Left atrial dilatation    Mild  . Mild aortic sclerosis (HCC)    without stenosis  . Mitral annular calcification    Mild  . Mitral valve disorder    Mildly thickened mitral valve, but with normal leaflet but with normal leaflet   . 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 lt eye implant  1998  . rt knee surg  1991  . TUBAL LIGATION  1981   Current Outpatient Medications on File Prior to Visit  Medication Sig Dispense  Refill  . ACCU-CHEK SOFTCLIX LANCETS lancets Check bs qam 100 each 3  . amLODipine (NORVASC) 10 MG tablet TAKE 1 TABLET EVERY DAY 90 tablet 3  . Ascorbic Acid (VITAMIN C) 1000 MG tablet Take 1,000 mg by mouth daily.      Marland Kitchen aspirin 81 MG chewable tablet Chew 81 mg by mouth daily.      Marland Kitchen atorvastatin (LIPITOR) 80 MG tablet TAKE 1/2 TABLET EVERY DAY 45 tablet 3  . Blood Glucose Monitoring Suppl (ACCU-CHEK AVIVA PLUS) w/Device KIT USE AS DIRECTED TO CHECK BLOOD SUGAR 1 kit 0  . Cholecalciferol (VITAMIN D) 2000 UNITS tablet Take 1,000 Units by mouth daily.     . Coenzyme Q10 (CO Q-10) 100 MG CAPS Take by mouth.    . doxazosin (CARDURA) 2 MG tablet Take 1 tablet (2 mg total) by mouth daily. 30 tablet 1  . fenofibrate 54 MG tablet TAKE 1 TABLET EVERY DAY 90 tablet 3  . ferrous sulfate 325 (65 FE) MG EC tablet Take 325 mg by mouth 3 (three) times daily with meals.    Marland Kitchen glucose blood (ACCU-CHEK AVIVA PLUS) test strip CHECK DAILY 100 each 3  . Lancet Devices (ACCU-CHEK SOFTCLIX) lancets Check BS qd - DX- E11.9 1 each 5  . levocetirizine (XYZAL) 5 MG tablet  TAKE 1 TABLET BY MOUTH EVERY DAY IN THE EVENING 30 tablet 11  . linaclotide (LINZESS) 72 MCG capsule Take 72 mcg by mouth daily before breakfast.    . losartan-hydrochlorothiazide (HYZAAR) 100-25 MG tablet Take 1 tablet by mouth daily. 90 tablet 3  . metoprolol succinate (TOPROL-XL) 50 MG 24 hr tablet TAKE 1 TABLET EVERY DAY. TAKE WITH OR IMMEDIATELY FOLLOWING A MEAL. 90 tablet 3  . naproxen sodium (ALEVE) 220 MG tablet Take 220 mg by mouth 2 (two) times daily with a meal.    . Omega-3 Fatty Acids (FISH OIL CONCENTRATE PO) Take by mouth.    Vladimir Faster Glycol-Propyl Glycol (SYSTANE) 0.4-0.3 % SOLN Apply to eye.    . polyethylene glycol (MIRALAX / GLYCOLAX) packet Take 17 g by mouth daily.    . Probiotic Product (PROBIOTIC DAILY PO) Take by mouth.    . pyridOXINE (VITAMIN B-6) 100 MG tablet Take 100 mg by mouth daily as needed.      No current  facility-administered medications on file prior to visit.    Allergies  Allergen Reactions  . Vytorin [Ezetimibe-Simvastatin] Other (See Comments)    myalgia   Social History   Socioeconomic History  . Marital status: Widowed    Spouse name: Not on file  . Number of children: Not on file  . Years of education: Not on file  . Highest education level: Not on file  Occupational History  . Not on file  Social Needs  . Financial resource strain: Not on file  . Food insecurity:    Worry: Not on file    Inability: Not on file  . Transportation needs:    Medical: Not on file    Non-medical: Not on file  Tobacco Use  . Smoking status: Former Smoker    Last attempt to quit: 09/07/1973    Years since quitting: 44.8  . Smokeless tobacco: Never Used  Substance and Sexual Activity  . Alcohol use: No  . Drug use: No  . Sexual activity: Not Currently  Lifestyle  . Physical activity:    Days per week: Not on file    Minutes per session: Not on file  . Stress: Not on file  Relationships  . Social connections:    Talks on phone: Not on file    Gets together: Not on file    Attends religious service: Not on file    Active member of club or organization: Not on file    Attends meetings of clubs or organizations: Not on file    Relationship status: Not on file  . Intimate partner violence:    Fear of current or ex partner: Not on file    Emotionally abused: Not on file    Physically abused: Not on file    Forced sexual activity: Not on file  Other Topics Concern  . Not on file  Social History Narrative  . Not on file      Review of Systems  All other systems reviewed and are negative.      Objective:   Physical Exam  Constitutional: She appears well-developed and well-nourished. No distress.  HENT:  Head: Normocephalic and atraumatic.  Neck: Neck supple. No JVD present. No thyromegaly present.  Cardiovascular: Normal rate, regular rhythm, normal heart sounds and intact  distal pulses.  No murmur heard. Pulmonary/Chest: Effort normal and breath sounds normal. No respiratory distress. She has no wheezes. She has no rales.  Abdominal: Soft. Bowel sounds are normal. She exhibits  no distension. There is no abdominal tenderness. There is no rebound.  Lymphadenopathy:    She has no cervical adenopathy.  Skin: She is not diaphoretic.  Vitals reviewed.         Assessment & Plan:  Angina pectoris ([place]) - Plan: Ambulatory referral to Cardiology  Patient's blood pressures acceptable.  I will decrease her Cardura to 1 mg a day in an effort to try to reduce the orthostatic hypotension she has been experiencing.  I encouraged the patient to start aspirin 81 mg every day and I would like her to return fasting for a CMP and a fasting lipid panel to monitor her cholesterol.  Ideally I like her LDL cholesterol to be below 100 and is close to 70 as possible.  Her history is concerning for stable angina with vigorous activity.  Therefore I will refer the patient to her cardiologist for a stress test for further evaluation.

## 2018-08-16 ENCOUNTER — Other Ambulatory Visit: Payer: Self-pay | Admitting: Family Medicine

## 2018-08-30 ENCOUNTER — Other Ambulatory Visit: Payer: Self-pay | Admitting: Family Medicine

## 2018-09-02 ENCOUNTER — Telehealth: Payer: Self-pay | Admitting: Cardiology

## 2018-09-02 NOTE — Telephone Encounter (Signed)
Spoke with son (on [place]) and rescheduled pt's appt for the eval of chest pain to Wednesday 4/22 at 2 pm.  Aware we will call before the appt to review medications and VS.  Son does request a phone visit only as patient "is not too good with the smart phone."  Phone visit scheduled. Verbal consent obtained from son.

## 2018-09-02 NOTE — Telephone Encounter (Signed)
New Message:   Patient son calling concering appt. Please call back

## 2018-09-04 ENCOUNTER — Telehealth (INDEPENDENT_AMBULATORY_CARE_PROVIDER_SITE_OTHER): Payer: Medicare HMO | Admitting: Cardiology

## 2018-09-04 ENCOUNTER — Encounter: Payer: Self-pay | Admitting: Cardiology

## 2018-09-04 ENCOUNTER — Other Ambulatory Visit: Payer: Self-pay

## 2018-09-04 VITALS — BP 136/68 | HR 85 | Ht 64.0 in

## 2018-09-04 DIAGNOSIS — E119 Type 2 diabetes mellitus without complications: Secondary | ICD-10-CM

## 2018-09-04 DIAGNOSIS — I700 Atherosclerosis of aorta: Secondary | ICD-10-CM

## 2018-09-04 DIAGNOSIS — R0789 Other chest pain: Secondary | ICD-10-CM | POA: Diagnosis not present

## 2018-09-04 DIAGNOSIS — I10 Essential (primary) hypertension: Secondary | ICD-10-CM

## 2018-09-04 NOTE — Progress Notes (Signed)
Virtual Visit via Telephone Note   This visit type was conducted due to national recommendations for restrictions regarding the COVID-19 Pandemic (e.g. social distancing) in an effort to limit this patient's exposure and mitigate transmission in our community.  Due to her co-morbid illnesses, this patient is at least at moderate risk for complications without adequate follow up.  This format is felt to be most appropriate for this patient at this time.  The patient did not have access to video technology/had technical difficulties with video requiring transitioning to audio format only (telephone).  All issues noted in this document were discussed and addressed.  No physical exam could be performed with this format.  Please refer to the patient's chart for her  consent to telehealth for [facility].   Evaluation Performed:  Follow-up visit  Date:  09/04/2018   ID:  [name], [name] 12/27/1940, MRN [id number]  Patient Location: Home Provider Location: Home  PCP:  Susy Frizzle, MD  Cardiologist:  Candee Furbish, MD  Electrophysiologist:  None   Chief Complaint: Evaluation of chest pain at the request of Dr. Dennard Schaumann  History of Present Illness:    [name] is a 78 y.o. female with hypertension here for the evaluation of chest pain/angina at the request of Dr. Dennard Schaumann.  She described that she sometimes feels a little bit more tired after mowing the yard with a push mower and at sometimes has a sensation in her chest that when she burps it goes away.  The discomfort is mild to moderate and short-lived.  She wonders if it is from eating too many pecans.  When she walks quickly she does feel some shortness of breath.  She prides herself on the yard work that she does.  Started end of Feb. Sometimes changes with eating.   No heart issues with mother and father  Not a smoker. Walks with a push mower.   The patient does not have symptoms concerning for COVID-19  infection (fever, chills, cough, or new shortness of breath).    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     aspirin 81 MG chewable tablet Chew 81 mg by mouth daily.     atorvastatin (LIPITOR) 80 MG tablet TAKE 1/2 TABLET EVERY DAY   Blood Glucose Monitoring Suppl (ACCU-CHEK AVIVA PLUS) w/Device KIT USE AS DIRECTED TO CHECK BLOOD SUGAR   Cholecalciferol (VITAMIN D) 2000 UNITS tablet Take  1,000 Units by mouth daily.    Coenzyme Q10 (CO Q-10) 100 MG CAPS Take by mouth.   doxazosin (CARDURA) 1 MG tablet Take 1 tablet (1 mg total) by mouth daily.   fenofibrate 54 MG tablet TAKE 1 TABLET EVERY DAY   ferrous sulfate 325 (65 FE) MG EC tablet Take 325 mg by mouth 3 (three) times daily with meals.   glucose blood (ACCU-CHEK AVIVA PLUS) test strip CHECK DAILY   Lancet Devices (ACCU-CHEK SOFTCLIX) lancets Check BS qd - DX- E11.9   losartan-hydrochlorothiazide (HYZAAR) 100-25 MG tablet Take 1 tablet by mouth daily.   metoprolol succinate (TOPROL-XL) 50 MG 24 hr tablet TAKE 1 TABLET EVERY DAY. TAKE WITH OR IMMEDIATELY FOLLOWING A MEAL.   naproxen sodium (ALEVE) 220  MG tablet Take 220 mg by mouth daily as needed (knee pain).    Omega-3 Fatty Acids (FISH OIL CONCENTRATE PO) Take by mouth.   Polyethyl Glycol-Propyl Glycol (SYSTANE) 0.4-0.3 % SOLN Apply to eye.   polyethylene glycol (MIRALAX / GLYCOLAX) packet Take 17 g by mouth daily.   Probiotic Product (PROBIOTIC DAILY PO) Take by mouth.   pyridOXINE (VITAMIN B-6) 100 MG tablet Take 100 mg by mouth daily as needed.      Allergies:   Vytorin [ezetimibe-simvastatin]   Social History   Tobacco Use   Smoking status: Former Smoker    Last attempt to quit: 09/07/1973    Years since quitting: 45.0   Smokeless tobacco: Never Used  Substance Use Topics   Alcohol use: No   Drug use: No     Family Hx: The patient's family history includes Diabetes in her grandchild, mother, and sister; Stroke in her paternal grandmother.  ROS:   Please see the history of present illness.    Denies any fevers chills nausea vomiting syncope bleeding. All other systems reviewed and are negative.   Prior CV studies:   The following studies were reviewed today:  Prior nuclear stress test, 2-day study in 2012 was low risk.  Labs/Other Tests and Data Reviewed:    EKG:  An ECG dated 01/06/11 was personally reviewed today and demonstrated:  Sinus rhythm nonspecific ST-T wave changes  Recent Labs: 04/18/2018: ALT 17; BUN 16; Creat 0.83; Hemoglobin 12.6; Platelets 298; Potassium 3.6; Sodium 141   Recent Lipid Panel Lab Results  Component Value Date/Time   CHOL 184 04/18/2018 09:00 AM   TRIG 56 04/18/2018 09:00 AM   HDL 66 04/18/2018 09:00 AM   CHOLHDL 2.8 04/18/2018 09:00 AM   LDLCALC 104 (H) 04/18/2018 09:00 AM    Wt Readings from Last 3 Encounters:  07/15/18 260 lb (117.9 kg)  02/07/18 252 lb (114.3 kg)  12/06/17 256 lb (116.1 kg)     Objective:    Vital Signs:  BP 136/68    Pulse 85    Ht '5\' 4"'  (1.626 m)    BMI 44.63 kg/m    VITAL SIGNS:  reviewed Able to complete full sentences on the phone  without difficulty  ASSESSMENT & PLAN:     Atypical chest pain - Chest discomfort seems to be short-lived and is relieved after she burps but it is occurring sometimes when she is mowing her lawn with a push mower.  Atypical type feature.  Could be gastrointestinal.  I think at this time I will continue to watch this and touch base with her again in about 3 months.  She knows to let me know if if her symptoms escalate or become more worrisome  when at that time we will have low threshold for further testing. -I personally reviewed her prior CT scan of her abdomen and she certainly has aortic atherosclerosis.  Continue to treat with high intensity statin therapy, good blood pressure control and good diabetes control.  Aortic atherosclerosis -Continue to treat with good blood pressure control diabetes control statin.  Diabetes with hypertension - Dr. Dennard Schaumann providing excellent care.  Medications reviewed.  Morbid obesity - Continue to encourage weight loss.  She still remains quite active, takes pride in mowing her yard.  COVID-19 Education: The signs and symptoms of COVID-19 were discussed with the patient and how to seek care for testing (follow up with PCP or arrange E-visit).  The importance of social distancing was discussed today.  Time:   Today, I have spent 35 minutes with the patient with telehealth technology discussing the above problems and in review of chart.     Medication Adjustments/Labs and Tests Ordered: Current medicines are reviewed at length with the patient today.  Concerns regarding medicines are outlined above.   Tests Ordered: No orders of the defined types were placed in this encounter.   Medication Changes: No orders of the defined types were placed in this encounter.   Disposition:  Follow up in 3 month(s)  Signed, Candee Furbish, MD  09/04/2018 2:41 PM    Reading

## 2018-09-04 NOTE — Patient Instructions (Signed)
Medication Instructions:  Your provider recommends that you continue on your current medications as directed. Please refer to the Current Medication list given to you today.    Labwork: None  Testing/Procedures: None  Follow-Up: Your provider recommends that you schedule a follow-up appointment in: 3 months with Dr. Marlou Porch.

## 2018-09-17 ENCOUNTER — Ambulatory Visit: Payer: Self-pay | Admitting: Cardiology

## 2018-11-14 ENCOUNTER — Other Ambulatory Visit: Payer: Self-pay | Admitting: Family Medicine

## 2018-11-19 ENCOUNTER — Encounter: Payer: Self-pay | Admitting: Family Medicine

## 2018-11-19 ENCOUNTER — Other Ambulatory Visit: Payer: Self-pay

## 2018-11-19 ENCOUNTER — Ambulatory Visit (INDEPENDENT_AMBULATORY_CARE_PROVIDER_SITE_OTHER): Payer: Medicare HMO | Admitting: Family Medicine

## 2018-11-19 VITALS — BP 160/78 | HR 82 | Temp 98.2°F | Resp 18 | Ht 64.0 in | Wt 259.0 lb

## 2018-11-19 DIAGNOSIS — I10 Essential (primary) hypertension: Secondary | ICD-10-CM | POA: Diagnosis not present

## 2018-11-19 DIAGNOSIS — E7800 Pure hypercholesterolemia, unspecified: Secondary | ICD-10-CM

## 2018-11-19 DIAGNOSIS — R7303 Prediabetes: Secondary | ICD-10-CM

## 2018-11-19 DIAGNOSIS — Z13220 Encounter for screening for lipoid disorders: Secondary | ICD-10-CM | POA: Diagnosis not present

## 2018-11-19 DIAGNOSIS — E785 Hyperlipidemia, unspecified: Secondary | ICD-10-CM | POA: Diagnosis not present

## 2018-11-19 NOTE — Progress Notes (Signed)
Subjective:    Patient ID: [name], female    DOB: 02-20-1941, 78 y.o.   MRN: [id number] 07/2018 Patient is here today primarily to follow-up for her blood pressure.  Since I last saw the patient, she had to start doxazosin 2 mg a day as her blood pressure systolic was greater than 140.  She has been on the medication now for approximately 2 weeks.  Her blood pressures are now well controlled in the 130s to 140/60-70.  However she does report orthostatic dizziness and is having a difficult time adjusting to the medication.  During her review of systems she does endorse chest pain.  When I asked her to be more specific, the patient states that whenever she exercises vigorously on a treadmill or doing her stair climber which she does regularly she will develop shortness of breath with activity.  She will also develop a pressure-like sensation in the left side of her chest.  If she stops and rests the pain will go away.  Her last stress test was in 2012 and at that time her ejection fraction was normal and there was no evidence of reversible ischemia.  However risk factors for underlying coronary artery disease include hypertension, hyperlipidemia, and advanced age.  At that time, my plan was: Patient's blood pressures acceptable.  I will decrease her Cardura to 1 mg a day in an effort to try to reduce the orthostatic hypotension she has been experiencing.  I encouraged the patient to start aspirin 81 mg every day and I would like her to return fasting for a CMP and a fasting lipid panel to monitor her cholesterol.  Ideally I like her LDL cholesterol to be below 100 and is close to 70 as possible.  Her history is concerning for stable angina with vigorous activity.  Therefore I will refer the patient to her cardiologist for a stress test for further evaluation.  11/19/18 I reviewed her recent office visit with her cardiologist Dr. Marlou Porch.  Patient's chest pain that she described him was more atypical  than what she was describing to me.  In fact, since I last saw her, she denies any further pressure-like sensations or chest pain.  She is able to do her yard work without any chest discomfort.  She states that if she is extremely active she [name] get short of breath but otherwise is doing well.  She denies chest pain today.  Her blood pressure here is elevated at 160/78.  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  She checks her blood pressure on a daily basis at home and her blood pressure there is outstanding.  She has over 40 different readings for me today to review dating back to [name] 23.  All but 2 are within normal range averaging between 123-144/69-77.  Her heart rate is generally between 68 and 75.  Her blood sugars are slightly elevated.  She checks her fasting blood sugar every morning and her fasting blood sugar ranges between 91 and 117.  There are no episodes of hypoglycemia.  She denies any polyuria, polydipsia, or blurry vision.  She denies any neuropathy in her feet.  She denies any diffuse myalgias.  She does occasionally have knee pain secondary to arthritis and some mild low back pain but otherwise denies any significant pain.   Past Medical History:  Diagnosis Date  . Arthritis    rheumatoid arthritis  . Brain tumor ([name])   . Chest pain   . Colon polyps   .  Dyspepsia   . Gallbladder sludge   . Hypercholesterolemia   . Hypertension   . Left atrial dilatation    Mild  . Mild aortic sclerosis ([facility])    without stenosis  . Mitral annular calcification    Mild  . Mitral valve disorder    Mildly thickened mitral valve, but with normal leaflet but with normal leaflet   . 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 lt eye implant  1998  . rt knee surg  1991  . TUBAL LIGATION  1981   Current Outpatient Medications on File Prior to Visit  Medication Sig Dispense Refill  . Accu-Chek Softclix  Lancets lancets CHECK BLOOD SUGAR EVERY MORNING 100 each 3  . amLODipine (NORVASC) 10 MG tablet TAKE 1 TABLET EVERY DAY 90 tablet 3  . Ascorbic Acid (VITAMIN C) 1000 MG tablet Take 1,000 mg by mouth daily.      Marland Kitchen aspirin 81 MG chewable tablet Chew 81 mg by mouth daily.      Marland Kitchen atorvastatin (LIPITOR) 80 MG tablet TAKE 1/2 TABLET EVERY DAY 45 tablet 3  . Blood Glucose Monitoring Suppl (ACCU-CHEK AVIVA PLUS) w/Device KIT USE AS DIRECTED TO CHECK BLOOD SUGAR 1 kit 0  . Cholecalciferol (VITAMIN D) 2000 UNITS tablet Take 1,000 Units by mouth daily.     . Coenzyme Q10 (CO Q-10) 100 MG CAPS Take by mouth.    . doxazosin (CARDURA) 1 MG tablet Take 1 tablet (1 mg total) by mouth daily. 90 tablet 3  . fenofibrate 54 MG tablet TAKE 1 TABLET EVERY DAY 90 tablet 3  . ferrous sulfate 325 (65 FE) MG EC tablet Take 325 mg by mouth 3 (three) times daily with meals.    Marland Kitchen glucose blood (ACCU-CHEK AVIVA PLUS) test strip CHECK DAILY 100 each 3  . Lancet Devices (ACCU-CHEK SOFTCLIX) lancets Check BS qd - DX- E11.9 1 each 5  . losartan-hydrochlorothiazide (HYZAAR) 100-25 MG tablet Take 1 tablet by mouth daily. 90 tablet 3  . metoprolol succinate (TOPROL-XL) 50 MG 24 hr tablet TAKE 1 TABLET EVERY DAY. TAKE WITH OR IMMEDIATELY FOLLOWING A MEAL. 90 tablet 3  . naproxen sodium (ALEVE) 220 MG tablet Take 220 mg by mouth daily as needed (knee pain).     . Omega-3 Fatty Acids (FISH OIL CONCENTRATE PO) Take by mouth.    [name] Glycol-Propyl Glycol (SYSTANE) 0.4-0.3 % SOLN Apply to eye.    . polyethylene glycol (MIRALAX / GLYCOLAX) packet Take 17 g by mouth daily.    . Probiotic Product (PROBIOTIC DAILY PO) Take by mouth.    . pyridOXINE (VITAMIN B-6) 100 MG tablet Take 100 mg by mouth daily as needed.      No current facility-administered medications on file prior to visit.    Allergies  Allergen Reactions  . Vytorin [Ezetimibe-Simvastatin] Other (See Comments)    myalgia   Social History   Socioeconomic History   . Marital status: Widowed    Spouse name: Not on file  . Number of children: Not on file  . Years of education: Not on file  . Highest education level: Not on file  Occupational History  . Not on file  Social Needs  . Financial resource strain: Not on file  . Food insecurity    Worry: Not on file    Inability: Not on file  . Transportation needs  Medical: Not on file    Non-medical: Not on file  Tobacco Use  . Smoking status: Former Smoker    Quit date: 09/07/1973    Years since quitting: 45.2  . Smokeless tobacco: Never Used  Substance and Sexual Activity  . Alcohol use: No  . Drug use: No  . Sexual activity: Not Currently  Lifestyle  . Physical activity    Days per week: Not on file    Minutes per session: Not on file  . Stress: Not on file  Relationships  . Social Herbalist on phone: Not on file    Gets together: Not on file    Attends religious service: Not on file    Active member of club or organization: Not on file    Attends meetings of clubs or organizations: Not on file    Relationship status: Not on file  . Intimate partner violence    Fear of current or ex partner: Not on file    Emotionally abused: Not on file    Physically abused: Not on file    Forced sexual activity: Not on file  Other Topics Concern  . Not on file  Social History Narrative  . Not on file      Review of Systems  All other systems reviewed and are negative.      Objective:   Physical Exam  Constitutional: She appears well-developed and well-nourished. No distress.  HENT:  Head: Normocephalic and atraumatic.  Neck: Neck supple. No JVD present. No thyromegaly present.  Cardiovascular: Normal rate, regular rhythm, normal heart sounds and intact distal pulses.  No murmur heard. Pulmonary/Chest: Effort normal and breath sounds normal. No respiratory distress. She has no wheezes. She has no rales.  Abdominal: Soft. Bowel sounds are normal. She exhibits no  distension. There is no abdominal tenderness. There is no rebound.  Lymphadenopathy:    She has no cervical adenopathy.  Skin: She is not diaphoretic.  Vitals reviewed.         Assessment & Plan:  The primary encounter diagnosis was Essential hypertension. Diagnoses of Pure hypercholesterolemia and Prediabetes were also pertinent to this visit. Blood pressure here is elevated however I believe this is fictitious and due to whitecoat syndrome.  Blood pressure is checked regularly at home and is outstanding.  Therefore I would not change any of her antihypertensives.  She denies any further episodes of angina so I feel no further work-up is necessary at this time.  I will check a CBC, CMP, and fasting lipid panel.  Ideally I like her LDL cholesterol less than 100 and is close to 70 as possible.  I will check a hemoglobin A1c however her fasting blood sugars are well within the normal range and did not indicate progression to type 2 diabetes at this time.

## 2018-11-20 LAB — COMPLETE METABOLIC PANEL WITH GFR
AG Ratio: 1.3 (calc) (ref 1.0–2.5)
ALT: 17 U/L (ref 6–29)
AST: 20 U/L (ref 10–35)
Albumin: 4.3 g/dL (ref 3.6–5.1)
Alkaline phosphatase (APISO): 43 U/L (ref 37–153)
BUN: 23 mg/dL (ref 7–25)
CO2: 27 mmol/L (ref 20–32)
Calcium: 10.2 mg/dL (ref 8.6–10.4)
Chloride: 105 mmol/L (ref 98–110)
Creat: 0.88 mg/dL (ref 0.60–0.93)
GFR, Est African American: 73 mL/min/{1.73_m2} (ref >=60)
GFR, Est Non African American: 63 mL/min/{1.73_m2} (ref >=60)
Globulin: 3.4 g/dL (calc) (ref 1.9–3.7)
Glucose, Bld: 103 mg/dL — ABNORMAL HIGH (ref 65–99)
Potassium: 3.9 mmol/L (ref 3.5–5.3)
Sodium: 140 mmol/L (ref 135–146)
Total Bilirubin: 0.3 mg/dL (ref 0.2–1.2)
Total Protein: 7.7 g/dL (ref 6.1–8.1)

## 2018-11-20 LAB — LIPID PANEL
Cholesterol: 186 mg/dL (ref <200)
HDL: 60 mg/dL (ref >=50)
LDL Cholesterol (Calc): 110 mg/dL (calc) — ABNORMAL HIGH
Non-HDL Cholesterol (Calc): 126 mg/dL (calc) (ref <130)
Total CHOL/HDL Ratio: 3.1 (calc) (ref <5.0)
Triglycerides: 69 mg/dL (ref <150)

## 2018-11-20 LAB — HEMOGLOBIN A1C
Hgb A1c MFr Bld: 6.1 % of total Hgb — ABNORMAL HIGH (ref <5.7)
Mean Plasma Glucose: 128 (calc)
eAG (mmol/L): 7.1 (calc)

## 2018-11-20 LAB — CBC WITH DIFFERENTIAL/PLATELET
Absolute Monocytes: 616 cells/uL (ref 200–950)
Basophils Absolute: 42 cells/uL (ref 0–200)
Basophils Relative: 0.6 %
Eosinophils Absolute: 217 cells/uL (ref 15–500)
Eosinophils Relative: 3.1 %
HCT: 38.2 % (ref 35.0–45.0)
Hemoglobin: 12.4 g/dL (ref 11.7–15.5)
Lymphs Abs: 2947 cells/uL (ref 850–3900)
MCH: 25.8 pg — ABNORMAL LOW (ref 27.0–33.0)
MCHC: 32.5 g/dL (ref 32.0–36.0)
MCV: 79.6 fL — ABNORMAL LOW (ref 80.0–100.0)
MPV: 10.2 fL (ref 7.5–12.5)
Monocytes Relative: 8.8 %
Neutro Abs: 3178 cells/uL (ref 1500–7800)
Neutrophils Relative %: 45.4 %
Platelets: 313 10*3/uL (ref 140–400)
RBC: 4.8 10*6/uL (ref 3.80–5.10)
RDW: 15.3 % — ABNORMAL HIGH (ref 11.0–15.0)
Total Lymphocyte: 42.1 %
WBC: 7 10*3/uL (ref 3.8–10.8)

## 2018-11-21 ENCOUNTER — Encounter: Payer: Self-pay | Admitting: Family Medicine

## 2018-11-29 ENCOUNTER — Other Ambulatory Visit: Payer: Self-pay | Admitting: Family Medicine

## 2019-01-09 ENCOUNTER — Other Ambulatory Visit: Payer: Self-pay | Admitting: Family Medicine

## 2019-01-17 DIAGNOSIS — H25091 Other age-related incipient cataract, right eye: Secondary | ICD-10-CM | POA: Diagnosis not present

## 2019-01-17 DIAGNOSIS — H52222 Regular astigmatism, left eye: Secondary | ICD-10-CM | POA: Diagnosis not present

## 2019-01-17 DIAGNOSIS — H5201 Hypermetropia, right eye: Secondary | ICD-10-CM | POA: Diagnosis not present

## 2019-01-17 DIAGNOSIS — H524 Presbyopia: Secondary | ICD-10-CM | POA: Diagnosis not present

## 2019-01-17 DIAGNOSIS — H4720 Unspecified optic atrophy: Secondary | ICD-10-CM | POA: Diagnosis not present

## 2019-01-17 DIAGNOSIS — E119 Type 2 diabetes mellitus without complications: Secondary | ICD-10-CM | POA: Diagnosis not present

## 2019-01-17 DIAGNOSIS — Z961 Presence of intraocular lens: Secondary | ICD-10-CM | POA: Diagnosis not present

## 2019-01-17 LAB — HM DIABETES EYE EXAM

## 2019-01-31 ENCOUNTER — Other Ambulatory Visit: Payer: Self-pay

## 2019-01-31 ENCOUNTER — Ambulatory Visit (INDEPENDENT_AMBULATORY_CARE_PROVIDER_SITE_OTHER): Payer: Medicare HMO | Admitting: Family Medicine

## 2019-01-31 ENCOUNTER — Encounter: Payer: Self-pay | Admitting: Family Medicine

## 2019-01-31 VITALS — BP 182/74 | HR 86 | Temp 97.6°F | Resp 18 | Ht 64.0 in | Wt 260.0 lb

## 2019-01-31 DIAGNOSIS — Z23 Encounter for immunization: Secondary | ICD-10-CM

## 2019-01-31 DIAGNOSIS — R8290 Unspecified abnormal findings in urine: Secondary | ICD-10-CM

## 2019-01-31 DIAGNOSIS — I10 Essential (primary) hypertension: Secondary | ICD-10-CM | POA: Diagnosis not present

## 2019-01-31 NOTE — Progress Notes (Signed)
Subjective:    Patient ID: [name], female    DOB: 16-Jun-1940, 78 y.o.   MRN: [id number] Patient states that recently she has noticed that her urine has an unusual odor.  She states that it smells like medicine.  She denies any dysuria.  She denies any frequency.  She denies any urgency.  She denies any hesitancy.  She denies any hematuria.  She denies any change in color.  It is not cloudy or purulent in appearance.  She denies any abdominal pain.  She denies any nausea or vomiting.  She denies any low back pain.  She denies any recent change in her medication or change in her diet  Past Medical History:  Diagnosis Date  . Arthritis    rheumatoid arthritis  . Brain tumor ([place])   . Chest pain   . Colon polyps   . Dyspepsia   . Gallbladder sludge   . Hypercholesterolemia   . Hypertension   . Left atrial dilatation    Mild  . Mild aortic sclerosis ([facility])    without stenosis  . Mitral annular calcification    Mild  . Mitral valve disorder    Mildly thickened mitral valve, but with normal leaflet but with normal leaflet   . 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 lt eye implant  1998  . rt knee surg  1991  . TUBAL LIGATION  1981   Current Outpatient Medications on File Prior to Visit  Medication Sig Dispense Refill  . ACCU-CHEK AVIVA PLUS test strip TEST BLOOD SUGAR EVERY DAY 100 strip 3  . Accu-Chek Softclix Lancets lancets CHECK BLOOD SUGAR EVERY MORNING 100 each 3  . amLODipine (NORVASC) 10 MG tablet TAKE 1 TABLET EVERY DAY 90 tablet 3  . Ascorbic Acid (VITAMIN C) 1000 MG tablet Take 1,000 mg by mouth daily.      Marland Kitchen aspirin 81 MG chewable tablet Chew 81 mg by mouth daily.      Marland Kitchen atorvastatin (LIPITOR) 80 MG tablet TAKE 1/2 TABLET EVERY DAY 45 tablet 3  . Blood Glucose Monitoring Suppl (ACCU-CHEK AVIVA PLUS) w/Device KIT USE AS DIRECTED TO CHECK BLOOD SUGAR 1 kit 0  .  Cholecalciferol (VITAMIN D) 2000 UNITS tablet Take 1,000 Units by mouth daily.     . Coenzyme Q10 (CO Q-10) 100 MG CAPS Take by mouth.    . doxazosin (CARDURA) 1 MG tablet Take 1 tablet (1 mg total) by mouth daily. 90 tablet 3  . fenofibrate 54 MG tablet TAKE 1 TABLET EVERY DAY 90 tablet 3  . ferrous sulfate 325 (65 FE) MG EC tablet Take 325 mg by mouth 3 (three) times daily with meals.    Elmore Guise Devices (ACCU-CHEK SOFTCLIX) lancets Check BS qd - DX- E11.9 1 each 5  . losartan-hydrochlorothiazide (HYZAAR) 100-25 MG tablet TAKE 1 TABLET EVERY DAY 90 tablet 3  . metoprolol succinate (TOPROL-XL) 50 MG 24 hr tablet TAKE 1 TABLET EVERY DAY. TAKE WITH OR IMMEDIATELY FOLLOWING A MEAL. 90 tablet 3  . naproxen sodium (ALEVE) 220 MG tablet Take 220 mg by mouth daily as needed (knee pain).     . Omega-3 Fatty Acids (FISH OIL CONCENTRATE PO) Take by mouth.    Vladimir Faster Glycol-Propyl Glycol (SYSTANE) 0.4-0.3 % SOLN Apply to eye.    . polyethylene glycol (MIRALAX / GLYCOLAX) packet Take 17 g  by mouth daily.    . Probiotic Product (PROBIOTIC DAILY PO) Take by mouth.    . pyridOXINE (VITAMIN B-6) 100 MG tablet Take 100 mg by mouth daily as needed.      No current facility-administered medications on file prior to visit.    Allergies  Allergen Reactions  . Vytorin [Ezetimibe-Simvastatin] Other (See Comments)    myalgia   Social History   Socioeconomic History  . Marital status: Widowed    Spouse name: Not on file  . Number of children: Not on file  . Years of education: Not on file  . Highest education level: Not on file  Occupational History  . Not on file  Social Needs  . Financial resource strain: Not on file  . Food insecurity    Worry: Not on file    Inability: Not on file  . Transportation needs    Medical: Not on file    Non-medical: Not on file  Tobacco Use  . Smoking status: Former Smoker    Quit date: 09/07/1973    Years since quitting: 45.4  . Smokeless tobacco: Never Used   Substance and Sexual Activity  . Alcohol use: No  . Drug use: No  . Sexual activity: Not Currently  Lifestyle  . Physical activity    Days per week: Not on file    Minutes per session: Not on file  . Stress: Not on file  Relationships  . Social Herbalist on phone: Not on file    Gets together: Not on file    Attends religious service: Not on file    Active member of club or organization: Not on file    Attends meetings of clubs or organizations: Not on file    Relationship status: Not on file  . Intimate partner violence    Fear of current or ex partner: Not on file    Emotionally abused: Not on file    Physically abused: Not on file    Forced sexual activity: Not on file  Other Topics Concern  . Not on file  Social History Narrative  . Not on file      Review of Systems  All other systems reviewed and are negative.      Objective:   Physical Exam  Constitutional: She appears well-developed and well-nourished. No distress.  HENT:  Head: Normocephalic and atraumatic.  Neck: Neck supple. No JVD present. No thyromegaly present.  Cardiovascular: Normal rate, regular rhythm, normal heart sounds and intact distal pulses.  No murmur heard. Pulmonary/Chest: Effort normal and breath sounds normal. No respiratory distress. She has no wheezes. She has no rales.  Abdominal: Soft. Bowel sounds are normal. She exhibits no distension. There is no abdominal tenderness. There is no rebound.  Lymphadenopathy:    She has no cervical adenopathy.  Skin: She is not diaphoretic.  Vitals reviewed.         Assessment & Plan:  Abnormal urine odor  Essential hypertension  Patient is completely asymptomatic.  I requested a urinalysis but she states that she is unable to give [place] a urine sample today.  Given the lack of symptoms, I suspect that this is nothing to be concerned by.  I recommended that the patient come by at her earliest convenience and give [place] a urine sample  so that we can rule out a urinary tract infection however at the present time I will simply monitor this clinically for any symptom development.  I am  more concerned that her blood pressure so high today.  However she states that she has been rushing around and that she knew it was good to be high.  She states that she checks it every day at home and is always in the 130s to 140s over 70s to 80s.  She feels very nervous today and anxious and was rushing to get here and therefore she believes the blood pressure is inaccurate.  She states that she will check it again as soon as she gets home and call me back with the value.

## 2019-01-31 NOTE — Addendum Note (Signed)
Addended by: Shary Decamp B on: 01/31/2019 01:51 PM   Modules accepted: Orders

## 2019-02-25 ENCOUNTER — Other Ambulatory Visit (HOSPITAL_COMMUNITY): Payer: Self-pay | Admitting: Family Medicine

## 2019-02-25 DIAGNOSIS — Z1231 Encounter for screening mammogram for malignant neoplasm of breast: Secondary | ICD-10-CM

## 2019-04-07 ENCOUNTER — Other Ambulatory Visit: Payer: Self-pay

## 2019-04-07 ENCOUNTER — Ambulatory Visit (HOSPITAL_COMMUNITY)
Admission: RE | Admit: 2019-04-07 | Discharge: 2019-04-07 | Disposition: A | Discharge status: Home / Self Care | Payer: Medicare HMO | Source: Ambulatory Visit | Attending: Family Medicine | Admitting: Family Medicine

## 2019-04-07 DIAGNOSIS — Z1231 Encounter for screening mammogram for malignant neoplasm of breast: Secondary | ICD-10-CM | POA: Insufficient documentation

## 2019-05-10 ENCOUNTER — Other Ambulatory Visit: Payer: Self-pay | Admitting: Family Medicine

## 2019-05-30 ENCOUNTER — Ambulatory Visit (INDEPENDENT_AMBULATORY_CARE_PROVIDER_SITE_OTHER): Payer: Medicare HMO | Admitting: Family Medicine

## 2019-05-30 ENCOUNTER — Other Ambulatory Visit: Payer: Self-pay

## 2019-05-30 ENCOUNTER — Encounter: Payer: Self-pay | Admitting: Family Medicine

## 2019-05-30 VITALS — BP 180/82 | HR 84 | Temp 97.6°F | Resp 18 | Ht 64.0 in | Wt 257.0 lb

## 2019-05-30 DIAGNOSIS — M069 Rheumatoid arthritis, unspecified: Secondary | ICD-10-CM | POA: Diagnosis not present

## 2019-05-30 DIAGNOSIS — R8290 Unspecified abnormal findings in urine: Secondary | ICD-10-CM | POA: Diagnosis not present

## 2019-05-30 LAB — URINALYSIS, ROUTINE W REFLEX MICROSCOPIC
Bilirubin Urine: NEGATIVE
Glucose, UA: NEGATIVE
Hgb urine dipstick: NEGATIVE
Hyaline Cast: NONE SEEN /LPF
Ketones, ur: NEGATIVE
Nitrite: POSITIVE — AB
Protein, ur: NEGATIVE
RBC / HPF: NONE SEEN /HPF (ref 0–2)
Specific Gravity, Urine: 1.025 (ref 1.001–1.03)
pH: 5.5 (ref 5.0–8.0)

## 2019-05-30 LAB — MICROSCOPIC MESSAGE

## 2019-05-30 MED ORDER — SULFAMETHOXAZOLE-TRIMETHOPRIM 800-160 MG PO TABS: 1.0000 | ORAL_TABLET | Freq: Two times a day (BID) | ORAL | 0 refills | Status: DC

## 2019-05-30 NOTE — Progress Notes (Signed)
Subjective:    Patient ID: [name], female    DOB: 1940/12/02, 79 y.o.   MRN: [id number]  HPI Patient presents today reporting a foul-smelling odor to her urine and increased frequency along with some mild dysuria.  Symptoms have been present for a few days.  Urinalysis today shows positive nitrites and +1 leukocyte esterase.  Patient denies any fever or back pain or nausea or vomiting.  However her blood pressure today is extremely high at 180/82.  She is extremely nervous today.  She is very much afraid of COVID-19 and being out of the house and in the doctor's office has her extremely anxious.  She also has not taken her blood pressure medicine yet. Past Medical History:  Diagnosis Date  . Arthritis    rheumatoid arthritis  . Brain tumor ([place])   . Chest pain   . Colon polyps   . Dyspepsia   . Gallbladder sludge   . Hypercholesterolemia   . Hypertension   . Left atrial dilatation    Mild  . Mild aortic sclerosis    without stenosis  . Mitral annular calcification    Mild  . Mitral valve disorder    Mildly thickened mitral valve, but with normal leaflet but with normal leaflet   . 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 lt eye implant  1998  . rt knee surg  1991  . TUBAL LIGATION  1981   Current Outpatient Medications on File Prior to Visit  Medication Sig Dispense Refill  . ACCU-CHEK AVIVA PLUS test strip TEST BLOOD SUGAR EVERY DAY 100 strip 3  . Accu-Chek Softclix Lancets lancets CHECK BLOOD SUGAR EVERY MORNING 100 each 3  . amLODipine (NORVASC) 10 MG tablet TAKE 1 TABLET EVERY DAY 90 tablet 0  . Ascorbic Acid (VITAMIN C) 1000 MG tablet Take 1,000 mg by mouth daily.      Marland Kitchen aspirin 81 MG chewable tablet Chew 81 mg by mouth daily.      Marland Kitchen atorvastatin (LIPITOR) 80 MG tablet TAKE 1/2 TABLET EVERY DAY 45 tablet 3  . Blood Glucose Monitoring Suppl (ACCU-CHEK AVIVA PLUS)  w/Device KIT USE AS DIRECTED TO CHECK BLOOD SUGAR 1 kit 0  . Cholecalciferol (VITAMIN D) 2000 UNITS tablet Take 1,000 Units by mouth daily.     . Coenzyme Q10 (CO Q-10) 100 MG CAPS Take by mouth.    . doxazosin (CARDURA) 1 MG tablet Take 1 tablet (1 mg total) by mouth daily. 90 tablet 3  . fenofibrate 54 MG tablet TAKE 1 TABLET EVERY DAY 90 tablet 3  . ferrous sulfate 325 (65 FE) MG EC tablet Take 325 mg by mouth 3 (three) times daily with meals.    Elmore Guise Devices (ACCU-CHEK SOFTCLIX) lancets Check BS qd - DX- E11.9 1 each 5  . losartan-hydrochlorothiazide (HYZAAR) 100-25 MG tablet TAKE 1 TABLET EVERY DAY 90 tablet 3  . metoprolol succinate (TOPROL-XL) 50 MG 24 hr tablet TAKE 1 TABLET EVERY DAY. TAKE WITH OR IMMEDIATELY FOLLOWING A MEAL. 90 tablet 0  . naproxen sodium (ALEVE) 220 MG tablet Take 220 mg by mouth daily as needed (knee pain).     . Omega-3 Fatty Acids (FISH OIL CONCENTRATE PO) Take by mouth.    Vladimir Faster Glycol-Propyl Glycol (SYSTANE) 0.4-0.3 % SOLN Apply to eye.    . polyethylene glycol (MIRALAX / GLYCOLAX) packet  Take 17 g by mouth daily.    . Probiotic Product (PROBIOTIC DAILY PO) Take by mouth.    . pyridOXINE (VITAMIN B-6) 100 MG tablet Take 100 mg by mouth daily as needed.      No current facility-administered medications on file prior to visit.   Allergies  Allergen Reactions  . Vytorin [Ezetimibe-Simvastatin] Other (See Comments)    myalgia   Social History   Socioeconomic History  . Marital status: Widowed    Spouse name: Not on file  . Number of children: Not on file  . Years of education: Not on file  . Highest education level: Not on file  Occupational History  . Not on file  Tobacco Use  . Smoking status: Former Smoker    Quit date: 09/07/1973    Years since quitting: 45.7  . Smokeless tobacco: Never Used  Substance and Sexual Activity  . Alcohol use: No  . Drug use: No  . Sexual activity: Not Currently  Other Topics Concern  . Not on file   Social History Narrative  . Not on file   Social Determinants of Health   Financial Resource Strain:   . Difficulty of Paying Living Expenses: Not on file  Food Insecurity:   . Worried About Charity fundraiser in the Last Year: Not on file  . Ran Out of Food in the Last Year: Not on file  Transportation Needs:   . Lack of Transportation (Medical): Not on file  . Lack of Transportation (Non-Medical): Not on file  Physical Activity:   . Days of Exercise per Week: Not on file  . Minutes of Exercise per Session: Not on file  Stress:   . Feeling of Stress : Not on file  Social Connections:   . Frequency of Communication with Friends and Family: Not on file  . Frequency of Social Gatherings with Friends and Family: Not on file  . Attends Religious Services: Not on file  . Active Member of Clubs or Organizations: Not on file  . Attends Archivist Meetings: Not on file  . Marital Status: Not on file  Intimate Partner Violence:   . Fear of Current or Ex-Partner: Not on file  . Emotionally Abused: Not on file  . Physically Abused: Not on file  . Sexually Abused: Not on file      Review of Systems  All other systems reviewed and are negative.      Objective:   Physical Exam Constitutional:      Appearance: Normal appearance.  Cardiovascular:     Rate and Rhythm: Normal rate and regular rhythm.     Pulses: Normal pulses.     Heart sounds: Normal heart sounds.  Pulmonary:     Effort: Pulmonary effort is normal. No respiratory distress.     Breath sounds: Normal breath sounds. No wheezing, rhonchi or rales.  Neurological:     Mental Status: She is alert.           Assessment & Plan:  Abnormal urine odor - Plan: Urinalysis, Routine w reflex microscopic  Urinalysis suggest urinary tract infection.  Begin Bactrim double strength tablets 1 pill twice a day for 5 days.  Push fluids.  I am more concerned about her elevated blood pressure.  I have recommended  patient go home and try to relax.  Check her blood pressure later this afternoon after she is taking her medication.  If her blood pressure still extremely high she can take an  extra doxazosin however I suspect the majority of this is anxiety due to being in the doctor's office.

## 2019-06-04 ENCOUNTER — Other Ambulatory Visit: Payer: Self-pay | Admitting: Family Medicine

## 2019-06-19 ENCOUNTER — Other Ambulatory Visit: Payer: Medicare HMO

## 2019-06-19 ENCOUNTER — Other Ambulatory Visit: Payer: Self-pay

## 2019-06-19 DIAGNOSIS — E7800 Pure hypercholesterolemia, unspecified: Secondary | ICD-10-CM | POA: Diagnosis not present

## 2019-06-19 DIAGNOSIS — R7303 Prediabetes: Secondary | ICD-10-CM | POA: Diagnosis not present

## 2019-06-19 DIAGNOSIS — I10 Essential (primary) hypertension: Secondary | ICD-10-CM

## 2019-06-20 LAB — HEMOGLOBIN A1C
Hgb A1c MFr Bld: 6.1 % of total Hgb — ABNORMAL HIGH (ref <5.7)
Mean Plasma Glucose: 128 (calc)
eAG (mmol/L): 7.1 (calc)

## 2019-06-20 LAB — CBC WITH DIFFERENTIAL/PLATELET
Absolute Monocytes: 507 cells/uL (ref 200–950)
Basophils Absolute: 18 cells/uL (ref 0–200)
Basophils Relative: 0.3 %
Eosinophils Absolute: 159 cells/uL (ref 15–500)
Eosinophils Relative: 2.7 %
HCT: 36.9 % (ref 35.0–45.0)
Hemoglobin: 12.2 g/dL (ref 11.7–15.5)
Lymphs Abs: 2779 cells/uL (ref 850–3900)
MCH: 26.5 pg — ABNORMAL LOW (ref 27.0–33.0)
MCHC: 33.1 g/dL (ref 32.0–36.0)
MCV: 80.2 fL (ref 80.0–100.0)
MPV: 10.5 fL (ref 7.5–12.5)
Monocytes Relative: 8.6 %
Neutro Abs: 2437 cells/uL (ref 1500–7800)
Neutrophils Relative %: 41.3 %
Platelets: 279 10*3/uL (ref 140–400)
RBC: 4.6 10*6/uL (ref 3.80–5.10)
RDW: 14.6 % (ref 11.0–15.0)
Total Lymphocyte: 47.1 %
WBC: 5.9 10*3/uL (ref 3.8–10.8)

## 2019-06-20 LAB — LIPID PANEL
Cholesterol: 175 mg/dL (ref <200)
HDL: 58 mg/dL (ref >=50)
LDL Cholesterol (Calc): 103 mg/dL (calc) — ABNORMAL HIGH
Non-HDL Cholesterol (Calc): 117 mg/dL (calc) (ref <130)
Total CHOL/HDL Ratio: 3 (calc) (ref <5.0)
Triglycerides: 57 mg/dL (ref <150)

## 2019-06-20 LAB — COMPREHENSIVE METABOLIC PANEL
AG Ratio: 1.4 (calc) (ref 1.0–2.5)
ALT: 18 U/L (ref 6–29)
AST: 19 U/L (ref 10–35)
Albumin: 4.2 g/dL (ref 3.6–5.1)
Alkaline phosphatase (APISO): 36 U/L — ABNORMAL LOW (ref 37–153)
BUN: 14 mg/dL (ref 7–25)
CO2: 28 mmol/L (ref 20–32)
Calcium: 10.2 mg/dL (ref 8.6–10.4)
Chloride: 105 mmol/L (ref 98–110)
Creat: 0.84 mg/dL (ref 0.60–0.93)
Globulin: 3 g/dL (calc) (ref 1.9–3.7)
Glucose, Bld: 106 mg/dL — ABNORMAL HIGH (ref 65–99)
Potassium: 3.8 mmol/L (ref 3.5–5.3)
Sodium: 142 mmol/L (ref 135–146)
Total Bilirubin: 0.5 mg/dL (ref 0.2–1.2)
Total Protein: 7.2 g/dL (ref 6.1–8.1)

## 2019-07-08 ENCOUNTER — Other Ambulatory Visit: Payer: Self-pay

## 2019-07-08 ENCOUNTER — Ambulatory Visit (INDEPENDENT_AMBULATORY_CARE_PROVIDER_SITE_OTHER): Payer: Medicare HMO | Admitting: Family Medicine

## 2019-07-08 ENCOUNTER — Encounter: Payer: Self-pay | Admitting: Family Medicine

## 2019-07-08 VITALS — BP 172/80 | HR 86 | Temp 97.3°F | Resp 18 | Ht 64.0 in | Wt 255.0 lb

## 2019-07-08 DIAGNOSIS — K5909 Other constipation: Secondary | ICD-10-CM

## 2019-07-08 NOTE — Progress Notes (Signed)
Subjective:    Patient ID: [name], female    DOB: 12-Nov-1940, 79 y.o.   MRN: [id number] Patient's blood pressure is extremely high today however the patient is extremely tearful.  Her husband passed away approximately 20 years ago and she is approaching the anniversary of that.  This has her feeling very upset today.  The COVID-19 pandemic has her feeling extremely lonely and isolated.  All of it became too much this morning and she is extremely tearful and crying.  I believe this [name] be artificially elevating her blood pressure.  We discussed this at length and I have asked the patient to monitor her blood pressure at home which she does almost on a daily basis.  I would like her to report the values to me in a week.  If she is consistently in the low 140s over 80s I would not make any changes in her blood pressure medication.  Patient does occasionally have elevations in her blood pressure due to anxiety and being upset.  However she also reports abdominal discomfort.  She reports increased bloating.  She reports increased indigestion.  She will have sharp chest pains that improved after she burps.  The pains occur suddenly and without warning and resolve immediately upon burping.  She states that she is only having a bowel movement 1-2 times per week and it is not a complete bowel movement.  She does not feel like she is emptying fully.  She believes this is contributing to her bloating.  She also reports increased indigestion and gas in her stomach.  She denies any angina or shortness of breath or dyspnea on exertion  Past Medical History:  Diagnosis Date  . Arthritis    rheumatoid arthritis  . Brain tumor ([place])   . Chest pain   . Colon polyps   . Dyspepsia   . Gallbladder sludge   . Hypercholesterolemia   . Hypertension   . Left atrial dilatation    Mild  . Mild aortic sclerosis    without stenosis  . Mitral annular calcification    Mild  . Mitral valve disorder    Mildly  thickened mitral valve, but with normal leaflet but with normal leaflet   . 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   Past Surgical History:  Procedure Laterality Date  . lt eye implant  1998  . rt knee surg  1991  . TUBAL LIGATION  1981   Current Outpatient Medications on File Prior to Visit  Medication Sig Dispense Refill  . ACCU-CHEK AVIVA PLUS test strip TEST BLOOD SUGAR EVERY DAY 100 strip 3  . Accu-Chek Softclix Lancets lancets CHECK BLOOD SUGAR EVERY MORNING 100 each 3  . amLODipine (NORVASC) 10 MG tablet TAKE 1 TABLET EVERY DAY 90 tablet 0  . Ascorbic Acid (VITAMIN C) 1000 MG tablet Take 1,000 mg by mouth daily.      Marland Kitchen aspirin 81 MG chewable tablet Chew 81 mg by mouth daily.      Marland Kitchen atorvastatin (LIPITOR) 80 MG tablet TAKE 1/2 TABLET EVERY DAY 45 tablet 3  . Blood Glucose Monitoring Suppl (ACCU-CHEK AVIVA PLUS) w/Device KIT USE AS DIRECTED TO CHECK BLOOD SUGAR 1 kit 0  . Cholecalciferol (VITAMIN D) 2000 UNITS tablet Take 1,000 Units by mouth daily.     . Coenzyme Q10 (CO Q-10) 100 MG CAPS Take by mouth.    . doxazosin (CARDURA) 1 MG tablet  Take 1 tablet (1 mg total) by mouth daily. 90 tablet 3  . fenofibrate 54 MG tablet TAKE 1 TABLET EVERY DAY 90 tablet 3  . ferrous sulfate 325 (65 FE) MG EC tablet Take 325 mg by mouth 3 (three) times daily with meals.    Elmore Guise Devices (ACCU-CHEK SOFTCLIX) lancets Check BS qd - DX- E11.9 1 each 5  . losartan-hydrochlorothiazide (HYZAAR) 100-25 MG tablet TAKE 1 TABLET EVERY DAY 90 tablet 3  . metoprolol succinate (TOPROL-XL) 50 MG 24 hr tablet TAKE 1 TABLET EVERY DAY. TAKE WITH OR IMMEDIATELY FOLLOWING A MEAL. 90 tablet 0  . naproxen sodium (ALEVE) 220 MG tablet Take 220 mg by mouth daily as needed (knee pain).     . Omega-3 Fatty Acids (FISH OIL CONCENTRATE PO) Take by mouth.    Vladimir Faster Glycol-Propyl Glycol (SYSTANE) 0.4-0.3 % SOLN Apply to eye.    . polyethylene glycol (MIRALAX / GLYCOLAX) packet Take 17  g by mouth daily.    . Probiotic Product (PROBIOTIC DAILY PO) Take by mouth.    . pyridOXINE (VITAMIN B-6) 100 MG tablet Take 100 mg by mouth daily as needed.      No current facility-administered medications on file prior to visit.   Allergies  Allergen Reactions  . Vytorin [Ezetimibe-Simvastatin] Other (See Comments)    myalgia   Social History   Socioeconomic History  . Marital status: Widowed    Spouse name: Not on file  . Number of children: Not on file  . Years of education: Not on file  . Highest education level: Not on file  Occupational History  . Not on file  Tobacco Use  . Smoking status: Former Smoker    Quit date: 09/07/1973    Years since quitting: 45.8  . Smokeless tobacco: Never Used  Substance and Sexual Activity  . Alcohol use: No  . Drug use: No  . Sexual activity: Not Currently  Other Topics Concern  . Not on file  Social History Narrative  . Not on file   Social Determinants of Health   Financial Resource Strain:   . Difficulty of Paying Living Expenses: Not on file  Food Insecurity:   . Worried About Charity fundraiser in the Last Year: Not on file  . Ran Out of Food in the Last Year: Not on file  Transportation Needs:   . Lack of Transportation (Medical): Not on file  . Lack of Transportation (Non-Medical): Not on file  Physical Activity:   . Days of Exercise per Week: Not on file  . Minutes of Exercise per Session: Not on file  Stress:   . Feeling of Stress : Not on file  Social Connections:   . Frequency of Communication with Friends and Family: Not on file  . Frequency of Social Gatherings with Friends and Family: Not on file  . Attends Religious Services: Not on file  . Active Member of Clubs or Organizations: Not on file  . Attends Archivist Meetings: Not on file  . Marital Status: Not on file  Intimate Partner Violence:   . Fear of Current or Ex-Partner: Not on file  . Emotionally Abused: Not on file  . Physically  Abused: Not on file  . Sexually Abused: Not on file      Review of Systems  All other systems reviewed and are negative.      Objective:   Physical Exam  Constitutional: She appears well-developed and well-nourished. No distress.  HENT:  Head: Normocephalic and atraumatic.  Neck: No JVD present. No thyromegaly present.  Cardiovascular: Normal rate, regular rhythm, normal heart sounds and intact distal pulses.  No murmur heard. Pulmonary/Chest: Effort normal and breath sounds normal. No respiratory distress. She has no wheezes. She has no rales.  Abdominal: Soft. Bowel sounds are normal. She exhibits no distension. There is no abdominal tenderness. There is no rebound.  Musculoskeletal:     Cervical back: Neck supple.  Lymphadenopathy:    She has no cervical adenopathy.  Skin: She is not diaphoretic.  Vitals reviewed.         Assessment & Plan:  The encounter diagnosis was Chronic constipation. I believe the patient's bloating and abdominal discomfort and burping could be due to chronic constipation.  I recommended trying Amitiza 8 mcg p.o. twice daily and I gave her sufficient samples to try this for 2 to 3 weeks to see if this helps with the bloating and constipation.  She is already taking MiraLAX on a daily basis as well as milk of magnesia occasionally.  I stated that she could add Amitiza to the MiraLAX without any difficulty.  If this does not work, we [name] need to add a stimulant laxative such as Dulcolax.  I am concerned about her blood pressure but she is also very upset today.  Therefore I have asked the patient to check her blood pressure twice a day for the next 3 to 4 days and notify me of the values prior to making any changes in her medication.  If so we can increase her doxazosin.  At the present time she is only taking 1 mg a day.

## 2019-07-14 ENCOUNTER — Other Ambulatory Visit: Payer: Self-pay | Admitting: Family Medicine

## 2019-07-31 ENCOUNTER — Other Ambulatory Visit: Payer: Self-pay | Admitting: Family Medicine

## 2019-08-28 ENCOUNTER — Other Ambulatory Visit: Payer: Self-pay

## 2019-08-28 ENCOUNTER — Ambulatory Visit: Payer: Medicare HMO | Admitting: Cardiology

## 2019-08-28 VITALS — BP 150/66 | HR 68 | Ht 64.0 in | Wt 257.0 lb

## 2019-08-28 DIAGNOSIS — I700 Atherosclerosis of aorta: Secondary | ICD-10-CM

## 2019-08-28 DIAGNOSIS — R0789 Other chest pain: Secondary | ICD-10-CM | POA: Diagnosis not present

## 2019-08-28 DIAGNOSIS — Z6841 Body Mass Index (BMI) 40.0 and over, adult: Secondary | ICD-10-CM | POA: Diagnosis not present

## 2019-08-28 NOTE — Patient Instructions (Signed)
Medication Instructions:  The current medical regimen is effective;  continue present plan and medications.  *If you need a refill on your cardiac medications before your next appointment, please call your pharmacy*  Follow-Up: At [facility], you and your health needs are our priority.  As part of our continuing mission to provide you with exceptional heart care, we have created designated Provider Care Teams.  These Care Teams include your primary Cardiologist (physician) and Advanced Practice Providers (APPs -  Physician Assistants and Nurse Practitioners) who all work together to provide you with the care you need, when you need it.  We recommend signing up for the patient portal called "MyChart".  Sign up information is provided on this After Visit Summary.  MyChart is used to connect with patients for Virtual Visits (Telemedicine).  Patients are able to view lab/test results, encounter notes, upcoming appointments, etc.  Non-urgent messages can be sent to your provider as well.   To learn more about what you can do with MyChart, go to [email or web address].    Your next appointment:   12 month(s)  The format for your next appointment:   In Person  Provider:   Mark Skains, MD   Thank you for choosing [facility]!!      

## 2019-08-28 NOTE — Progress Notes (Signed)
Cardiology Office Note:    Date:  08/28/2019   ID:  [name], DOB 1940-11-24, MRN [id number]  PCP:  Susy Frizzle, MD  Cardiologist:  Candee Furbish, MD  Electrophysiologist:  None   Referring MD: Susy Frizzle, MD     History of Present Illness:    [name] is a 79 y.o. female seen previously via telemedicine for chest pain angina here for follow-up.  Minimal chest pain sometimes when laying down at night, burps go away discomfort was mild to moderate. Fairly short-lived. She wondered at the time it was from eating too many pecans.  Overall has been doing fairly well.  Still tries to be active.  She is no longer mowing her lawn with a push mower but she used to enjoy this.  No prior heart issues in her mother and father. Non-smoker.  Past Medical History:  Diagnosis Date  . Arthritis    rheumatoid arthritis  . Brain tumor ([place])   . Chest pain   . Colon polyps   . Dyspepsia   . Gallbladder sludge   . Hypercholesterolemia   . Hypertension   . Left atrial dilatation    Mild  . Mild aortic sclerosis    without stenosis  . Mitral annular calcification    Mild  . Mitral valve disorder    Mildly thickened mitral valve, but with normal leaflet but with normal leaflet   . 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 lt eye implant  1998  . rt knee surg  1991  . TUBAL LIGATION  1981    Current Medications: Current Meds  Medication Sig  . ACCU-CHEK AVIVA PLUS test strip TEST BLOOD SUGAR EVERY DAY  . Accu-Chek Softclix Lancets lancets CHECK BLOOD SUGAR EVERY MORNING  . amLODipine (NORVASC) 10 MG tablet TAKE 1 TABLET EVERY DAY  . Ascorbic Acid (VITAMIN C) 1000 MG tablet Take 1,000 mg by mouth daily.    Marland Kitchen aspirin 81 MG chewable tablet Chew 81 mg by mouth daily.    Marland Kitchen atorvastatin (LIPITOR) 80 MG tablet TAKE 1/2 TABLET EVERY DAY  . Blood Glucose Monitoring Suppl  (ACCU-CHEK AVIVA PLUS) w/Device KIT USE AS DIRECTED TO CHECK BLOOD SUGAR  . Cholecalciferol (VITAMIN D) 2000 UNITS tablet Take 1,000 Units by mouth daily.   . Coenzyme Q10 (CO Q-10) 100 MG CAPS Take by mouth.  . doxazosin (CARDURA) 1 MG tablet TAKE 1 TABLET (1 MG TOTAL) BY MOUTH DAILY.  Marland Kitchen Lancet Devices (ACCU-CHEK SOFTCLIX) lancets Check BS qd - DX- E11.9  . losartan-hydrochlorothiazide (HYZAAR) 100-25 MG tablet TAKE 1 TABLET EVERY DAY  . metoprolol succinate (TOPROL-XL) 50 MG 24 hr tablet TAKE 1 TABLET EVERY DAY WITH OR IMMEDIATELY FOLLOWING A MEAL.  . naproxen sodium (ALEVE) 220 MG tablet Take 220 mg by mouth daily as needed (knee pain).   . Omega-3 Fatty Acids (FISH OIL CONCENTRATE PO) Take by mouth.  Vladimir Faster Glycol-Propyl Glycol (SYSTANE) 0.4-0.3 % SOLN Apply to eye.  . polyethylene glycol (MIRALAX / GLYCOLAX) packet Take 17 g by mouth daily.  Marland Kitchen pyridOXINE (VITAMIN B-6) 100 MG tablet Take 100 mg by mouth daily as needed.      Allergies:   Vytorin [ezetimibe-simvastatin]   Social History   Socioeconomic History  . Marital status: Widowed    Spouse name: Not on file  . Number of  children: Not on file  . Years of education: Not on file  . Highest education level: Not on file  Occupational History  . Not on file  Tobacco Use  . Smoking status: Former Smoker    Quit date: 09/07/1973    Years since quitting: 46.0  . Smokeless tobacco: Never Used  Substance and Sexual Activity  . Alcohol use: No  . Drug use: No  . Sexual activity: Not Currently  Other Topics Concern  . Not on file  Social History Narrative  . Not on file   Social Determinants of Health   Financial Resource Strain:   . Difficulty of Paying Living Expenses:   Food Insecurity:   . Worried About Charity fundraiser in the Last Year:   . Arboriculturist in the Last Year:   Transportation Needs:   . Film/video editor (Medical):   Marland Kitchen Lack of Transportation (Non-Medical):   Physical Activity:   . Days  of Exercise per Week:   . Minutes of Exercise per Session:   Stress:   . Feeling of Stress :   Social Connections:   . Frequency of Communication with Friends and Family:   . Frequency of Social Gatherings with Friends and Family:   . Attends Religious Services:   . Active Member of Clubs or Organizations:   . Attends Archivist Meetings:   Marland Kitchen Marital Status:      Family History: The patient's family history includes Diabetes in her grandchild, mother, and sister; Stroke in her paternal grandmother.  ROS:   Please see the history of present illness.    No fevers no chills no syncope all other systems reviewed and are negative.  EKGs/Labs/Other Studies Reviewed:    The following studies were reviewed today: As above  EKG:  EKG is  ordered today.  The ekg ordered today demonstrates sinus rhythm 68 with nonspecific ST-T wave changes, aVL.  Prior EKG showed nonspecific ST-T wave changes.  Recent Labs: 06/19/2019: ALT 18; BUN 14; Creat 0.84; Hemoglobin 12.2; Platelets 279; Potassium 3.8; Sodium 142  Recent Lipid Panel    Component Value Date/Time   CHOL 175 06/19/2019 1100   TRIG 57 06/19/2019 1100   HDL 58 06/19/2019 1100   CHOLHDL 3.0 06/19/2019 1100   VLDL 15 12/12/2016 0914   LDLCALC 103 (H) 06/19/2019 1100    Physical Exam:    VS:  BP (!) 150/66   Pulse 68   Ht '5\' 4"'  (1.626 m)   Wt 257 lb (116.6 kg)   BMI 44.11 kg/m     Wt Readings from Last 3 Encounters:  08/28/19 257 lb (116.6 kg)  07/08/19 255 lb (115.7 kg)  05/30/19 257 lb (116.6 kg)     GEN: Overweight well nourished, well developed in no acute distress HEENT: Normal NECK: No JVD; No carotid bruits LYMPHATICS: No lymphadenopathy CARDIAC: RRR, no murmurs, rubs, gallops RESPIRATORY:  Clear to auscultation without rales, wheezing or rhonchi  ABDOMEN: Soft, non-tender, non-distended MUSCULOSKELETAL:  No edema; No deformity  SKIN: Warm and dry NEUROLOGIC:  Alert and oriented x 3 PSYCHIATRIC:   Normal affect   ASSESSMENT:    No diagnosis found. PLAN:    In order of problems listed above:  Atypical chest pain -Could be gastrointestinal especially surrounding sensation of burping. Prior CT scan of the abdomen showed aortic atherosclerosis. Continue to treat with high intensity statin therapy. Continue with good blood pressure control and good pre-diabetes control.  Last hemoglobin  A1c from outside office 6.1.  LDL 103 HDL 58 creatinine 0.8 potassium 3.8 ALT 18.  Dr. Cindi Carbon has been following closely.  Occasional leg pain -It was difficult to palpate distal pulses however we will continue to monitor this.  We discussed the potential of ABIs/vascular Dopplers but at this time continue to monitor.  Essential hypertension -Blood pressure little elevated today.  Continuing to follow.  Aortic atherosclerosis -Continue with statin therapy.  Ultimate goal for LDL would be less than 70.  Morbid obesity -Continue to encourage weight loss.  When sitting on the couch she does have an exercise machine for her legs.   Medication Adjustments/Labs and Tests Ordered: Current medicines are reviewed at length with the patient today.  Concerns regarding medicines are outlined above.  No orders of the defined types were placed in this encounter.  No orders of the defined types were placed in this encounter.   There are no Patient Instructions on file for this visit.   Signed, Candee Furbish, MD  08/28/2019 9:08 AM    [facility]

## 2019-09-12 ENCOUNTER — Other Ambulatory Visit: Payer: Self-pay | Admitting: Family Medicine

## 2019-09-17 ENCOUNTER — Other Ambulatory Visit: Payer: Self-pay | Admitting: Family Medicine

## 2019-09-22 ENCOUNTER — Telehealth: Payer: Self-pay | Admitting: Family Medicine

## 2019-09-22 NOTE — Chronic Care Management (AMB) (Signed)
°  Chronic Care Management   Note  09/22/2019 Name: [name] MRN: [id number] DOB: 06/12/1940  [name] is a 79 y.o. year old female who is a primary care patient of Pickard, Cammie Mcgee, MD. I reached out to [name] by phone today in response to a referral sent by Ms. Sofie Hartigan PCP, Susy Frizzle, MD.   [name] was given information about Chronic Care Management services today including:  1. CCM service includes personalized support from designated clinical staff supervised by her physician, including individualized plan of care and coordination with other care providers 2. 24/7 contact phone numbers for assistance for urgent and routine care needs. 3. Service will only be billed when office clinical staff spend 20 minutes or more in a month to coordinate care. 4. Only one practitioner [name] furnish and bill the service in a calendar month. 5. The patient [name] stop CCM services at any time (effective at the end of the month) by phone call to the office staff.   Patient agreed to services and verbal consent obtained.   Follow up plan:   [place]

## 2019-10-22 ENCOUNTER — Other Ambulatory Visit: Payer: Self-pay | Admitting: Family Medicine

## 2019-10-29 ENCOUNTER — Ambulatory Visit: Payer: Medicare HMO

## 2019-11-18 ENCOUNTER — Other Ambulatory Visit: Payer: Self-pay | Admitting: *Deleted

## 2019-11-18 DIAGNOSIS — E7800 Pure hypercholesterolemia, unspecified: Secondary | ICD-10-CM

## 2019-11-18 DIAGNOSIS — I700 Atherosclerosis of aorta: Secondary | ICD-10-CM

## 2019-11-18 DIAGNOSIS — I159 Secondary hypertension, unspecified: Secondary | ICD-10-CM

## 2019-11-18 DIAGNOSIS — D496 Neoplasm of unspecified behavior of brain: Secondary | ICD-10-CM

## 2019-11-20 NOTE — Chronic Care Management (AMB) (Addendum)
Chronic Care Management Pharmacy  Name: [name]  MRN: [id number] DOB: Jul 08, 1940  Chief Complaint/ HPI  [name],  79 y.o. , female presents for their Initial CCM visit with the clinical pharmacist In office.  PCP : Susy Frizzle, MD  Their chronic conditions include: hypertension, hyperlipidemia, rheumatoid arthritis.  Office Visits:  07/08/2019 Dennard Schaumann) - chronic constipation, trial of Amitiza 46mg twice daily, BP elevated asked to check twice daily for 3 to 4 days and report, [name] increase doxazosin  05/30/2019 ([facility]) - abnormal urine odor, UA suggests UTI, and push fluids, elevated BP again recommended patient to go home and check and can take an extra tablet of doxazosin  Consult Visit:  08/28/2019 ([facility]) - chest pain, blood pressure slightly elevated, goal LDL < 70, encouraged weight loss and leg exercises when sitting on couch  Medications: Outpatient Encounter Medications as of 11/21/2019  Medication Sig   ACCU-CHEK AVIVA PLUS test strip TEST BLOOD SUGAR EVERY DAY   Accu-Chek Softclix Lancets lancets CHECK BLOOD SUGAR EVERY MORNING   amLODipine (NORVASC) 10 MG tablet TAKE 1 TABLET EVERY DAY   Ascorbic Acid (VITAMIN C) 1000 MG tablet Take 1,000 mg by mouth daily.     aspirin 81 MG chewable tablet Chew 81 mg by mouth daily.     atorvastatin (LIPITOR) 80 MG tablet TAKE 1/2 TABLET EVERY DAY   Blood Glucose Monitoring Suppl (ACCU-CHEK AVIVA PLUS) w/Device KIT USE AS DIRECTED TO CHECK BLOOD SUGAR   Cholecalciferol (VITAMIN D) 2000 UNITS tablet Take 1,000 Units by mouth daily.    Coenzyme Q10 (CO Q-10) 100 MG CAPS Take by mouth.   doxazosin (CARDURA) 1 MG tablet TAKE 1 TABLET (1 MG TOTAL) BY MOUTH DAILY.   Lancet Devices (ACCU-CHEK SOFTCLIX) lancets Check BS qd - DX- E11.9   losartan-hydrochlorothiazide (HYZAAR) 100-25 MG tablet TAKE 1 TABLET EVERY DAY   metoprolol succinate (TOPROL-XL) 50 MG 24 hr tablet TAKE 1 TABLET EVERY DAY WITH OR  IMMEDIATELY FOLLOWING A MEAL.   naproxen sodium (ALEVE) 220 MG tablet Take 220 mg by mouth daily as needed (knee pain).    Omega-3 Fatty Acids (FISH OIL CONCENTRATE PO) Take by mouth.   Polyethyl Glycol-Propyl Glycol (SYSTANE) 0.4-0.3 % SOLN Apply to eye.   polyethylene glycol (MIRALAX / GLYCOLAX) packet Take 17 g by mouth daily.   pyridOXINE (VITAMIN B-6) 100 MG tablet Take 100 mg by mouth daily as needed.    No facility-administered encounter medications on file as of 11/21/2019.     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    Difficulty of Paying Living Expenses: Not very hard    Goals Addressed             This Visit's Progress    Pharmacy Care Plan:       CARE PLAN ENTRY (see longitudinal plan of care for additional care plan information)  Current Barriers:  Chronic Disease Management support, education, and care coordination needs related to Hypertension, Hyperlipidemia, and rheumatoid arthritis   Hypertension BP Readings from Last 3 Encounters:  08/28/19 (!) 150/66  07/08/19 (!) 172/80  05/30/19 (!) 180/82   Pharmacist Clinical Goal(s): Over the next 180 days, patient will work with PharmD and providers to maintain BP goal <140/90 Current regimen:  Amlodipine 138mdaily Losartan-HCTZ 100-2544maily Metoprolol Succinate 35m4mily Doxazosin 1mg 16mly Interventions: Reviewed home blood pressure readings Discussed timing of blood pressure medications Patient self care activities - Over the next 180 days,  patient will: Check BP daily, document, and provide at future appointments Ensure daily salt intake < 2300 mg/day Contact providers if BP consistently > 140/90.  Hyperlipidemia Lab Results  Component Value Date/Time   LDLCALC 103 (H) 06/19/2019 11:00 AM   Pharmacist Clinical Goal(s): Over the next 180 days, patient will work with PharmD and providers to achieve LDL goal < 70 Current regimen:  Atorvastatin 57m take one-half  tablet daily Interventions: Reviewed most recent lipid panel Discussed goals and importance of low LDL Counseled on importance of medication adherence Patient self care activities - Over the next 180 days, patient will: Continue to focus on medication adherence by pill count Work on decreasing high fat content foods  Rheumatoid Arthritis Pharmacist Clinical Goal(s) Over the next 180 days, patient will work with PharmD and providers to optimize medication and minimize symptoms related to RA. Current regimen:  Aleve 220 mg as needed Interventions: Discussed current pain and frequency of NSAID use Discussed current exercise plan Patient self care activities - Over the next 180 days, patient will: Continue to exercise using weights and step machine Contact providers with any increase in pain   Initial goal documentation         Hypertension    Office blood pressures are  BP Readings from Last 3 Encounters:  08/28/19 (!) 150/66  07/08/19 (!) 172/80  05/30/19 (!) 180/82    Patient has failed these meds in the past: atenolol 574m Patient checks BP at home daily  Patient home BP readings are ranging: 130-140/70-75 per patient  Patient is currently controlled based on home BP on the following medications: Amlodipine 1031mosartan-HCTZ 100-25m80mtoprolol Succinate 50mg59mly Doxazosin 1mg d68my  Patient reports no dizziness or chest pain.  Patient verbalizes correct way to check blood pressure.  [name] suffer from white coat syndrome as her office BP is elevated, however, she reports normal BP at home.  Has had her meter checked for accuracy here in the office.  No swelling noted.  Patient doing weight and step exercises when sitting on the couch watching TV.  Plan  Continue current medications, monitor BP daily, notify providers if consistently > 140/90.      Hyperlipidemia   LDL goal < 70  Lipid Panel     Component Value Date/Time   CHOL 175 06/19/2019 1100    TRIG 57 06/19/2019 1100   HDL 58 06/19/2019 1100   LDLCALC 103 (H) 06/19/2019 1100    Hepatic Function Latest Ref Rng & Units 06/19/2019 11/19/2018 04/18/2018  Total Protein 6.1 - 8.1 g/dL 7.2 7.7 7.5  Albumin 3.6 - 5.1 g/dL - - -  AST 10 - 35 U/L '19 20 21  ' ALT 6 - 29 U/L '18 17 17  ' Alk Phosphatase 33 - 130 U/L - - -  Total Bilirubin 0.2 - 1.2 mg/dL 0.5 0.3 0.5  Bilirubin, Direct 0.0 - 0.3 mg/dL - - -     The 10-year ASCVD risk score (Goff Mikey Bussing., et al., 2013) is: 40.2%   Values used to calculate the score:     Age: 30 yea39     Sex: Female     Is Non-Hispanic African American: Yes     Diabetic: Yes     Tobacco smoker: No     Systolic Blood Pressure: 150 mm248    Is BP treated: Yes     HDL Cholesterol: 58 mg/dL     Total Cholesterol: 175 mg/dL   Patient has failed these meds  in past: fenofibrate 16m Patient is currently uncontrolled on the following medications:  Atorvastatin 849m- one-half tablet po daily  Patient last LDL elevated based on personal goal.  Discussed patient's diet which she mentions consisted of a lot of steaks/ribs/hamburger.  Discussed not avoiding these foods, but limiting our serving sizes and frequency of consumption.  Reports adherence with statin, although she has surplus from [facility] Based on fill history looks like last fill the sent ~ 20 days early.  Plan  Continue current medications, limit high fat content foods in diet, recheck lipids at next annual visit.   RA   Patient has failed these meds in past: diclofenac, Mobic Patient is currently controlled on the following medications:  Aleve 22087mrn  Patient reports her pain overall is controlled, very rarely has to take Aleve for arthritis.    Plan  Continue current medications  Vaccines   Reviewed and discussed patient's vaccination history.    Immunization History  Administered Date(s) Administered   Fluad Quad(high Dose 65+) 01/31/2019   Influenza Whole 01/17/2010,  02/07/2012   Influenza, High Dose Seasonal PF 02/13/2017   Influenza,inj,Quad PF,6+ Mos 02/04/2013, 02/11/2014, 02/16/2015, 01/18/2016, 01/25/2018   Pneumococcal Conjugate-13 05/28/2014   Pneumococcal Polysaccharide-23 04/25/2005, 02/11/2013   Td 10/14/1998   Tdap 12/06/2017   Patient also reports she had both COVID-19 shots in Feb/march.   Plan  Recommended patient receive shingles vaccine in office.   Medication Management   Miscellaneous medications: None Rx OTC's:  Fish oil CoQ10 Vitamin C ASA 18m31mralax Patient reports still having issues with constipation, reports she was not taking daily.  Recommended increase Miralax to daily (17g) to see if this helps relieve symptoms Patient currently uses [facility]  [phone] reports using weekly pill box method to organize medications and promote adherence. Patient denies missed doses of medication.   [name] Clinical Pharmacist [phone] collaborated with the care management provider regarding care management and care coordination activities outlined in this encounter and have reviewed this encounter including documentation in the note and care plan. I am certifying that I agree with the content of this note and encounter as supervising physician.

## 2019-11-21 ENCOUNTER — Ambulatory Visit: Payer: Medicare HMO | Admitting: Pharmacist

## 2019-11-21 ENCOUNTER — Other Ambulatory Visit: Payer: Self-pay

## 2019-11-21 DIAGNOSIS — M069 Rheumatoid arthritis, unspecified: Secondary | ICD-10-CM

## 2019-11-21 DIAGNOSIS — I159 Secondary hypertension, unspecified: Secondary | ICD-10-CM

## 2019-11-21 DIAGNOSIS — E7800 Pure hypercholesterolemia, unspecified: Secondary | ICD-10-CM

## 2019-11-21 NOTE — Patient Instructions (Addendum)
Visit Information Thank you for meeting with me today!  I look forward to working with you to help you meet all of your healthcare goals and answer any questions you may have.  Feel free to contact me anytime!  Goals Addressed            This Visit's Progress   . Pharmacy Care Plan:       CARE PLAN ENTRY (see longitudinal plan of care for additional care plan information)  Current Barriers:  . Chronic Disease Management support, education, and care coordination needs related to Hypertension, Hyperlipidemia, and rheumatoid arthritis   Hypertension BP Readings from Last 3 Encounters:  08/28/19 (!) 150/66  07/08/19 (!) 172/80  05/30/19 (!) 180/82   . Pharmacist Clinical Goal(s): o Over the next 180 days, patient will work with PharmD and providers to maintain BP goal <140/90 . Current regimen:  o Amlodipine 10mg  daily o Losartan-HCTZ 100-25mg  daily o Metoprolol Succinate 50mg  daily o Doxazosin 1mg  daily . Interventions: o Reviewed home blood pressure readings o Discussed timing of blood pressure medications . Patient self care activities - Over the next 180 days, patient will: o Check BP daily, document, and provide at future appointments o Ensure daily salt intake < 2300 mg/day o Contact providers if BP consistently > 140/90.  Hyperlipidemia Lab Results  Component Value Date/Time   LDLCALC 103 (H) 06/19/2019 11:00 AM   . Pharmacist Clinical Goal(s): o Over the next 180 days, patient will work with PharmD and providers to achieve LDL goal < 70 . Current regimen:  o Atorvastatin 80mg  take one-half tablet daily . Interventions: o Reviewed most recent lipid panel o Discussed goals and importance of low LDL o Counseled on importance of medication adherence . Patient self care activities - Over the next 180 days, patient will: o Continue to focus on medication adherence by pill count o Work on decreasing high fat content foods  Rheumatoid Arthritis . Pharmacist Clinical  Goal(s) o Over the next 180 days, patient will work with PharmD and providers to optimize medication and minimize symptoms related to RA. . Current regimen:  o Aleve 220 mg as needed . Interventions: o Discussed current pain and frequency of NSAID use o Discussed current exercise plan . Patient self care activities - Over the next 180 days, patient will: o Continue to exercise using weights and step machine o Contact providers with any increase in pain   Initial goal documentation        [name] was given information about Chronic Care Management services today including:  1. CCM service includes personalized support from designated clinical staff supervised by her physician, including individualized plan of care and coordination with other care providers 2. 24/7 contact phone numbers for assistance for urgent and routine care needs. 3. Standard insurance, coinsurance, copays and deductibles apply for chronic care management only during months in which we provide at least 20 minutes of these services. Most insurances cover these services at 100%, however patients may be responsible for any copay, coinsurance and/or deductible if applicable. This service may help you avoid the need for more expensive face-to-face services. 4. Only one practitioner may furnish and bill the service in a calendar month. 5. The patient may stop CCM services at any time (effective at the end of the month) by phone call to the office staff.  Patient agreed to services and verbal consent obtained.   The patient verbalized understanding of instructions provided today and agreed to receive a mailed  copy of patient instruction and/or educational materials. Telephone follow up appointment with pharmacy team member scheduled for: 5 months  Jaymeson Mengel, PharmD Clinical Pharmacist [facility] [phone]   Preventing High Cholesterol Cholesterol is a white, waxy substance similar to fat  that the human body needs to help build cells. The liver makes all the cholesterol that a person's body needs. Having high cholesterol (hypercholesterolemia) increases a person's risk for heart disease and stroke. Extra (excess) cholesterol comes from the food the person eats. High cholesterol can often be prevented with diet and lifestyle changes. If you already have high cholesterol, you can control it with diet and lifestyle changes and with medicine. How can high cholesterol affect me? If you have high cholesterol, deposits (plaques) may build up on the walls of your arteries. The arteries are the blood vessels that carry blood away from your heart. Plaques make the arteries narrower and stiffer. This can limit or block blood flow and cause blood clots to form. Blood clots:  Are tiny balls of cells that form in your blood.  Can move to the heart or brain, causing a heart attack or stroke. Plaques in arteries greatly increase your risk for heart attack and stroke.Making diet and lifestyle changes can reduce your risk for these conditions that may threaten your life. What can increase my risk? This condition is more likely to develop in people who:  Eat foods that are high in saturated fat or cholesterol. Saturated fat is mostly found in: ? Foods that contain animal fat, such as red meat and some dairy products. ? Certain fatty foods made from plants, such as tropical oils.  Are overweight.  Are not getting enough exercise.  Have a family history of high cholesterol. What actions can I take to prevent this? Nutrition   Eat less saturated fat.  Avoid trans fats (partially hydrogenated oils). These are often found in margarine and in some baked goods, fried foods, and snacks bought in packages.  Avoid precooked or cured meat, such as sausages or meat loaves.  Avoid foods and drinks that have added sugars.  Eat more fruits, vegetables, and whole grains.  Choose healthy sources of  protein, such as fish, poultry, lean cuts of red meat, beans, peas, lentils, and nuts.  Choose healthy sources of fat, such as: ? Nuts. ? Vegetable oils, especially olive oil. ? Fish that have healthy fats (omega-3 fatty acids), such as mackerel or salmon. The items listed above may not be a complete list of recommended foods and beverages. Contact a dietitian for more information. Lifestyle  Lose weight if you are overweight. Losing 5-10 lb (2.3-4.5 kg) can help prevent or control high cholesterol. It can also lower your risk for diabetes and high blood pressure. Ask your health care provider to help you with a diet and exercise plan to lose weight safely.  Do not use any products that contain nicotine or tobacco, such as cigarettes, e-cigarettes, and chewing tobacco. If you need help quitting, ask your health care provider.  Limit your alcohol intake. ? Do not drink alcohol if:  Your health care provider tells you not to drink.  You are pregnant, may be pregnant, or are planning to become pregnant. ? If you drink alcohol:  Limit how much you use to:  0-1 drink a day for women.  0-2 drinks a day for men.  Be aware of how much alcohol is in your drink. In the [place] one drink equals one 12  oz bottle of beer (355 mL), one 5 oz glass of wine (148 mL), or one 1 oz glass of hard liquor (44 mL). Activity   Get enough exercise. Each week, do at least 150 minutes of exercise that takes a medium level of effort (moderate-intensity exercise). ? This is exercise that:  Makes your heart beat faster and makes you breathe harder than usual.  Allows you to still be able to talk. ? You could exercise in short sessions several times a day or longer sessions a few times a week. For example, on 5 days each week, you could walk fast or ride your bike 3 times a day for 10 minutes each time.  Do exercises as told by your health care provider. Medicines  In addition to diet and lifestyle changes,  your health care provider may recommend medicines to help lower cholesterol. This may be a medicine to lower the amount of cholesterol your liver makes. You may need medicine if: ? Diet and lifestyle changes do not lower your cholesterol enough. ? You have high cholesterol and other risk factors for heart disease or stroke.  Take over-the-counter and prescription medicines only as told by your health care provider. General information  Manage your risk factors for high cholesterol. Talk with your health care provider about all your risk factors and how to lower your risk.  Manage other conditions that you have, such as diabetes or high blood pressure (hypertension).  Have blood tests to check your cholesterol levels at regular points in time as told by your health care provider.  Keep all follow-up visits as told by your health care provider. This is important. Where to find more information  American Heart Association: www.heart.org  National Heart, Lung, and Blood Institute: https://wilson-eaton.com/ Summary  High cholesterol increases your risk for heart disease and stroke. By keeping your cholesterol level low, you can reduce your risk for these conditions.  High cholesterol can often be prevented with diet and lifestyle changes.  Work with your health care provider to manage your risk factors, and have your blood tested regularly. This information is not intended to replace advice given to you by your health care provider. Make sure you discuss any questions you have with your health care provider. Document Revised: 08/23/2018 Document Reviewed: 01/08/2016 Elsevier Patient Education  2020 [employer].

## 2019-11-26 ENCOUNTER — Other Ambulatory Visit: Payer: Self-pay | Admitting: Family Medicine

## 2019-12-01 ENCOUNTER — Other Ambulatory Visit: Payer: Self-pay

## 2019-12-01 ENCOUNTER — Ambulatory Visit (INDEPENDENT_AMBULATORY_CARE_PROVIDER_SITE_OTHER): Payer: Medicare HMO | Admitting: Family Medicine

## 2019-12-01 VITALS — BP 150/60 | HR 86 | Temp 97.9°F | Ht 64.0 in | Wt 257.0 lb

## 2019-12-01 DIAGNOSIS — R140 Abdominal distension (gaseous): Secondary | ICD-10-CM

## 2019-12-01 DIAGNOSIS — I10 Essential (primary) hypertension: Secondary | ICD-10-CM | POA: Diagnosis not present

## 2019-12-01 DIAGNOSIS — E7800 Pure hypercholesterolemia, unspecified: Secondary | ICD-10-CM | POA: Diagnosis not present

## 2019-12-01 NOTE — Progress Notes (Signed)
Subjective:    Patient ID: [name], female    DOB: 10/05/1940, 79 y.o.   MRN: [id number] 2/21 Patient's blood pressure is extremely high today however the patient is extremely tearful.  Her husband passed away approximately 20 years ago and she is approaching the anniversary of that.  This has her feeling very upset today.  The COVID-19 pandemic has her feeling extremely lonely and isolated.  All of it became too much this morning and she is extremely tearful and crying.  I believe this [name] be artificially elevating her blood pressure.  We discussed this at length and I have asked the patient to monitor her blood pressure at home which she does almost on a daily basis.  I would like her to report the values to me in a week.  If she is consistently in the low 140s over 80s I would not make any changes in her blood pressure medication.  Patient does occasionally have elevations in her blood pressure due to anxiety and being upset.  However she also reports abdominal discomfort.  She reports increased bloating.  She reports increased indigestion.  She will have sharp chest pains that improved after she burps.  The pains occur suddenly and without warning and resolve immediately upon burping.  She states that she is only having a bowel movement 1-2 times per week and it is not a complete bowel movement.  She does not feel like she is emptying fully.  She believes this is contributing to her bloating.  She also reports increased indigestion and gas in her stomach.  She denies any angina or shortness of breath or dyspnea on exertion.  At that time, my plan was: I believe the patient's bloating and abdominal discomfort and burping could be due to chronic constipation.  I recommended trying Amitiza 8 mcg p.o. twice daily and I gave her sufficient samples to try this for 2 to 3 weeks to see if this helps with the bloating and constipation.  She is already taking MiraLAX on a daily basis as well as milk of  magnesia occasionally.  I stated that she could add Amitiza to the MiraLAX without any difficulty.  If this does not work, we [name] need to add a stimulant laxative such as Dulcolax.  I am concerned about her blood pressure but she is also very upset today.  Therefore I have asked the patient to check her blood pressure twice a day for the next 3 to 4 days and notify me of the values prior to making any changes in her medication.  If so we can increase her doxazosin.  At the present time she is only taking 1 mg a day.  12/01/19 Patient last had a colonoscopy in 2015.  This showed colon polyps as well as diverticulosis.  Patient states that last time when I gave her the Amitiza, her symptoms improved.  She states that she "took it all up".  Afterwards she felt much better.  However, the patient states that over the last 2 weeks she has developed abdominal bloating.  She feels full.  She feels increased gas in her epigastric area.  If she lays down, she will burp and feel extremely bloated.  After she burps she feels better.  She does report decreased frequency of bowel movements.  She reports decreased flatus.  Today on exam, the abdomen is soft, nondistended, nontender however she does have diminished bowel sounds in all 4 quadrants.  She denies any fevers  or chills.  She denies any nausea or vomiting.  She denies any melena or hematochezia.  She denies any abdominal pain.  Blood pressure today is mildly elevated but for age I believe is acceptable.  Past Medical History:  Diagnosis Date  . Arthritis    rheumatoid arthritis  . Brain tumor ([place])   . Chest pain   . Colon polyps   . Dyspepsia   . Gallbladder sludge   . Hypercholesterolemia   . Hypertension   . Left atrial dilatation    Mild  . Mild aortic sclerosis    without stenosis  . Mitral annular calcification    Mild  . Mitral valve disorder    Mildly thickened mitral valve, but with normal leaflet but with normal leaflet   . 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 lt eye implant  1998  . rt knee surg  1991  . TUBAL LIGATION  1981   Current Outpatient Medications on File Prior to Visit  Medication Sig Dispense Refill  . ACCU-CHEK AVIVA PLUS test strip TEST BLOOD SUGAR EVERY DAY 100 strip 3  . Accu-Chek Softclix Lancets lancets CHECK BLOOD SUGAR EVERY MORNING 100 each 3  . amLODipine (NORVASC) 10 MG tablet TAKE 1 TABLET EVERY DAY 90 tablet 0  . Ascorbic Acid (VITAMIN C) 1000 MG tablet Take 1,000 mg by mouth daily.      Marland Kitchen aspirin 81 MG chewable tablet Chew 81 mg by mouth daily.      Marland Kitchen atorvastatin (LIPITOR) 80 MG tablet TAKE 1/2 TABLET EVERY DAY 45 tablet 3  . Blood Glucose Monitoring Suppl (ACCU-CHEK AVIVA PLUS) w/Device KIT USE AS DIRECTED TO CHECK BLOOD SUGAR 1 kit 0  . Cholecalciferol (VITAMIN D) 2000 UNITS tablet Take 1,000 Units by mouth daily.     . Coenzyme Q10 (CO Q-10) 100 MG CAPS Take by mouth.    . doxazosin (CARDURA) 1 MG tablet TAKE 1 TABLET (1 MG TOTAL) BY MOUTH DAILY. 90 tablet 3  . Lancet Devices (ACCU-CHEK SOFTCLIX) lancets Check BS qd - DX- E11.9 1 each 5  . losartan-hydrochlorothiazide (HYZAAR) 100-25 MG tablet TAKE 1 TABLET EVERY DAY 90 tablet 1  . metoprolol succinate (TOPROL-XL) 50 MG 24 hr tablet TAKE 1 TABLET EVERY DAY WITH OR IMMEDIATELY FOLLOWING A MEAL. 90 tablet 0  . naproxen sodium (ALEVE) 220 MG tablet Take 220 mg by mouth daily as needed (knee pain).     . Omega-3 Fatty Acids (FISH OIL CONCENTRATE PO) Take by mouth.    Vladimir Faster Glycol-Propyl Glycol (SYSTANE) 0.4-0.3 % SOLN Apply to eye.    . polyethylene glycol (MIRALAX / GLYCOLAX) packet Take 17 g by mouth daily.    Marland Kitchen pyridOXINE (VITAMIN B-6) 100 MG tablet Take 100 mg by mouth daily as needed.      No current facility-administered medications on file prior to visit.   Allergies  Allergen Reactions  . Vytorin [Ezetimibe-Simvastatin] Other (See  Comments)    myalgia   Social History   Socioeconomic History  . Marital status: Widowed    Spouse name: Not on file  . Number of children: Not on file  . Years of education: Not on file  . Highest education level: Not on file  Occupational History  . Not on file  Tobacco Use  . Smoking status: Former Smoker    Quit date: 09/07/1973  Years since quitting: 46.2  . Smokeless tobacco: Never Used  Vaping Use  . Vaping Use: Never used  Substance and Sexual Activity  . Alcohol use: No  . Drug use: No  . Sexual activity: Not Currently  Other Topics Concern  . Not on file  Social History Narrative  . Not on file   Social Determinants of Health   Financial Resource Strain: Low Risk   . Difficulty of Paying Living Expenses: Not very hard  Food Insecurity:   . Worried About Charity fundraiser in the Last Year:   . Arboriculturist in the Last Year:   Transportation Needs:   . Film/video editor (Medical):   Marland Kitchen Lack of Transportation (Non-Medical):   Physical Activity:   . Days of Exercise per Week:   . Minutes of Exercise per Session:   Stress:   . Feeling of Stress :   Social Connections:   . Frequency of Communication with Friends and Family:   . Frequency of Social Gatherings with Friends and Family:   . Attends Religious Services:   . Active Member of Clubs or Organizations:   . Attends Archivist Meetings:   Marland Kitchen Marital Status:   Intimate Partner Violence:   . Fear of Current or Ex-Partner:   . Emotionally Abused:   Marland Kitchen Physically Abused:   . Sexually Abused:       Review of Systems  All other systems reviewed and are negative.      Objective:   Physical Exam Vitals reviewed.  Constitutional:      General: She is not in acute distress.    Appearance: She is well-developed. She is not diaphoretic.  HENT:     Head: Normocephalic and atraumatic.  Neck:     Thyroid: No thyromegaly.     Vascular: No JVD.  Cardiovascular:     Rate and Rhythm:  Normal rate and regular rhythm.     Heart sounds: Normal heart sounds. No murmur heard.   Pulmonary:     Effort: Pulmonary effort is normal. No respiratory distress.     Breath sounds: Normal breath sounds. No wheezing or rales.  Abdominal:     General: Bowel sounds are decreased. There is no distension.     Palpations: Abdomen is soft.     Tenderness: There is no abdominal tenderness. There is no rebound.  Musculoskeletal:     Cervical back: Neck supple.  Lymphadenopathy:     Cervical: No cervical adenopathy.           Assessment & Plan:  Pure hypercholesterolemia - Plan: CBC with Differential/Platelet, COMPLETE METABOLIC PANEL WITH GFR, Lipid panel  Essential hypertension - Plan: CBC with Differential/Platelet, COMPLETE METABOLIC PANEL WITH GFR, Lipid panel  Bloating  Patient's bowel sounds are diminished.  The remainder of her abdominal exam is normal.  I believe most likely the patient has chronic constipation causing bloating.  I would like her to try Linzess.  I gave her samples of 72 mcg tablets.  She can take 1 to 2 capsules daily.  Reassess in 1 week if symptoms are improving.  Meanwhile check a CBC, CMP, fasting lipid panel while the patient is here.  Given her age, I am comfortable with a systolic blood pressure of 150 or less.

## 2019-12-02 LAB — COMPLETE METABOLIC PANEL WITH GFR
AG Ratio: 1.3 (calc) (ref 1.0–2.5)
ALT: 16 U/L (ref 6–29)
AST: 18 U/L (ref 10–35)
Albumin: 4.1 g/dL (ref 3.6–5.1)
Alkaline phosphatase (APISO): 38 U/L (ref 37–153)
BUN: 23 mg/dL (ref 7–25)
CO2: 29 mmol/L (ref 20–32)
Calcium: 9.7 mg/dL (ref 8.6–10.4)
Chloride: 106 mmol/L (ref 98–110)
Creat: 0.84 mg/dL (ref 0.60–0.93)
GFR, Est African American: 77 mL/min/{1.73_m2} (ref >=60)
GFR, Est Non African American: 67 mL/min/{1.73_m2} (ref >=60)
Globulin: 3.1 g/dL (calc) (ref 1.9–3.7)
Glucose, Bld: 101 mg/dL — ABNORMAL HIGH (ref 65–99)
Potassium: 3.8 mmol/L (ref 3.5–5.3)
Sodium: 141 mmol/L (ref 135–146)
Total Bilirubin: 0.4 mg/dL (ref 0.2–1.2)
Total Protein: 7.2 g/dL (ref 6.1–8.1)

## 2019-12-02 LAB — CBC WITH DIFFERENTIAL/PLATELET
Absolute Monocytes: 521 cells/uL (ref 200–950)
Basophils Absolute: 20 cells/uL (ref 0–200)
Basophils Relative: 0.3 %
Eosinophils Absolute: 191 cells/uL (ref 15–500)
Eosinophils Relative: 2.9 %
HCT: 36.7 % (ref 35.0–45.0)
Hemoglobin: 12.3 g/dL (ref 11.7–15.5)
Lymphs Abs: 2944 cells/uL (ref 850–3900)
MCH: 27 pg (ref 27.0–33.0)
MCHC: 33.5 g/dL (ref 32.0–36.0)
MCV: 80.7 fL (ref 80.0–100.0)
MPV: 10.4 fL (ref 7.5–12.5)
Monocytes Relative: 7.9 %
Neutro Abs: 2924 cells/uL (ref 1500–7800)
Neutrophils Relative %: 44.3 %
Platelets: 293 10*3/uL (ref 140–400)
RBC: 4.55 10*6/uL (ref 3.80–5.10)
RDW: 15 % (ref 11.0–15.0)
Total Lymphocyte: 44.6 %
WBC: 6.6 10*3/uL (ref 3.8–10.8)

## 2019-12-02 LAB — LIPID PANEL
Cholesterol: 164 mg/dL (ref <200)
HDL: 61 mg/dL (ref >=50)
LDL Cholesterol (Calc): 89 mg/dL (calc)
Non-HDL Cholesterol (Calc): 103 mg/dL (calc) (ref <130)
Total CHOL/HDL Ratio: 2.7 (calc) (ref <5.0)
Triglycerides: 54 mg/dL (ref <150)

## 2019-12-05 ENCOUNTER — Encounter: Payer: Self-pay | Admitting: *Deleted

## 2020-01-27 DIAGNOSIS — H26492 Other secondary cataract, left eye: Secondary | ICD-10-CM | POA: Diagnosis not present

## 2020-01-27 DIAGNOSIS — H524 Presbyopia: Secondary | ICD-10-CM | POA: Diagnosis not present

## 2020-01-27 DIAGNOSIS — H4720 Unspecified optic atrophy: Secondary | ICD-10-CM | POA: Diagnosis not present

## 2020-01-27 DIAGNOSIS — H25091 Other age-related incipient cataract, right eye: Secondary | ICD-10-CM | POA: Diagnosis not present

## 2020-01-27 DIAGNOSIS — H5201 Hypermetropia, right eye: Secondary | ICD-10-CM | POA: Diagnosis not present

## 2020-01-27 DIAGNOSIS — H52222 Regular astigmatism, left eye: Secondary | ICD-10-CM | POA: Diagnosis not present

## 2020-01-27 DIAGNOSIS — Z961 Presence of intraocular lens: Secondary | ICD-10-CM | POA: Diagnosis not present

## 2020-01-27 DIAGNOSIS — E119 Type 2 diabetes mellitus without complications: Secondary | ICD-10-CM | POA: Diagnosis not present

## 2020-01-28 ENCOUNTER — Ambulatory Visit (INDEPENDENT_AMBULATORY_CARE_PROVIDER_SITE_OTHER): Payer: Medicare HMO

## 2020-01-28 ENCOUNTER — Other Ambulatory Visit: Payer: Self-pay

## 2020-01-28 DIAGNOSIS — Z23 Encounter for immunization: Secondary | ICD-10-CM

## 2020-01-30 ENCOUNTER — Other Ambulatory Visit: Payer: Self-pay | Admitting: Nurse Practitioner

## 2020-03-02 ENCOUNTER — Other Ambulatory Visit (HOSPITAL_COMMUNITY): Payer: Self-pay | Admitting: Family Medicine

## 2020-03-02 DIAGNOSIS — Z1231 Encounter for screening mammogram for malignant neoplasm of breast: Secondary | ICD-10-CM

## 2020-03-31 ENCOUNTER — Other Ambulatory Visit: Payer: Self-pay | Admitting: Family Medicine

## 2020-04-09 ENCOUNTER — Other Ambulatory Visit: Payer: Self-pay | Admitting: Family Medicine

## 2020-04-20 ENCOUNTER — Ambulatory Visit: Payer: Self-pay | Admitting: Pharmacist

## 2020-04-20 NOTE — Chronic Care Management (AMB) (Addendum)
Chronic Care Management   Follow Up Note   04/20/2020 Name: [name] MRN: [id number] DOB: 22-Feb-1941  Referred by: Susy Frizzle, MD Reason for referral : No chief complaint on file.   [name] is a 79 y.o. year old female who is a primary care patient of Pickard, Cammie Mcgee, MD. The CCM team was consulted for assistance with chronic disease management and care coordination needs.    Review of patient status, including review of consultants reports, relevant laboratory and other test results, and collaboration with appropriate care team members and the patient's provider was performed as part of comprehensive patient evaluation and provision of chronic care management services.    SDOH (Social Determinants of Health) assessments performed: No See Care Plan activities for detailed interventions related to [facility])      Outpatient Encounter Medications as of 04/20/2020  Medication Sig   ACCU-CHEK AVIVA PLUS test strip TEST BLOOD SUGAR EVERY DAY   Accu-Chek Softclix Lancets lancets CHECK BLOOD SUGAR EVERY MORNING   amLODipine (NORVASC) 10 MG tablet TAKE 1 TABLET EVERY DAY   Ascorbic Acid (VITAMIN C) 1000 MG tablet Take 1,000 mg by mouth daily.     aspirin 81 MG chewable tablet Chew 81 mg by mouth daily.     atorvastatin (LIPITOR) 80 MG tablet TAKE 1/2 TABLET EVERY DAY   Blood Glucose Monitoring Suppl (ACCU-CHEK AVIVA PLUS) w/Device KIT USE AS DIRECTED TO CHECK BLOOD SUGAR   Cholecalciferol (VITAMIN D) 2000 UNITS tablet Take 1,000 Units by mouth daily.    Coenzyme Q10 (CO Q-10) 100 MG CAPS Take by mouth.   doxazosin (CARDURA) 1 MG tablet TAKE 1 TABLET (1 MG TOTAL) BY MOUTH DAILY.   Lancet Devices (ACCU-CHEK SOFTCLIX) lancets Check BS qd - DX- E11.9   losartan-hydrochlorothiazide (HYZAAR) 100-25 MG tablet TAKE 1 TABLET EVERY DAY   metoprolol succinate (TOPROL-XL) 50 MG 24 hr tablet TAKE 1 TABLET EVERY DAY WITH OR IMMEDIATELY FOLLOWING A MEAL.   naproxen sodium (ALEVE)  220 MG tablet Take 220 mg by mouth daily as needed (knee pain).    Omega-3 Fatty Acids (FISH OIL CONCENTRATE PO) Take by mouth.   Polyethyl Glycol-Propyl Glycol (SYSTANE) 0.4-0.3 % SOLN Apply to eye.   polyethylene glycol (MIRALAX / GLYCOLAX) packet Take 17 g by mouth daily.   pyridOXINE (VITAMIN B-6) 100 MG tablet Take 100 mg by mouth daily as needed.    No facility-administered encounter medications on file as of 04/20/2020.    Reviewed chart prior to disease state call. Spoke with patient regarding BP  Recent OV's 12/01/2019 (Pickard) - patient presents with diminished bowel sounds, she was given samples of Linzess 55mg tablets, slightly elevated BP, due to age systolic goal is < 1875  Recent Office Vitals: BP Readings from Last 3 Encounters:  12/01/19 (!) 150/60  08/28/19 (!) 150/66  07/08/19 (!) 172/80   Pulse Readings from Last 3 Encounters:  12/01/19 86  08/28/19 68  07/08/19 86    Wt Readings from Last 3 Encounters:  12/01/19 257 lb (116.6 kg)  08/28/19 257 lb (116.6 kg)  07/08/19 255 lb (115.7 kg)     Kidney Function Lab Results  Component Value Date/Time   CREATININE 0.84 12/01/2019 08:37 AM   CREATININE 0.84 06/19/2019 11:00 AM   GFRNONAA 67 12/01/2019 08:37 AM   GFRAA 77 12/01/2019 08:37 AM    BMP Latest Ref Rng & Units 12/01/2019 06/19/2019 11/19/2018  Glucose 65 - 99 mg/dL 101(H) 106(H) 103(H)  BUN 7 -  25 mg/dL '23 14 23  ' Creatinine 0.60 - 0.93 mg/dL 0.84 0.84 0.88  BUN/Creat Ratio 6 - 22 (calc) NOT APPLICABLE NOT APPLICABLE NOT APPLICABLE  Sodium 829 - 146 mmol/L 141 142 140  Potassium 3.5 - 5.3 mmol/L 3.8 3.8 3.9  Chloride 98 - 110 mmol/L 106 105 105  CO2 20 - 32 mmol/L '29 28 27  ' Calcium 8.6 - 10.4 mg/dL 9.7 10.2 10.2    Current antihypertensive regimen:  Amlodipine 5m daily Losartan-HCTZ 100-298mdaily Metoprolol XL 5052maily Doxazosin 1mg8mily How often are you checking your Blood Pressure? daily, sometimes twice daily Current home BP readings:  138-150/60-70s  What recent interventions/DTPs have been made by any provider to improve Blood Pressure control since last CPP Visit: None Any recent hospitalizations or ED visits since last visit with CPP? No What diet changes have been made to improve Blood Pressure Control?  None, watching sodium intake What exercise is being done to improve your Blood Pressure Control?  None currently  Adherence Review: Is the patient currently on ACE/ARB medication? Yes Does the patient have >5 day gap between last estimated fill dates? No  Patient reports she is doing well overall, her Bps for the most part have been controlled.  Per PCP systolic OK in the < 150 603ch she reports has been the case.  Denies adverse effects with medications and has plenty of supply on hand at the moment.  Verbalized 100% adherence for all BP medications.  [name] Clinical Pharmacist [phone] have collaborated with the care management provider regarding care management and care coordination activities outlined in this encounter and have reviewed this encounter including documentation in the note and care plan. I am certifying that I agree with the content of this note and encounter as supervising physician.

## 2020-04-21 ENCOUNTER — Other Ambulatory Visit: Payer: Self-pay | Admitting: Family Medicine

## 2020-04-28 ENCOUNTER — Ambulatory Visit (HOSPITAL_COMMUNITY)
Admission: RE | Admit: 2020-04-28 | Discharge: 2020-04-28 | Disposition: A | Discharge status: Home / Self Care | Payer: Medicare HMO | Source: Ambulatory Visit | Attending: Family Medicine | Admitting: Family Medicine

## 2020-04-28 ENCOUNTER — Other Ambulatory Visit: Payer: Self-pay

## 2020-04-28 DIAGNOSIS — Z1231 Encounter for screening mammogram for malignant neoplasm of breast: Secondary | ICD-10-CM | POA: Diagnosis not present

## 2020-05-12 ENCOUNTER — Other Ambulatory Visit: Payer: Self-pay | Admitting: Family Medicine

## 2020-05-24 NOTE — Chronic Care Management (AMB) (Signed)
Chronic Care Management Pharmacy  Name: KAURI GARSON  MRN: [id number] DOB: Aug 11, 1940  Chief Complaint/ HPI  Chucky May,  80 y.o. , female presents for their Follow-Up CCM visit with the clinical pharmacist In office.  PCP : Susy Frizzle, MD  Their chronic conditions include: hypertension, hyperlipidemia, rheumatoid arthritis.  Office Visits: 12/01/2019 (Pickard) - Bloating believed to be chronic constipation.  Given samples of Linzess 26mg.  PCP comfortable with systolic BP of 1202or less.  07/08/2019 (Pickard) - chronic constipation, trial of Amitiza 83m twice daily, BP elevated asked to check twice daily for 3 to 4 days and report, may increase doxazosin  05/30/2019 (Pickard) - abnormal urine odor, UA suggests UTI, and push fluids, elevated BP again recommended patient to go home and check and can take an extra tablet of doxazosin  Consult Visit:  08/28/2019 (SMarlou PorchCardio) - chest pain, blood pressure slightly elevated, goal LDL < 70, encouraged weight loss and leg exercises when sitting on couch  Medications: Outpatient Encounter Medications as of 05/28/2020  Medication Sig   ACCU-CHEK AVIVA PLUS test strip TEST BLOOD SUGAR EVERY DAY   Accu-Chek Softclix Lancets lancets CHECK BLOOD SUGAR EVERY MORNING   amLODipine (NORVASC) 10 MG tablet TAKE 1 TABLET EVERY DAY   Ascorbic Acid (VITAMIN C) 1000 MG tablet Take 1,000 mg by mouth daily.   aspirin 81 MG chewable tablet Chew 81 mg by mouth daily.   atorvastatin (LIPITOR) 80 MG tablet TAKE 1/2 TABLET EVERY DAY   Blood Glucose Monitoring Suppl (ACCU-CHEK AVIVA PLUS) w/Device KIT USE AS DIRECTED TO CHECK BLOOD SUGAR   Cholecalciferol (VITAMIN D) 2000 UNITS tablet Take 1,000 Units by mouth daily.    Coenzyme Q10 (CO Q-10) 100 MG CAPS Take by mouth.   doxazosin (CARDURA) 1 MG tablet TAKE 1 TABLET (1 MG TOTAL) BY MOUTH DAILY.   fenofibrate 54 MG tablet TAKE 1 TABLET EVERY DAY   Lancet Devices  (ACCU-CHEK SOFTCLIX) lancets Check BS qd - DX- E11.9   losartan-hydrochlorothiazide (HYZAAR) 100-25 MG tablet TAKE 1 TABLET EVERY DAY   metoprolol succinate (TOPROL-XL) 50 MG 24 hr tablet TAKE 1 TABLET EVERY DAY WITH OR IMMEDIATELY FOLLOWING A MEAL.   naproxen sodium (ALEVE) 220 MG tablet Take 220 mg by mouth daily as needed (knee pain).    Omega-3 Fatty Acids (FISH OIL CONCENTRATE PO) Take by mouth.   Polyethyl Glycol-Propyl Glycol 0.4-0.3 % SOLN Apply to eye.   polyethylene glycol (MIRALAX / GLYCOLAX) packet Take 17 g by mouth daily.   pyridOXINE (VITAMIN B-6) 100 MG tablet Take 100 mg by mouth daily as needed.   No facility-administered encounter medications on file as of 05/28/2020.     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    Difficulty of Paying Living Expenses: Not very hard    Goals Addressed            This Visit's Progress    Pharmacy Care Plan:       CARE PLAN ENTRY (see longitudinal plan of care for additional care plan information)  Current Barriers:   Chronic Disease Management support, education, and care coordination needs related to Hypertension, Hyperlipidemia, and rheumatoid arthritis   Hypertension BP Readings from Last 3 Encounters:  12/01/19 (!) 150/60  08/28/19 (!) 150/66  07/08/19 (!) 172/80    Pharmacist Clinical Goal(s): o Over the next 120 days, patient will work with PharmD and providers to maintain BP goal <140/90  Current regimen:  o Amlodipine 1021maily  o Losartan-HCTZ 100-37m daily o Metoprolol Succinate 524mdaily o Doxazosin 7m59maily  Interventions: o Reviewed home blood pressure readings o Discussed timing of blood pressure medications  Patient self care activities - Over the next 180 days, patient will: o Check BP daily, document, and provide at future appointments o Ensure daily salt intake < 2300 mg/day o Contact providers if BP consistently > 150/90.  Hyperlipidemia Lab Results   Component Value Date/Time   [facility] 12/01/2019 08:37 AM    Pharmacist Clinical Goal(s): o Over the next 120 days, patient will work with PharmD and providers to achieve LDL goal < 70  Current regimen:  o Atorvastatin 25m35mke one-half tablet daily  Interventions: o Reviewed most recent lipid panel o Congratulated for being at goal o Discussed continued dietary modifications  Patient self care activities - Over the next 120 days, patient will: o Continue to focus on medication adherence by pill count o Work on decreasing high fat content foods  Rheumatoid Arthritis  Pharmacist Clinical Goal(s) o Over the next 120 days, patient will work with PharmD and providers to optimize medication and minimize symptoms related to RA.  Current regimen:  o Aleve 220 mg as needed  Interventions: o Discussed current pain and frequency of NSAID use o Discussed current exercise plan  Patient self care activities - Over the next 120 days, patient will: o Continue to exercise using weights and step machine o Contact providers with any increase in pain   Please see past updates related to this goal by clicking on the "Past Updates" button in the selected goal         Hypertension     Office blood pressures are  BP Readings from Last 3 Encounters:  12/01/19 (!) 150/60  08/28/19 (!) 150/66  07/08/19 (!) 172/80    Patient has failed these meds in the past: atenolol 50mg38mtient checks BP at home daily  Patient home BP readings are ranging: 142/79 P 68 this morning, reports her average has been 130s/70-80s  Patient is currently controlled based on home BP on the following medications:  Amlodipine 10mg 68martan-HCTZ 100-25mg  25mprolol Succinate 50mg da87m Doxazosin 7mg dail50mWe discussed:  No medication changes since last visit  Patient reports adherence with all medications  She denies dizziness, headaches  PCP ok with systolic < 150, pati536 reports she has  not seen any above this when monitoring at home  Plan  Continue current medications, monitor BP daily, notify providers if consistently > 150/90.      Hyperlipidemia   LDL goal < 70  Lipid Panel     Component Value Date/Time   CHOL 164 12/01/2019 0837   TRIG 54 12/01/2019 0837   HDL 61 12/01/2019 0837   LDLCALC 89 12/01/2019 0837    Hepatic Function Latest Ref Rng & Units 12/01/2019 06/19/2019 11/19/2018  Total Protein 6.1 - 8.1 g/dL 7.2 7.2 7.7  Albumin 3.6 - 5.1 g/dL - - -  AST 10 - 35 U/L '18 19 20  ' ALT 6 - 29 U/L '16 18 17  ' Alk Phosphatase 33 - 130 U/L - - -  Total Bilirubin 0.2 - 1.2 mg/dL 0.4 0.5 0.3  Bilirubin, Direct 0.0 - 0.3 mg/dL - - -     The 10-year ASCVD risk score (Goff DC Mikey Bussinget al., 2013) is: 39.3%   Values used to calculate the score:     Age: 71 years 73  Sex: Female  Is Non-Hispanic African American: Yes     Diabetic: Yes     Tobacco smoker: No     Systolic Blood Pressure: 503 mmHg     Is BP treated: Yes     HDL Cholesterol: 61 mg/dL     Total Cholesterol: 164 mg/dL   Patient has failed these meds in past: fenofibrate 79m Patient is currently uncontrolled on the following medications:   Atorvastatin 815m- one-half tablet po daily  Fenofibrate 5482mne tablet daily  We discussed:  Cholesterol has improved, patient now at goal  Still denies myalgias, medications remain the same and patient is adherent to all meds  She is still limiting her meats due to cost and because she knows it is better for her cholesterol  Plan  Continue current medications  Continue to limit hight fat content foods  RA   Patient has failed these meds in past: diclofenac, Mobic Patient is currently controlled on the following medications:   Aleve 220m71mn  Patient reports her pain overall is controlled, very rarely has to take Aleve for arthritis.    Plan  Continue current medications  Vaccines   Reviewed and discussed patient's vaccination history.     Immunization History  Administered Date(s) Administered   Fluad Quad(high Dose 65+) 01/31/2019, 01/28/2020   Influenza Whole 01/17/2010, 02/07/2012   Influenza, High Dose Seasonal PF 02/13/2017   Influenza,inj,Quad PF,6+ Mos 02/04/2013, 02/11/2014, 02/16/2015, 01/18/2016, 01/25/2018   PFIZER SARS-COV-2 Vaccination 07/12/2019, 08/09/2019   Pneumococcal Conjugate-13 05/28/2014   Pneumococcal Polysaccharide-23 04/25/2005, 02/11/2013   Td 10/14/1998   Tdap 12/06/2017   Patient also reports she had both COVID-19 shots in Feb/march.   Plan  Recommended patient receive shingles vaccine in office.   Medication Management    Miscellaneous medications: None Rx  OTC's:  o Fish oil o CoQ10 o Vitamin C o ASA 81mg52miralax - Reports she is more regular now, did a trial of Linzess but is now out of the samples and did not continue.  She takes Miralax a few times per week and so far this has kept her regular.  [facility]  [phone] reports using weekly pill box method to organize medications and promote adherence.  Patient denies missed doses of medication.   ChrisBeverly MilchrmD Clinical Pharmacist [phone]

## 2020-05-25 ENCOUNTER — Telehealth: Payer: Self-pay

## 2020-05-25 NOTE — Telephone Encounter (Signed)
[name] was confused on her appointments for the month of January. Those appointments were explained to [name].

## 2020-05-28 ENCOUNTER — Ambulatory Visit: Payer: Self-pay

## 2020-05-28 DIAGNOSIS — E7800 Pure hypercholesterolemia, unspecified: Secondary | ICD-10-CM

## 2020-05-28 DIAGNOSIS — I159 Secondary hypertension, unspecified: Secondary | ICD-10-CM

## 2020-05-28 NOTE — Patient Instructions (Addendum)
Visit Information  Goals Addressed            This Visit's Progress   . Pharmacy Care Plan:       CARE PLAN ENTRY (see longitudinal plan of care for additional care plan information)  Current Barriers:  . Chronic Disease Management support, education, and care coordination needs related to Hypertension, Hyperlipidemia, and rheumatoid arthritis   Hypertension BP Readings from Last 3 Encounters:  12/01/19 (!) 150/60  08/28/19 (!) 150/66  07/08/19 (!) 172/80   . Pharmacist Clinical Goal(s): o Over the next 120 days, patient will work with PharmD and providers to maintain BP goal <140/90 . Current regimen:  o Amlodipine 10mg  daily o Losartan-HCTZ 100-25mg  daily o Metoprolol Succinate 50mg  daily o Doxazosin 1mg  daily . Interventions: o Reviewed home blood pressure readings o Discussed timing of blood pressure medications . Patient self care activities - Over the next 180 days, patient will: o Check BP daily, document, and provide at future appointments o Ensure daily salt intake < 2300 mg/day o Contact providers if BP consistently > 150/90.  Hyperlipidemia Lab Results  Component Value Date/Time   LDLCALC 89 12/01/2019 08:37 AM   . Pharmacist Clinical Goal(s): o Over the next 120 days, patient will work with PharmD and providers to achieve LDL goal < 70 . Current regimen:  o Atorvastatin 80mg  take one-half tablet daily . Interventions: o Reviewed most recent lipid panel o Congratulated for being at goal o Discussed continued dietary modifications . Patient self care activities - Over the next 120 days, patient will: o Continue to focus on medication adherence by pill count o Work on decreasing high fat content foods  Rheumatoid Arthritis . Pharmacist Clinical Goal(s) o Over the next 120 days, patient will work with PharmD and providers to optimize medication and minimize symptoms related to RA. . Current regimen:  o Aleve 220 mg as  needed . Interventions: o Discussed current pain and frequency of NSAID use o Discussed current exercise plan . Patient self care activities - Over the next 120 days, patient will: o Continue to exercise using weights and step machine o Contact providers with any increase in pain   Please see past updates related to this goal by clicking on the "Past Updates" button in the selected goal         The patient verbalized understanding of instructions, educational materials, and care plan provided today and agreed to receive a mailed copy of patient instructions, educational materials, and care plan.   Telephone follow up appointment with pharmacy team member scheduled for: 4 months  Edythe Clarity, [facility]  Hypertension, Adult Hypertension is another name for high blood pressure. High blood pressure forces your heart to work harder to pump blood. This can cause problems over time. There are two numbers in a blood pressure reading. There is a top number (systolic) over a bottom number (diastolic). It is best to have a blood pressure that is below 120/80. Healthy choices can help lower your blood pressure, or you may need medicine to help lower it. What are the causes? The cause of this condition is not known. Some conditions may be related to high blood pressure. What increases the risk?  Smoking.  Having type 2 diabetes mellitus, high cholesterol, or both.  Not getting enough exercise or physical activity.  Being overweight.  Having too much fat, sugar, calories, or salt (sodium) in your diet.  Drinking too much alcohol.  Having long-term (chronic) kidney disease.  Having  a family history of high blood pressure.  Age. Risk increases with age.  Race. You may be at higher risk if you are African American.  Gender. Men are at higher risk than women before age 54. After age 95, women are at higher risk than men.  Having obstructive sleep apnea.  Stress. What are the signs or  symptoms?  High blood pressure may not cause symptoms. Very high blood pressure (hypertensive crisis) may cause: ? Headache. ? Feelings of worry or nervousness (anxiety). ? Shortness of breath. ? Nosebleed. ? A feeling of being sick to your stomach (nausea). ? Throwing up (vomiting). ? Changes in how you see. ? Very bad chest pain. ? Seizures. How is this treated?  This condition is treated by making healthy lifestyle changes, such as: ? Eating healthy foods. ? Exercising more. ? Drinking less alcohol.  Your health care provider may prescribe medicine if lifestyle changes are not enough to get your blood pressure under control, and if: ? Your top number is above 130. ? Your bottom number is above 80.  Your personal target blood pressure may vary. Follow these instructions at home: Eating and drinking  If told, follow the DASH eating plan. To follow this plan: ? Fill one half of your plate at each meal with fruits and vegetables. ? Fill one fourth of your plate at each meal with whole grains. Whole grains include whole-wheat pasta, brown rice, and whole-grain bread. ? Eat or drink low-fat dairy products, such as skim milk or low-fat yogurt. ? Fill one fourth of your plate at each meal with low-fat (lean) proteins. Low-fat proteins include fish, chicken without skin, eggs, beans, and tofu. ? Avoid fatty meat, cured and processed meat, or chicken with skin. ? Avoid pre-made or processed food.  Eat less than 1,500 mg of salt each day.  Do not drink alcohol if: ? Your doctor tells you not to drink. ? You are pregnant, may be pregnant, or are planning to become pregnant.  If you drink alcohol: ? Limit how much you use to:  0-1 drink a day for women.  0-2 drinks a day for men. ? Be aware of how much alcohol is in your drink. In the [place] one drink equals one 12 oz bottle of beer (355 mL), one 5 oz glass of wine (148 mL), or one 1 oz glass of hard liquor (44 mL).    Lifestyle  Work with your doctor to stay at a healthy weight or to lose weight. Ask your doctor what the best weight is for you.  Get at least 30 minutes of exercise most days of the week. This may include walking, swimming, or biking.  Get at least 30 minutes of exercise that strengthens your muscles (resistance exercise) at least 3 days a week. This may include lifting weights or doing Pilates.  Do not use any products that contain nicotine or tobacco, such as cigarettes, e-cigarettes, and chewing tobacco. If you need help quitting, ask your doctor.  Check your blood pressure at home as told by your doctor.  Keep all follow-up visits as told by your doctor. This is important.   Medicines  Take over-the-counter and prescription medicines only as told by your doctor. Follow directions carefully.  Do not skip doses of blood pressure medicine. The medicine does not work as well if you skip doses. Skipping doses also puts you at risk for problems.  Ask your doctor about side effects or reactions to medicines that you  should watch for. Contact a doctor if you:  Think you are having a reaction to the medicine you are taking.  Have headaches that keep coming back (recurring).  Feel dizzy.  Have swelling in your ankles.  Have trouble with your vision. Get help right away if you:  Get a very bad headache.  Start to feel mixed up (confused).  Feel weak or numb.  Feel faint.  Have very bad pain in your: ? Chest. ? Belly (abdomen).  Throw up more than once.  Have trouble breathing. Summary  Hypertension is another name for high blood pressure.  High blood pressure forces your heart to work harder to pump blood.  For most people, a normal blood pressure is less than 120/80.  Making healthy choices can help lower blood pressure. If your blood pressure does not get lower with healthy choices, you may need to take medicine. This information is not intended to replace  advice given to you by your health care provider. Make sure you discuss any questions you have with your health care provider. Document Revised: 01/09/2018 Document Reviewed: 01/09/2018 Elsevier Patient Education  2021 [employer].

## 2020-06-08 ENCOUNTER — Ambulatory Visit (INDEPENDENT_AMBULATORY_CARE_PROVIDER_SITE_OTHER): Payer: Medicare HMO | Admitting: Family Medicine

## 2020-06-08 ENCOUNTER — Other Ambulatory Visit: Payer: Self-pay

## 2020-06-08 ENCOUNTER — Encounter: Payer: Self-pay | Admitting: Family Medicine

## 2020-06-08 VITALS — BP 150/62 | HR 75 | Temp 97.9°F | Ht 64.0 in | Wt 251.0 lb

## 2020-06-08 DIAGNOSIS — R5382 Chronic fatigue, unspecified: Secondary | ICD-10-CM | POA: Diagnosis not present

## 2020-06-08 DIAGNOSIS — I10 Essential (primary) hypertension: Secondary | ICD-10-CM

## 2020-06-08 DIAGNOSIS — R6889 Other general symptoms and signs: Secondary | ICD-10-CM | POA: Diagnosis not present

## 2020-06-08 DIAGNOSIS — R7303 Prediabetes: Secondary | ICD-10-CM

## 2020-06-08 DIAGNOSIS — D369 Benign neoplasm, unspecified site: Secondary | ICD-10-CM

## 2020-06-08 DIAGNOSIS — R011 Cardiac murmur, unspecified: Secondary | ICD-10-CM

## 2020-06-08 DIAGNOSIS — E7800 Pure hypercholesterolemia, unspecified: Secondary | ICD-10-CM

## 2020-06-08 DIAGNOSIS — Z0001 Encounter for general adult medical examination with abnormal findings: Secondary | ICD-10-CM | POA: Diagnosis not present

## 2020-06-08 DIAGNOSIS — Z0000 Encounter for general adult medical examination without abnormal findings: Secondary | ICD-10-CM

## 2020-06-08 NOTE — Progress Notes (Signed)
Subjective:    Patient ID: [name], female    DOB: 1940/06/21, 80 y.o.   MRN: [id number] Patient is a very pleasant 80 year old African-American female here today for complete physical exam.  She does complain of feeling tired all the time.  She reports no energy and no stamina.  She states that she is not sleeping well.  She will typically go to bed around 7:00 and then will wake up around 2 in the morning and then toss and turn for the rest of the evening.  She denies any chest pain or shortness of breath or dyspnea on exertion.  She denies any memory loss.  She denies any falls.  She denies any depression.  She does complain of pain which is chronic in her right knee.  Patient has had all 3 doses of her COVID vaccine along with a flu shot.  The remainder of her immunizations are listed below: Immunization History  Administered Date(s) Administered  . Fluad Quad(high Dose 65+) 01/31/2019, 01/28/2020  . Influenza Whole 01/17/2010, 02/07/2012  . Influenza, High Dose Seasonal PF 02/13/2017  . Influenza,inj,Quad PF,6+ Mos 02/04/2013, 02/11/2014, 02/16/2015, 01/18/2016, 01/25/2018  . PFIZER(Purple Top)SARS-COV-2 Vaccination 07/12/2019, 08/09/2019, 03/05/2020  . Pneumococcal Conjugate-13 05/28/2014  . Pneumococcal Polysaccharide-23 04/25/2005, 02/11/2013  . Td 10/14/1998  . Tdap 12/06/2017   I did recommend the shingles vaccine.  Her mammogram was performed in December and was normal.  Her last colonoscopy was performed in 2016.  This showed several tubular adenomas and her GI doctor had recommended a repeat colonoscopy after 3 years however the patient was not contacted for this per her report.  Therefore she is overdue for a colonoscopy which we will schedule her for.  She does not require Pap smear.  Physical exam today significant for a 3/6 to 4/6 systolic ejection murmur heard over the aortic valve  Past Medical History:  Diagnosis Date  . Arthritis    rheumatoid arthritis  .  Brain tumor ([place])   . Chest pain   . Colon polyps   . Dyspepsia   . Gallbladder sludge   . Hypercholesterolemia   . Hypertension   . Left atrial dilatation    Mild  . Mild aortic sclerosis    without stenosis  . Mitral annular calcification    Mild  . Mitral valve disorder    Mildly thickened mitral valve, but with normal leaflet but with normal leaflet   . 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 lt eye implant  1998  . rt knee surg  1991  . TUBAL LIGATION  1981   Current Outpatient Medications on File Prior to Visit  Medication Sig Dispense Refill  . ACCU-CHEK AVIVA PLUS test strip TEST BLOOD SUGAR EVERY DAY 100 strip 3  . Accu-Chek Softclix Lancets lancets CHECK BLOOD SUGAR EVERY MORNING 100 each 3  . amLODipine (NORVASC) 10 MG tablet TAKE 1 TABLET EVERY DAY 90 tablet 0  . Ascorbic Acid (VITAMIN C) 1000 MG tablet Take 1,000 mg by mouth daily.    Marland Kitchen aspirin 81 MG chewable tablet Chew 81 mg by mouth daily.    Marland Kitchen atorvastatin (LIPITOR) 80 MG tablet TAKE 1/2 TABLET EVERY DAY 45 tablet 3  . Blood Glucose Monitoring Suppl (ACCU-CHEK AVIVA PLUS) w/Device KIT USE AS DIRECTED TO CHECK BLOOD SUGAR 1 kit 0  . Cholecalciferol (VITAMIN D) 2000 UNITS  tablet Take 1,000 Units by mouth daily.     . Coenzyme Q10 (CO Q-10) 100 MG CAPS Take by mouth.    . doxazosin (CARDURA) 1 MG tablet TAKE 1 TABLET (1 MG TOTAL) BY MOUTH DAILY. 90 tablet 3  . fenofibrate 54 MG tablet TAKE 1 TABLET EVERY DAY 90 tablet 3  . Lancet Devices (ACCU-CHEK SOFTCLIX) lancets Check BS qd - DX- E11.9 1 each 5  . losartan-hydrochlorothiazide (HYZAAR) 100-25 MG tablet TAKE 1 TABLET EVERY DAY 90 tablet 1  . metoprolol succinate (TOPROL-XL) 50 MG 24 hr tablet TAKE 1 TABLET EVERY DAY WITH OR IMMEDIATELY FOLLOWING A MEAL. 90 tablet 0  . Omega-3 Fatty Acids (FISH OIL CONCENTRATE PO) Take by mouth.    Vladimir Faster Glycol-Propyl Glycol 0.4-0.3 %  SOLN Apply to eye.    . polyethylene glycol (MIRALAX / GLYCOLAX) packet Take 17 g by mouth daily.    Marland Kitchen pyridOXINE (VITAMIN B-6) 100 MG tablet Take 100 mg by mouth daily as needed.    . naproxen sodium (ALEVE) 220 MG tablet Take 220 mg by mouth daily as needed (knee pain).  (Patient not taking: Reported on 06/08/2020)     No current facility-administered medications on file prior to visit.   Allergies  Allergen Reactions  . Vytorin [Ezetimibe-Simvastatin] Other (See Comments)    myalgia   Social History   Socioeconomic History  . Marital status: Widowed    Spouse name: Not on file  . Number of children: Not on file  . Years of education: Not on file  . Highest education level: Not on file  Occupational History  . Not on file  Tobacco Use  . Smoking status: Former Smoker    Quit date: 09/07/1973    Years since quitting: 46.7  . Smokeless tobacco: Never Used  Vaping Use  . Vaping Use: Never used  Substance and Sexual Activity  . Alcohol use: No  . Drug use: No  . Sexual activity: Not Currently  Other Topics Concern  . Not on file  Social History Narrative  . Not on file   Social Determinants of Health   Financial Resource Strain: Low Risk   . Difficulty of Paying Living Expenses: Not very hard  Food Insecurity: Not on file  Transportation Needs: Not on file  Physical Activity: Not on file  Stress: Not on file  Social Connections: Not on file  Intimate Partner Violence: Not on file    Family History  Problem Relation Age of Onset  . Stroke Paternal Grandmother   . Diabetes Mother   . Diabetes Sister   . Diabetes Grandchild      Review of Systems  All other systems reviewed and are negative.      Objective:   Physical Exam Vitals reviewed.  Constitutional:      General: She is not in acute distress.    Appearance: Normal appearance. She is well-developed. She is obese. She is not ill-appearing, toxic-appearing or diaphoretic.  HENT:     Head:  Normocephalic and atraumatic.     Right Ear: Tympanic membrane, ear canal and external ear normal. There is no impacted cerumen.     Left Ear: Tympanic membrane, ear canal and external ear normal. There is no impacted cerumen.     Nose: Nose normal. No congestion or rhinorrhea.     Mouth/Throat:     Mouth: Mucous membranes are moist.     Pharynx: Oropharynx is clear. No oropharyngeal exudate.  Eyes:  General: No scleral icterus.       Right eye: No discharge.        Left eye: No discharge.     Extraocular Movements: Extraocular movements intact.     Conjunctiva/sclera: Conjunctivae normal.     Pupils: Pupils are equal, round, and reactive to light.  Neck:     Thyroid: No thyromegaly.     Vascular: No JVD.  Cardiovascular:     Rate and Rhythm: Normal rate and regular rhythm.     Pulses: Normal pulses.     Heart sounds: Murmur heard.  No friction rub. No gallop.   Pulmonary:     Effort: Pulmonary effort is normal. No respiratory distress.     Breath sounds: Normal breath sounds. No stridor. No wheezing, rhonchi or rales.  Chest:     Chest wall: No tenderness.  Abdominal:     General: Bowel sounds are decreased. There is no distension.     Palpations: Abdomen is soft. There is no mass.     Tenderness: There is no abdominal tenderness. There is no guarding or rebound.     Hernia: No hernia is present.  Musculoskeletal:     Cervical back: Neck supple. No rigidity or tenderness.     Right lower leg: No edema.     Left lower leg: No edema.  Lymphadenopathy:     Cervical: No cervical adenopathy.  Skin:    General: Skin is warm.     Coloration: Skin is not jaundiced or pale.     Findings: No bruising, erythema, lesion or rash.  Neurological:     General: No focal deficit present.     Mental Status: She is alert and oriented to person, place, and time. Mental status is at baseline.     Cranial Nerves: No cranial nerve deficit.     Sensory: No sensory deficit.     Motor: No  weakness.     Coordination: Coordination normal.     Gait: Gait normal.     Deep Tendon Reflexes: Reflexes normal.  Psychiatric:        Mood and Affect: Mood normal.        Behavior: Behavior normal.        Thought Content: Thought content normal.        Judgment: Judgment normal.           Assessment & Plan:  Essential hypertension  Pure hypercholesterolemia - Plan: CBC with Differential/Platelet, COMPLETE METABOLIC PANEL WITH GFR, Lipid panel, Hemoglobin A1c  Prediabetes - Plan: Hemoglobin A1c  Chronic fatigue - Plan: Vitamin B12, TSH  Tubular adenoma - Plan: Ambulatory referral to Gastroenterology  Murmur, cardiac - Plan: ECHOCARDIOGRAM COMPLETE  General medical exam  Immunizations are up-to-date except for shingles vaccine which I recommended.  Mammogram is up-to-date.  She does not require a Pap smear.  I will consult GI about a colonoscopy.  I am concerned about the murmur which is quite loud today on exam.  I am concerned that she has aortic stenosis which could be contributing to her fatigue.  Therefore I will check an echocardiogram.  Her blood pressure today is elevated however she is been checking her blood pressure at home frequently over the last 2 months and her blood pressures at home are between 120 and 135/70-85.  Given her history of prediabetes I will check an A1c.  Given her fatigue I will check a TSH and a B12.  Given her history of hyperlipidemia I will check a  fasting lipid panel.  Given her fatigue I will check a CBC as well.  She denies any falls or depression or memory loss.

## 2020-06-09 LAB — COMPLETE METABOLIC PANEL WITH GFR
AG Ratio: 1.3 (calc) (ref 1.0–2.5)
ALT: 17 U/L (ref 6–29)
AST: 21 U/L (ref 10–35)
Albumin: 4.2 g/dL (ref 3.6–5.1)
Alkaline phosphatase (APISO): 39 U/L (ref 37–153)
BUN: 17 mg/dL (ref 7–25)
CO2: 27 mmol/L (ref 20–32)
Calcium: 10.3 mg/dL (ref 8.6–10.4)
Chloride: 107 mmol/L (ref 98–110)
Creat: 0.91 mg/dL (ref 0.60–0.93)
GFR, Est African American: 70 mL/min/{1.73_m2} (ref >=60)
GFR, Est Non African American: 60 mL/min/{1.73_m2} (ref >=60)
Globulin: 3.2 g/dL (calc) (ref 1.9–3.7)
Glucose, Bld: 102 mg/dL — ABNORMAL HIGH (ref 65–99)
Potassium: 3.7 mmol/L (ref 3.5–5.3)
Sodium: 143 mmol/L (ref 135–146)
Total Bilirubin: 0.5 mg/dL (ref 0.2–1.2)
Total Protein: 7.4 g/dL (ref 6.1–8.1)

## 2020-06-09 LAB — CBC WITH DIFFERENTIAL/PLATELET
Absolute Monocytes: 546 cells/uL (ref 200–950)
Basophils Absolute: 37 cells/uL (ref 0–200)
Basophils Relative: 0.6 %
Eosinophils Absolute: 192 cells/uL (ref 15–500)
Eosinophils Relative: 3.1 %
HCT: 36.7 % (ref 35.0–45.0)
Hemoglobin: 12.3 g/dL (ref 11.7–15.5)
Lymphs Abs: 2406 cells/uL (ref 850–3900)
MCH: 26.5 pg — ABNORMAL LOW (ref 27.0–33.0)
MCHC: 33.5 g/dL (ref 32.0–36.0)
MCV: 78.9 fL — ABNORMAL LOW (ref 80.0–100.0)
MPV: 10.5 fL (ref 7.5–12.5)
Monocytes Relative: 8.8 %
Neutro Abs: 3019 cells/uL (ref 1500–7800)
Neutrophils Relative %: 48.7 %
Platelets: 299 10*3/uL (ref 140–400)
RBC: 4.65 10*6/uL (ref 3.80–5.10)
RDW: 14.9 % (ref 11.0–15.0)
Total Lymphocyte: 38.8 %
WBC: 6.2 10*3/uL (ref 3.8–10.8)

## 2020-06-09 LAB — LIPID PANEL
Cholesterol: 170 mg/dL (ref <200)
HDL: 60 mg/dL (ref >=50)
LDL Cholesterol (Calc): 95 mg/dL (calc)
Non-HDL Cholesterol (Calc): 110 mg/dL (calc) (ref <130)
Total CHOL/HDL Ratio: 2.8 (calc) (ref <5.0)
Triglycerides: 67 mg/dL (ref <150)

## 2020-06-09 LAB — TSH: TSH: 5.2 mIU/L — ABNORMAL HIGH (ref 0.40–4.50)

## 2020-06-09 LAB — VITAMIN B12: Vitamin B-12: 289 pg/mL (ref 200–1100)

## 2020-06-09 LAB — HEMOGLOBIN A1C
Hgb A1c MFr Bld: 6 % of total Hgb — ABNORMAL HIGH (ref <5.7)
Mean Plasma Glucose: 126 mg/dL
eAG (mmol/L): 7 mmol/L

## 2020-06-10 ENCOUNTER — Encounter: Payer: Self-pay | Admitting: *Deleted

## 2020-06-11 ENCOUNTER — Telehealth: Payer: Self-pay | Admitting: Pharmacist

## 2020-06-11 NOTE — Progress Notes (Signed)
Checking the patient's chart for any medication changes since their last CCM visit. The patient had an OV with their PCP on 06-08-2020. There were no medication changes made at the time of the visit.  Fanny Skates, [place] Pharmacist Assistant [phone]

## 2020-06-24 ENCOUNTER — Other Ambulatory Visit: Payer: Self-pay | Admitting: Family Medicine

## 2020-07-08 ENCOUNTER — Other Ambulatory Visit: Payer: Self-pay | Admitting: *Deleted

## 2020-07-08 MED ORDER — METOPROLOL SUCCINATE ER 50 MG PO TB24: ORAL_TABLET | ORAL | 3 refills | Status: DC

## 2020-07-08 MED ORDER — AMLODIPINE BESYLATE 10 MG PO TABS: 10.0000 mg | ORAL_TABLET | Freq: Every day | ORAL | 3 refills | Status: DC

## 2020-07-14 ENCOUNTER — Ambulatory Visit (HOSPITAL_COMMUNITY): Payer: Medicare HMO | Attending: Cardiology

## 2020-07-14 ENCOUNTER — Other Ambulatory Visit: Payer: Self-pay

## 2020-07-14 DIAGNOSIS — R011 Cardiac murmur, unspecified: Secondary | ICD-10-CM | POA: Insufficient documentation

## 2020-07-14 LAB — ECHOCARDIOGRAM COMPLETE
Area-P 1/2: 2.95 cm2
S' Lateral: 2.5 cm

## 2020-07-15 ENCOUNTER — Other Ambulatory Visit: Payer: Self-pay | Admitting: *Deleted

## 2020-07-27 ENCOUNTER — Telehealth: Payer: Self-pay | Admitting: Pharmacist

## 2020-07-27 NOTE — Progress Notes (Addendum)
° ° °Chronic Care Management °Pharmacy Assistant  ° °Name: [name]  MRN: [id number] DOB: 06/21/1940 ° °Reason for Encounter: Disease State For  HTN. °  °Conditions to be addressed/monitored:  °hypertension, hyperlipidemia, rheumatoid arthritis. ° °Recent office visits:  °None since 06/11/20 ° °Recent consult visits:  °None since 06/11/20 ° °Hospital visits:  °None in previous 6 months ° °Medications: °Outpatient Encounter Medications as of 07/27/2020  °Medication Sig  ° ACCU-CHEK AVIVA PLUS test strip TEST BLOOD SUGAR EVERY DAY  ° Accu-Chek Softclix Lancets lancets CHECK BLOOD SUGAR EVERY MORNING  ° amLODipine (NORVASC) 10 MG tablet Take 1 tablet (10 mg total) by mouth daily.  ° Ascorbic Acid (VITAMIN C) 1000 MG tablet Take 1,000 mg by mouth daily.  ° aspirin 81 MG chewable tablet Chew 81 mg by mouth daily.  ° atorvastatin (LIPITOR) 80 MG tablet TAKE 1/2 TABLET EVERY DAY  ° Blood Glucose Monitoring Suppl (ACCU-CHEK AVIVA PLUS) w/Device KIT USE AS DIRECTED TO CHECK BLOOD SUGAR  ° Cholecalciferol (VITAMIN D) 2000 UNITS tablet Take 1,000 Units by mouth daily.   ° Coenzyme Q10 (CO Q-10) 100 MG CAPS Take by mouth.  ° doxazosin (CARDURA) 1 MG tablet TAKE 1 TABLET (1 MG TOTAL) BY MOUTH DAILY.  ° fenofibrate 54 MG tablet TAKE 1 TABLET EVERY DAY  ° Lancet Devices (ACCU-CHEK SOFTCLIX) lancets Check BS qd - DX- E11.9  ° losartan-hydrochlorothiazide (HYZAAR) 100-25 MG tablet TAKE 1 TABLET EVERY DAY  ° metoprolol succinate (TOPROL-XL) 50 MG 24 hr tablet TAKE 1 TABLET EVERY DAY WITH OR IMMEDIATELY FOLLOWING A MEAL.  ° naproxen sodium (ALEVE) 220 MG tablet Take 220 mg by mouth daily as needed (knee pain).  (Patient not taking: Reported on 06/08/2020)  ° Omega-3 Fatty Acids (FISH OIL CONCENTRATE PO) Take by mouth.  ° Polyethyl Glycol-Propyl Glycol 0.4-0.3 % SOLN Apply to eye.  ° polyethylene glycol (MIRALAX / GLYCOLAX) packet Take 17 g by mouth daily.  ° pyridOXINE (VITAMIN B-6) 100 MG tablet Take 100 mg by mouth daily as  needed.  ° °No facility-administered encounter medications on file as of 07/27/2020.  ° °Reviewed chart prior to disease state call. Spoke with patient regarding BP ° °Recent Office Vitals: °BP Readings from Last 3 Encounters:  °06/08/20 (!) 150/62  °12/01/19 (!) 150/60  °08/28/19 (!) 150/66  ° °Pulse Readings from Last 3 Encounters:  °06/08/20 75  °12/01/19 86  °08/28/19 68  °  °Wt Readings from Last 3 Encounters:  °06/08/20 251 lb (113.9 kg)  °12/01/19 257 lb (116.6 kg)  °08/28/19 257 lb (116.6 kg)  °  ° °Kidney Function °Lab Results  °Component Value Date/Time  ° CREATININE 0.91 06/08/2020 09:33 AM  ° CREATININE 0.84 12/01/2019 08:37 AM  ° GFRNONAA 60 06/08/2020 09:33 AM  ° GFRAA 70 06/08/2020 09:33 AM  ° ° °BMP Latest Ref Rng & Units 06/08/2020 12/01/2019 06/19/2019  °Glucose 65 - 99 mg/dL 102(H) 101(H) 106(H)  °BUN 7 - 25 mg/dL 17 23 14  °Creatinine 0.60 - 0.93 mg/dL 0.91 0.84 0.84  °BUN/Creat Ratio 6 - 22 (calc) NOT APPLICABLE NOT APPLICABLE NOT APPLICABLE  °Sodium 135 - 146 mmol/L 143 141 142  °Potassium 3.5 - 5.3 mmol/L 3.7 3.8 3.8  °Chloride 98 - 110 mmol/L 107 106 105  °CO2 20 - 32 mmol/L 27 29 28  °Calcium 8.6 - 10.4 mg/dL 10.3 9.7 10.2  ° ° °Current antihypertensive regimen:  °Amlodipine 10 mg daily °Losartan-HCTZ 100-25 mg daily °Metoprolol XL 50 mg daily °Doxazosin 1   mg at bedtime ° °How often are you checking your Blood Pressure? Patient stated she checks her blood pressure daily   ° °Current home BP readings: Patient stated she's been staying at her sisters and has checked it all week.  ° °What recent interventions/DTPs have been made by any provider to improve Blood Pressure control since last CPP Visit: None. ° °Any recent hospitalizations or ED visits since last visit with CPP? Patient stated no. ° °What diet changes have been made to improve Blood Pressure Control?  °Patient stated her appetite is not that good. She stated she does eats fruits but don't eat much vegetables. She stated she does eat  meats, chicken, steak, and pork chops. She stated she drinks a lot of water and sometimes she drinks juice, soda but always mix it with water.  ° °What exercise is being done to improve your Blood Pressure Control?  °Patient stated she does all of her house chores. She stated she still is to able drive and she gets out and does her own shopping. She stated she does sitting exercises.  ° °Adherence Review: °Is the patient currently on ACE/ARB medication?  Losartan-Hydrochlorothiazide 100-25 mg daily ° °Does the patient have >5 day gap between last estimated fill dates? Per misc rpts, yes. ° °Star Rating Drugs: Atorvastatin 80 mg 1/2 daily 05/24/20  90 DS 45 Quantity, Losartan-Hydrochlorothiazide 100-25 mg daily 04/12/20 90 DS ° °Patient stated she does not have any questions or concerns about her medication at this time.  ° ° °Follow-Up: Pharmacist Review ° °Veronica Mclemore, RMA °Clinical Pharmacist Assistant °[phone] ° °10 minutes spent in review, coordination, and documentation. ° °Reviewed by: °Christian Davis, PharmD °Clinical Pharmacist °[facility] °[phone] ° °

## 2020-07-29 ENCOUNTER — Other Ambulatory Visit: Payer: Self-pay | Admitting: Family Medicine

## 2020-08-09 ENCOUNTER — Encounter: Payer: Self-pay | Admitting: Family Medicine

## 2020-08-09 ENCOUNTER — Ambulatory Visit (INDEPENDENT_AMBULATORY_CARE_PROVIDER_SITE_OTHER): Payer: Medicare HMO | Admitting: Family Medicine

## 2020-08-09 ENCOUNTER — Other Ambulatory Visit: Payer: Self-pay

## 2020-08-09 VITALS — BP 136/70 | HR 80 | Temp 98.0°F | Resp 14 | Ht 64.0 in | Wt 251.0 lb

## 2020-08-09 DIAGNOSIS — R194 Change in bowel habit: Secondary | ICD-10-CM

## 2020-08-09 NOTE — Progress Notes (Signed)
Subjective:    Patient ID: [name], female    DOB: [birth year], 80 y.o.   MRN: [id number] Patient states that over the last week she is concerned because she has had bright orange stool.  She denies any abdominal pain.  She denies any diarrhea.  She denies any fevers or chills.  Specifically she denies any bright red blood.  She denies any melena.  She denies any constipation.  She states that she is felt fine.  However her stool has changed color and is now almost neon orange similar to a tangerine.  However in taking a thorough history I discover that she has been drinking these new peach sodas that are also similarly colored. Past Medical History:  Diagnosis Date  . Arthritis    rheumatoid arthritis  . Brain tumor ([place])   . Chest pain   . Colon polyps   . Dyspepsia   . Gallbladder sludge   . Hypercholesterolemia   . Hypertension   . Left atrial dilatation    Mild  . Mild aortic sclerosis    without stenosis  . Mitral annular calcification    Mild  . Mitral valve disorder    Mildly thickened mitral valve, but with normal leaflet but with normal leaflet   . 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 lt eye implant  1998  . rt knee surg  1991  . TUBAL LIGATION  1981   Current Outpatient Medications on File Prior to Visit  Medication Sig Dispense Refill  . ACCU-CHEK AVIVA PLUS test strip TEST BLOOD SUGAR EVERY DAY 100 strip 3  . Accu-Chek Softclix Lancets lancets CHECK BLOOD SUGAR EVERY MORNING 100 each 3  . amLODipine (NORVASC) 10 MG tablet Take 1 tablet (10 mg total) by mouth daily. 90 tablet 3  . Ascorbic Acid (VITAMIN C) 1000 MG tablet Take 1,000 mg by mouth daily.    Marland Kitchen aspirin 81 MG chewable tablet Chew 81 mg by mouth daily.    Marland Kitchen atorvastatin (LIPITOR) 80 MG tablet TAKE 1/2 TABLET EVERY DAY 45 tablet 3  . Blood Glucose Monitoring Suppl (ACCU-CHEK AVIVA PLUS) w/Device KIT USE  AS DIRECTED TO CHECK BLOOD SUGAR 1 kit 0  . Cholecalciferol (VITAMIN D) 2000 UNITS tablet Take 1,000 Units by mouth daily.     . Coenzyme Q10 (CO Q-10) 100 MG CAPS Take by mouth.    . doxazosin (CARDURA) 1 MG tablet TAKE 1 TABLET (1 MG TOTAL) BY MOUTH DAILY. 90 tablet 3  . fenofibrate 54 MG tablet TAKE 1 TABLET EVERY DAY 90 tablet 3  . Lancet Devices (ACCU-CHEK SOFTCLIX) lancets Check BS qd - DX- E11.9 1 each 5  . losartan-hydrochlorothiazide (HYZAAR) 100-25 MG tablet TAKE 1 TABLET EVERY DAY 90 tablet 1  . metoprolol succinate (TOPROL-XL) 50 MG 24 hr tablet TAKE 1 TABLET EVERY DAY WITH OR IMMEDIATELY FOLLOWING A MEAL. 90 tablet 3  . naproxen sodium (ALEVE) 220 MG tablet Take 220 mg by mouth daily as needed (knee pain).    . Omega-3 Fatty Acids (FISH OIL CONCENTRATE PO) Take by mouth.    Vladimir Faster Glycol-Propyl Glycol 0.4-0.3 % SOLN Apply to eye.    . polyethylene glycol (MIRALAX / GLYCOLAX) packet Take 17 g by mouth daily.    Marland Kitchen pyridOXINE (VITAMIN B-6) 100 MG tablet Take 100 mg by mouth daily as needed.  No current facility-administered medications on file prior to visit.   Allergies  Allergen Reactions  . Vytorin [Ezetimibe-Simvastatin] Other (See Comments)    myalgia   Social History   Socioeconomic History  . Marital status: Widowed    Spouse name: Not on file  . Number of children: Not on file  . Years of education: Not on file  . Highest education level: Not on file  Occupational History  . Not on file  Tobacco Use  . Smoking status: Former Smoker    Quit date: 09/07/1973    Years since quitting: 46.9  . Smokeless tobacco: Never Used  Vaping Use  . Vaping Use: Never used  Substance and Sexual Activity  . Alcohol use: No  . Drug use: No  . Sexual activity: Not Currently  Other Topics Concern  . Not on file  Social History Narrative  . Not on file   Social Determinants of Health   Financial Resource Strain: Low Risk   . Difficulty of Paying Living Expenses:  Not very hard  Food Insecurity: Not on file  Transportation Needs: Not on file  Physical Activity: Not on file  Stress: Not on file  Social Connections: Not on file  Intimate Partner Violence: Not on file    Family History  Problem Relation Age of Onset  . Stroke Paternal Grandmother   . Diabetes Mother   . Diabetes Sister   . Diabetes Grandchild      Review of Systems  All other systems reviewed and are negative.      Objective:   Physical Exam Vitals reviewed.  Constitutional:      General: She is not in acute distress.    Appearance: Normal appearance. She is well-developed. She is obese. She is not ill-appearing, toxic-appearing or diaphoretic.  HENT:     Head: Normocephalic and atraumatic.     Right Ear: Tympanic membrane, ear canal and external ear normal. There is no impacted cerumen.     Left Ear: Tympanic membrane, ear canal and external ear normal. There is no impacted cerumen.     Nose: Nose normal. No congestion or rhinorrhea.     Mouth/Throat:     Mouth: Mucous membranes are moist.     Pharynx: Oropharynx is clear. No oropharyngeal exudate.  Eyes:     General: No scleral icterus.       Right eye: No discharge.        Left eye: No discharge.     Extraocular Movements: Extraocular movements intact.     Conjunctiva/sclera: Conjunctivae normal.     Pupils: Pupils are equal, round, and reactive to light.  Neck:     Thyroid: No thyromegaly.     Vascular: No JVD.  Cardiovascular:     Rate and Rhythm: Normal rate and regular rhythm.     Pulses: Normal pulses.     Heart sounds: Murmur heard.  No friction rub. No gallop.   Pulmonary:     Effort: Pulmonary effort is normal. No respiratory distress.     Breath sounds: Normal breath sounds. No stridor. No wheezing, rhonchi or rales.  Chest:     Chest wall: No tenderness.  Abdominal:     General: Bowel sounds are decreased. There is no distension.     Palpations: Abdomen is soft. There is no mass.      Tenderness: There is no abdominal tenderness. There is no guarding or rebound.     Hernia: No hernia is present.  Musculoskeletal:  Cervical back: Neck supple. No rigidity or tenderness.     Right lower leg: No edema.     Left lower leg: No edema.  Lymphadenopathy:     Cervical: No cervical adenopathy.  Skin:    General: Skin is warm.     Coloration: Skin is not jaundiced or pale.     Findings: No bruising, erythema, lesion or rash.  Neurological:     General: No focal deficit present.     Mental Status: She is alert and oriented to person, place, and time. Mental status is at baseline.     Cranial Nerves: No cranial nerve deficit.     Sensory: No sensory deficit.     Motor: No weakness.     Coordination: Coordination normal.     Gait: Gait normal.     Deep Tendon Reflexes: Reflexes normal.  Psychiatric:        Mood and Affect: Mood normal.        Behavior: Behavior normal.        Thought Content: Thought content normal.        Judgment: Judgment normal.           Assessment & Plan:  Change in stool habits - Plan: Fecal Globin By Immunochemistry, Fecal Globin By Immunochemistry, Fecal Globin By Immunochemistry, CBC with Differential/Platelet, COMPLETE METABOLIC PANEL WITH GFR  Exam is unremarkable.  I will check a CMP to evaluate for any elevation in bilirubin that [name] cause a change in stool color.  I will also check a CBC for any leukocytosis.  I will check her stool for blood.  However I suspect that this is likely some kind of diabetes and the food or drink that she has been consuming.  If lab work and stool test are normal, I feel I can reassure the patient that everything is okay

## 2020-08-10 DIAGNOSIS — R194 Change in bowel habit: Secondary | ICD-10-CM | POA: Diagnosis not present

## 2020-08-10 LAB — CBC WITH DIFFERENTIAL/PLATELET
Absolute Monocytes: 503 cells/uL (ref 200–950)
Basophils Absolute: 40 cells/uL (ref 0–200)
Basophils Relative: 0.6 %
Eosinophils Absolute: 168 cells/uL (ref 15–500)
Eosinophils Relative: 2.5 %
HCT: 37.5 % (ref 35.0–45.0)
Hemoglobin: 11.9 g/dL (ref 11.7–15.5)
Lymphs Abs: 2767 cells/uL (ref 850–3900)
MCH: 26.1 pg — ABNORMAL LOW (ref 27.0–33.0)
MCHC: 31.7 g/dL — ABNORMAL LOW (ref 32.0–36.0)
MCV: 82.2 fL (ref 80.0–100.0)
MPV: 10.3 fL (ref 7.5–12.5)
Monocytes Relative: 7.5 %
Neutro Abs: 3223 cells/uL (ref 1500–7800)
Neutrophils Relative %: 48.1 %
Platelets: 300 10*3/uL (ref 140–400)
RBC: 4.56 10*6/uL (ref 3.80–5.10)
RDW: 14.8 % (ref 11.0–15.0)
Total Lymphocyte: 41.3 %
WBC: 6.7 10*3/uL (ref 3.8–10.8)

## 2020-08-10 LAB — COMPLETE METABOLIC PANEL WITH GFR
AG Ratio: 1.4 (calc) (ref 1.0–2.5)
ALT: 16 U/L (ref 6–29)
AST: 18 U/L (ref 10–35)
Albumin: 4.1 g/dL (ref 3.6–5.1)
Alkaline phosphatase (APISO): 37 U/L (ref 37–153)
BUN/Creatinine Ratio: 20 (calc) (ref 6–22)
BUN: 19 mg/dL (ref 7–25)
CO2: 29 mmol/L (ref 20–32)
Calcium: 9.9 mg/dL (ref 8.6–10.4)
Chloride: 105 mmol/L (ref 98–110)
Creat: 0.94 mg/dL — ABNORMAL HIGH (ref 0.60–0.93)
GFR, Est African American: 67 mL/min/{1.73_m2} (ref >=60)
GFR, Est Non African American: 58 mL/min/{1.73_m2} — ABNORMAL LOW (ref >=60)
Globulin: 3 g/dL (calc) (ref 1.9–3.7)
Glucose, Bld: 113 mg/dL — ABNORMAL HIGH (ref 65–99)
Potassium: 4 mmol/L (ref 3.5–5.3)
Sodium: 142 mmol/L (ref 135–146)
Total Bilirubin: 0.4 mg/dL (ref 0.2–1.2)
Total Protein: 7.1 g/dL (ref 6.1–8.1)

## 2020-08-11 ENCOUNTER — Other Ambulatory Visit: Payer: Self-pay

## 2020-08-11 ENCOUNTER — Other Ambulatory Visit: Payer: Medicare HMO

## 2020-08-11 ENCOUNTER — Encounter: Payer: Self-pay | Admitting: *Deleted

## 2020-08-11 DIAGNOSIS — R194 Change in bowel habit: Secondary | ICD-10-CM | POA: Diagnosis not present

## 2020-08-12 LAB — FECAL GLOBIN BY IMMUNOCHEMISTRY
FECAL GLOBIN RESULT:: DETECTED — AB
FECAL GLOBIN RESULT:: NOT DETECTED
FECAL GLOBIN RESULT:: NOT DETECTED
MICRO NUMBER:: 11711086
MICRO NUMBER:: 11711085
MICRO NUMBER:: 11711084
SPECIMEN QUALITY:: ADEQUATE
SPECIMEN QUALITY:: ADEQUATE
SPECIMEN QUALITY:: ADEQUATE

## 2020-09-01 ENCOUNTER — Other Ambulatory Visit: Payer: Self-pay | Admitting: Family Medicine

## 2020-09-27 NOTE — Progress Notes (Signed)
Chronic Care Management Pharmacy Note  09/29/2020 Name:  [name] MRN:  [id number] DOB:  06-02-1940  Subjective: [name] is an 80 y.o. year old female who is a primary patient of Pickard, Cammie Mcgee, MD.  The CCM team was consulted for assistance with disease management and care coordination needs.    Engaged with patient by telephone for follow up visit in response to provider referral for pharmacy case management and/or care coordination services.   Consent to Services:  The patient was given the following information about Chronic Care Management services today, agreed to services, and gave verbal consent: 1. CCM service includes personalized support from designated clinical staff supervised by the primary care provider, including individualized plan of care and coordination with other care providers 2. 24/7 contact phone numbers for assistance for urgent and routine care needs. 3. Service will only be billed when office clinical staff spend 20 minutes or more in a month to coordinate care. 4. Only one practitioner [name] furnish and bill the service in a calendar month. 5.The patient [name] stop CCM services at any time (effective at the end of the month) by phone call to the office staff. 6. The patient will be responsible for cost sharing (co-pay) of up to 20% of the service fee (after annual deductible is met). Patient agreed to services and consent obtained.  Patient Care Team: Susy Frizzle, MD as PCP - General (Family Medicine) Jerline Pain, MD as PCP - Cardiology (Cardiology) Edythe Clarity, [facility] as Pharmacist (Pharmacist)  Recent office visits: 08/09/20 Dennard Schaumann) - changes in bowel habits, 1 of 3 stool samples shows blood.  Patient will defer GI referral at this time.  Recent consult visits: None recent  Hospital visits: None in previous 6 months  Objective:  Lab Results  Component Value Date   CREATININE 0.94 (H) 08/09/2020   BUN 19 08/09/2020    GFRNONAA 58 (L) 08/09/2020   GFRAA 67 08/09/2020   NA 142 08/09/2020   K 4.0 08/09/2020   CALCIUM 9.9 08/09/2020   CO2 29 08/09/2020   GLUCOSE 113 (H) 08/09/2020    Lab Results  Component Value Date/Time   HGBA1C 6.0 (H) 06/08/2020 09:33 AM   HGBA1C 6.1 (H) 06/19/2019 11:00 AM    Last diabetic Eye exam:  Lab Results  Component Value Date/Time   HMDIABEYEEXA No Retinopathy 01/17/2019 12:00 AM    Last diabetic Foot exam: No results found for: HMDIABFOOTEX   Lab Results  Component Value Date   CHOL 170 06/08/2020   HDL 60 06/08/2020   LDLCALC 95 06/08/2020   TRIG 67 06/08/2020   CHOLHDL 2.8 06/08/2020    Hepatic Function Latest Ref Rng & Units 08/09/2020 06/08/2020 12/01/2019  Total Protein 6.1 - 8.1 g/dL 7.1 7.4 7.2  Albumin 3.6 - 5.1 g/dL - - -  AST 10 - 35 U/L _0 ALT 6 - 29 U/L _1 Alk Phosphatase 33 - 130 U/L - - -  Total Bilirubin 0.2 - 1.2 mg/dL 0.4 0.5 0.4  Bilirubin, Direct 0.0 - 0.3 mg/dL - - -    Lab Results  Component Value Date/Time   TSH 5.20 (H) 06/08/2020 09:33 AM    CBC Latest Ref Rng & Units 08/09/2020 06/08/2020 12/01/2019  WBC 3.8 - 10.8 Thousand/uL 6.7 6.2 6.6  Hemoglobin 11.7 - 15.5 g/dL 11.9 12.3 12.3  Hematocrit 35.0 - 45.0 % 37.5 36.7 36.7  Platelets 140 - 400 Thousand/uL 300 299  293    No results found for: VD25OH  Clinical ASCVD: Yes  The 10-year ASCVD risk score Taylor Bussing DC Jr., et al., 2013) is: 37.7%   Values used to calculate the score:     Age: 67 years     Sex: Female     Is Non-Hispanic African American: Yes     Diabetic: Yes     Tobacco smoker: No     Systolic Blood Pressure: 767 mmHg     Is BP treated: Yes     HDL Cholesterol: 60 mg/dL     Total Cholesterol: 170 mg/dL    Depression screen [facility] 2/9 07/08/2019 12/06/2017 06/14/2017  Decreased Interest 0 0 0  Down, Depressed, Hopeless 0 0 0  PHQ - 2 Score 0 0 0  Altered sleeping - 0 -  Tired, decreased energy - 0 -  Change in appetite - 0 -  Feeling bad or failure  about yourself  - 0 -  Trouble concentrating - 0 -  Moving slowly or fidgety/restless - 0 -  Suicidal thoughts - 0 -  PHQ-9 Score - 0 -  Difficult doing work/chores - Not difficult at all -     Social History   Tobacco Use  Smoking Status Former Smoker  . Quit date: 09/07/1973  . Years since quitting: 47.0  Smokeless Tobacco Never Used   BP Readings from Last 3 Encounters:  08/09/20 136/70  06/08/20 (!) 150/62  12/01/19 (!) 150/60   Pulse Readings from Last 3 Encounters:  08/09/20 80  06/08/20 75  12/01/19 86   Wt Readings from Last 3 Encounters:  08/09/20 251 lb (113.9 kg)  06/08/20 251 lb (113.9 kg)  12/01/19 257 lb (116.6 kg)   BMI Readings from Last 3 Encounters:  08/09/20 43.08 kg/m  06/08/20 43.08 kg/m  12/01/19 44.11 kg/m    Assessment/Interventions: Review of patient past medical history, allergies, medications, health status, including review of consultants reports, laboratory and other test data, was performed as part of comprehensive evaluation and provision of chronic care management services.   SDOH:  (Social Determinants of Health) assessments and interventions performed: Yes   Financial Resource Strain: Low Risk   . Difficulty of Paying Living Expenses: Not very hard    SDOH Screenings   Alcohol Screen: Not on file  Depression (HAL9-3): Not on file  Financial Resource Strain: Low Risk   . Difficulty of Paying Living Expenses: Not very hard  Food Insecurity: Not on file  Housing: Not on file  Physical Activity: Not on file  Social Connections: Not on file  Stress: Not on file  Tobacco Use: Medium Risk  . Smoking Tobacco Use: Former Smoker  . Smokeless Tobacco Use: Never Used  Transportation Needs: Not on file    CCM Care Plan  Allergies  Allergen Reactions  . Vytorin [Ezetimibe-Simvastatin] Other (See Comments)    myalgia    Medications Reviewed Today    Reviewed by Six, Eden Lathe, LPN (Licensed Practical Nurse) on 08/09/20 at  1144  Med List Status: <None>  Medication Order Taking? Sig Documenting Provider Last Dose Status Informant  ACCU-CHEK AVIVA PLUS test strip [id number] Yes TEST BLOOD SUGAR EVERY DAY Susy Frizzle, MD Taking Active   Accu-Chek Softclix Lancets lancets [id number] Yes CHECK BLOOD SUGAR EVERY MORNING Susy Frizzle, MD Taking Active   amLODipine (NORVASC) 10 MG tablet [id number] Yes Take 1 tablet (10 mg total) by mouth daily. Susy Frizzle, MD Taking Active   Ascorbic Acid (VITAMIN  C) 1000 MG tablet [id number] Yes Take 1,000 mg by mouth daily. [provider] Taking Active   aspirin 81 MG chewable tablet [id number] Yes Chew 81 mg by mouth daily. [provider] Taking Active   atorvastatin (LIPITOR) 80 MG tablet [id number] Yes TAKE 1/2 TABLET EVERY DAY Susy Frizzle, MD Taking Active   Blood Glucose Monitoring Suppl (ACCU-CHEK AVIVA PLUS) w/Device KIT [id number] Yes USE AS DIRECTED TO CHECK BLOOD SUGAR Susy Frizzle, MD Taking Active   Cholecalciferol (VITAMIN D) 2000 UNITS tablet [id number] Yes Take 1,000 Units by mouth daily.  [provider] Taking Active   Coenzyme Q10 (CO Q-10) 100 MG CAPS [id number] Yes Take by mouth. [provider] Taking Active   doxazosin (CARDURA) 1 MG tablet [id number] Yes TAKE 1 TABLET (1 MG TOTAL) BY MOUTH DAILY. Susy Frizzle, MD Taking Active   fenofibrate 54 MG tablet [id number] Yes TAKE 1 TABLET EVERY DAY Susy Frizzle, MD Taking Active   Lancet Devices (ACCU-CHEK [place]) lancets [id number] Yes Check BS qd - DX- E11.9 Susy Frizzle, MD Taking Active   losartan-hydrochlorothiazide [facility]) 100-25 MG tablet [id number] Yes TAKE 1 TABLET EVERY DAY Susy Frizzle, MD Taking Active   metoprolol succinate (TOPROL-XL) 50 MG 24 hr tablet [id number] Yes TAKE 1 TABLET EVERY DAY WITH OR IMMEDIATELY FOLLOWING A MEAL. Susy Frizzle, MD Taking Active   naproxen sodium (ALEVE) 220 MG tablet [id number] Yes Take 220 mg by mouth  daily as needed (knee pain). [provider] Taking Active   Omega-3 Fatty Acids (FISH OIL CONCENTRATE PO) [id number] Yes Take by mouth. [provider] Taking Active   Polyethyl Glycol-Propyl Glycol 0.4-0.3 % SOLN [id number] Yes Apply to eye. [provider] Taking Active   polyethylene glycol [name] / GLYCOLAX) packet [id number] Yes Take 17 g by mouth daily. [provider] Taking Active   pyridOXINE (VITAMIN B-6) 100 MG tablet [id number] Yes Take 100 mg by mouth daily as needed. [provider] Taking Active           Patient Active Problem List   Diagnosis Date Noted  . Atherosclerosis of aorta ([place]) 05/04/2015  . Chest pain 01/26/2011  . HTN (hypertension)   . HLD (hyperlipidemia)   . Morbid obesity ([place])   . Gallbladder sludge   . Brain tumor ([place])   . PMR (polymyalgia rheumatica) ([facility])   . Retinopathy   . Colon polyps   . Rheumatoid arthritis ([place])   . Helicobacter pylori (H. pylori) 12/21/2010    Immunization History  Administered Date(s) Administered  . Fluad Quad(high Dose 65+) 01/31/2019, 01/28/2020  . Influenza Whole 01/17/2010, 02/07/2012  . Influenza, High Dose Seasonal PF 02/13/2017  . Influenza,inj,Quad PF,6+ Mos 02/04/2013, 02/11/2014, 02/16/2015, 01/18/2016, 01/25/2018  . PFIZER(Purple Top)SARS-COV-2 Vaccination 07/12/2019, 08/09/2019, 03/05/2020  . Pneumococcal Conjugate-13 05/28/2014  . Pneumococcal Polysaccharide-23 04/25/2005, 02/11/2013  . Td 10/14/1998  . Tdap 12/06/2017    Conditions to be addressed/monitored:  Hypertension, Hyperlipidemia and RA  Care Plan : General Pharmacy (Adult)  Updates made by Edythe Clarity, RPH since 09/29/2020 12:00 AM    Problem: HTN, HLD, RA   Priority: High  Onset Date: 09/28/2020    Long-Range Goal: Patient-Specific Goal   Start Date: 09/28/2020  Expected End Date: 04/01/2021  This Visit's Progress: On track  Priority: High  Note:   Current Barriers:  . No specific  barriers currently  Pharmacist Clinical Goal(s):  Marland Kitchen Patient will maintain control of blood pressure as evidenced by  home monitoring  . adhere to plan to optimize therapeutic regimen for reflux pain as evidenced by report of adherence to recommended medication management changes . contact provider office for questions/concerns as evidenced notation of same in electronic health record through collaboration with PharmD and provider.   Interventions: . 1:1 collaboration with Susy Frizzle, MD regarding development and update of comprehensive plan of care as evidenced by provider attestation and co-signature . Inter-disciplinary care team collaboration (see longitudinal plan of care) . Comprehensive medication review performed; medication list updated in electronic medical record  Hypertension (BP goal <140/90) -Controlled -Current treatment:  Amlodipine 22m  Losartan-HCTZ 100-245m Metoprolol Succinate 5055maily  Doxazosin 1mg63mily -Medications previously tried: none noted  -Current home readings: 135/70, 140/75 -Denies hypotensive/hypertensive symptoms -Educated on BP goals and benefits of medications for prevention of heart attack, stroke and kidney damage; Daily salt intake goal < 2300 mg; Importance of home blood pressure monitoring; -Counseled to monitor BP at home a few times per week, document, and provide log at future appointments -Patient BP seems to being doing much better overall -Denies any symptoms of hypotension -Recommended to continue current medication  Hyperlipidemia: (LDL goal < 100) -Controlled -Current treatment:  Atorvastatin 80mg22mne-half tablet po daily  Fenofibrate 54mg 57mtablet daily -Medications previously tried: none noted   -Educated on Cholesterol goals;  Benefits of statin for ASCVD risk reduction; Importance of limiting foods high in cholesterol;  -Reviewed most recent lipid panel, all levels controlled -Patient reports adherence  with medication no adverse effects -Recommended to continue current medication   Reflux? (Goal: Control symptoms) -Not ideally controlled -Current treatment: None -Medications previously tried: none noted -Patient reports she wakes up with a pain in her side and when she burps and gets moving it goes away. -She denies any chest pain, etc. -Currently not taking anything for reflux.  -Recommended patient to try Pepcid OTC at bedtime to see if this helps.  If after a few days problem still persists recommended she make appointment with Dr. PickarDennard Schaumanntient Goals/Self-Care Activities . Patient will:  - take medications as prescribed focus on medication adherence by pill box check blood pressure a few times per week, document, and provide at future appointments  Follow Up Plan: The care management team will reach out to the patient again over the next 120 days.       Medication Assistance: None required.  Patient affirms current coverage meets needs.  Patient's preferred pharmacy is:  [id number] [place] [address] [place] [phone] [facility] [address] [phone] box? Yes Pt endorses 100% compliance  We discussed: Benefits of medication synchronization, packaging and delivery as well as enhanced pharmacist oversight with [employer]. Patient decided to: Continue current medication management strategy  Care Plan and Follow Up Patient Decision:  Patient agrees to Care Plan and Follow-up.  Plan: The care management team will reach out to the patient again over the next 120 days.  ChristiBeverly MilchD Clinical Pharmacist [phone]

## 2020-09-28 ENCOUNTER — Ambulatory Visit (INDEPENDENT_AMBULATORY_CARE_PROVIDER_SITE_OTHER): Payer: Medicare HMO | Admitting: Pharmacist

## 2020-09-28 DIAGNOSIS — E7800 Pure hypercholesterolemia, unspecified: Secondary | ICD-10-CM | POA: Diagnosis not present

## 2020-09-28 DIAGNOSIS — I159 Secondary hypertension, unspecified: Secondary | ICD-10-CM

## 2020-09-29 NOTE — Patient Instructions (Addendum)
Visit Information  Goals Addressed            This Visit's Progress   . Track and Manage My Blood Pressure-Hypertension (GOAL < 140/90)       Timeframe:  Long-Range Goal Priority:  High Start Date:    09/28/20                         Expected End Date: 03/31/21                      Follow Up Date 12/12/20   - check blood pressure 3 times per week - choose a place to take my blood pressure (home, clinic or office, retail store) - write blood pressure results in a log or diary    Why is this important?    You won't feel high blood pressure, but it can still hurt your blood vessels.   High blood pressure can cause heart or kidney problems. It can also cause a stroke.   Making lifestyle changes like losing a little weight or eating less salt will help.   Checking your blood pressure at home and at different times of the day can help to control blood pressure.   If the doctor prescribes medicine remember to take it the way the doctor ordered.   Call the office if you cannot afford the medicine or if there are questions about it.     Notes:       Patient Care Plan: General Pharmacy (Adult)    Problem Identified: HTN, HLD, RA   Priority: High  Onset Date: 09/28/2020    Long-Range Goal: Patient-Specific Goal   Start Date: 09/28/2020  Expected End Date: 04/01/2021  This Visit's Progress: On track  Priority: High  Note:   Current Barriers:  . No specific barriers currently  Pharmacist Clinical Goal(s):  Marland Kitchen Patient will maintain control of blood pressure as evidenced by home monitoring  . adhere to plan to optimize therapeutic regimen for reflux pain as evidenced by report of adherence to recommended medication management changes . contact provider office for questions/concerns as evidenced notation of same in electronic health record through collaboration with PharmD and provider.   Interventions: . 1:1 collaboration with Susy Frizzle, MD regarding development and  update of comprehensive plan of care as evidenced by provider attestation and co-signature . Inter-disciplinary care team collaboration (see longitudinal plan of care) . Comprehensive medication review performed; medication list updated in electronic medical record  Hypertension (BP goal <140/90) -Controlled -Current treatment:  Amlodipine 10mg   Losartan-HCTZ 100-25mg   Metoprolol Succinate 50mg  daily  Doxazosin 1mg  daily -Medications previously tried: none noted  -Current home readings: 135/70, 140/75 -Denies hypotensive/hypertensive symptoms -Educated on BP goals and benefits of medications for prevention of heart attack, stroke and kidney damage; Daily salt intake goal < 2300 mg; Importance of home blood pressure monitoring; -Counseled to monitor BP at home a few times per week, document, and provide log at future appointments -Patient BP seems to being doing much better overall -Denies any symptoms of hypotension -Recommended to continue current medication  Hyperlipidemia: (LDL goal < 100) -Controlled -Current treatment:  Atorvastatin 80mg  - one-half tablet po daily  Fenofibrate 54mg  one tablet daily -Medications previously tried: none noted   -Educated on Cholesterol goals;  Benefits of statin for ASCVD risk reduction; Importance of limiting foods high in cholesterol;  -Reviewed most recent lipid panel, all levels controlled -Patient reports adherence with medication  no adverse effects -Recommended to continue current medication   Reflux? (Goal: Control symptoms) -Not ideally controlled -Current treatment: None -Medications previously tried: none noted -Patient reports she wakes up with a pain in her side and when she burps and gets moving it goes away. -She denies any chest pain, etc. -Currently not taking anything for reflux.  -Recommended patient to try Pepcid OTC at bedtime to see if this helps.  If after a few days problem still persists recommended she make  appointment with Dr. Dennard Schaumann.   Patient Goals/Self-Care Activities . Patient will:  - take medications as prescribed focus on medication adherence by pill box check blood pressure a few times per week, document, and provide at future appointments  Follow Up Plan: The care management team will reach out to the patient again over the next 120 days.       The patient verbalized understanding of instructions, educational materials, and care plan provided today and agreed to receive a mailed copy of patient instructions, educational materials, and care plan.  Telephone follow up appointment with pharmacy team member scheduled for: 4 months  Edythe Clarity, [facility]

## 2020-10-04 ENCOUNTER — Ambulatory Visit (HOSPITAL_COMMUNITY)
Admission: RE | Admit: 2020-10-04 | Discharge: 2020-10-04 | Disposition: A | Discharge status: Home / Self Care | Payer: Medicare HMO | Source: Ambulatory Visit | Attending: Family Medicine | Admitting: Family Medicine

## 2020-10-04 ENCOUNTER — Other Ambulatory Visit: Payer: Self-pay

## 2020-10-04 ENCOUNTER — Encounter: Payer: Self-pay | Admitting: Family Medicine

## 2020-10-04 ENCOUNTER — Ambulatory Visit (INDEPENDENT_AMBULATORY_CARE_PROVIDER_SITE_OTHER): Payer: Medicare HMO | Admitting: Family Medicine

## 2020-10-04 VITALS — BP 138/84 | HR 80 | Temp 97.6°F | Resp 16 | Ht 64.0 in | Wt 247.0 lb

## 2020-10-04 DIAGNOSIS — R109 Unspecified abdominal pain: Secondary | ICD-10-CM | POA: Diagnosis not present

## 2020-10-04 DIAGNOSIS — M47814 Spondylosis without myelopathy or radiculopathy, thoracic region: Secondary | ICD-10-CM | POA: Diagnosis not present

## 2020-10-04 DIAGNOSIS — M4184 Other forms of scoliosis, thoracic region: Secondary | ICD-10-CM | POA: Diagnosis not present

## 2020-10-04 DIAGNOSIS — M545 Low back pain, unspecified: Secondary | ICD-10-CM | POA: Diagnosis not present

## 2020-10-04 NOTE — Progress Notes (Signed)
Subjective:    Patient ID: [name], female    DOB: 05-20-1940, 80 y.o.   MRN: [id number] Patient states that for the last 3 to 4 weeks, she has had a discomfort in her right flank.  She states that she feels that usually the most at night when she is laying in bed.  If she goes to turn over, she will have a sharp pain began in her right flank and occasionally radiate around into her right lower abdomen.  Movement triggers the pain.  Right now she is fine.  She denies any relationship of the pain to food.  She denies any melena or hematochezia.  There is no tenderness to palpation of the right upper quadrant.  She has no CVA tenderness.  She denies any dysuria.  She denies any hematuria.  I am unable to reproduce the pain with palpation of the thoracic and lumbar paraspinal muscles.  There is no visible rash in the area where she is affected.  She states that usually she feels fine, however the discomfort becomes most pronounced when she rolls over in bed.  Then she can feel it.. Past Medical History:  Diagnosis Date  . Arthritis    rheumatoid arthritis  . Brain tumor ([place])   . Chest pain   . Colon polyps   . Dyspepsia   . Gallbladder sludge   . Hypercholesterolemia   . Hypertension   . Left atrial dilatation    Mild  . Mild aortic sclerosis    without stenosis  . Mitral annular calcification    Mild  . Mitral valve disorder    Mildly thickened mitral valve, but with normal leaflet but with normal leaflet   . 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 lt eye implant  1998  . rt knee surg  1991  . TUBAL LIGATION  1981   Current Outpatient Medications on File Prior to Visit  Medication Sig Dispense Refill  . ACCU-CHEK AVIVA PLUS test strip TEST BLOOD SUGAR EVERY DAY 100 strip 3  . Accu-Chek Softclix Lancets lancets CHECK BLOOD SUGAR EVERY MORNING 100 each 3  . amLODipine (NORVASC) 10  MG tablet Take 1 tablet (10 mg total) by mouth daily. 90 tablet 3  . Ascorbic Acid (VITAMIN C) 1000 MG tablet Take 1,000 mg by mouth daily.    Marland Kitchen aspirin 81 MG chewable tablet Chew 81 mg by mouth daily.    Marland Kitchen atorvastatin (LIPITOR) 80 MG tablet TAKE 1/2 TABLET EVERY DAY 45 tablet 3  . Blood Glucose Monitoring Suppl (ACCU-CHEK AVIVA PLUS) w/Device KIT USE AS DIRECTED TO CHECK BLOOD SUGAR 1 kit 0  . Cholecalciferol (VITAMIN D) 2000 UNITS tablet Take 1,000 Units by mouth daily.     . Coenzyme Q10 (CO Q-10) 100 MG CAPS Take by mouth.    . doxazosin (CARDURA) 1 MG tablet TAKE 1 TABLET (1 MG TOTAL) BY MOUTH DAILY. 90 tablet 3  . famotidine (PEPCID) 20 MG tablet Take 20 mg by mouth 2 (two) times daily.    . fenofibrate 54 MG tablet TAKE 1 TABLET EVERY DAY 90 tablet 3  . Lancet Devices (ACCU-CHEK SOFTCLIX) lancets Check BS qd - DX- E11.9 1 each 5  . losartan-hydrochlorothiazide (HYZAAR) 100-25 MG tablet TAKE 1 TABLET EVERY DAY 90 tablet 1  . metoprolol succinate (TOPROL-XL) 50 MG 24 hr tablet TAKE 1 TABLET  EVERY DAY WITH OR IMMEDIATELY FOLLOWING A MEAL. 90 tablet 3  . naproxen sodium (ALEVE) 220 MG tablet Take 220 mg by mouth daily as needed (knee pain).    . Omega-3 Fatty Acids (FISH OIL CONCENTRATE PO) Take by mouth.    Vladimir Faster Glycol-Propyl Glycol 0.4-0.3 % SOLN Apply to eye.    . polyethylene glycol (MIRALAX / GLYCOLAX) packet Take 17 g by mouth daily.    Marland Kitchen pyridOXINE (VITAMIN B-6) 100 MG tablet Take 100 mg by mouth daily as needed.     No current facility-administered medications on file prior to visit.   Allergies  Allergen Reactions  . Vytorin [Ezetimibe-Simvastatin] Other (See Comments)    myalgia   Social History   Socioeconomic History  . Marital status: Widowed    Spouse name: Not on file  . Number of children: Not on file  . Years of education: Not on file  . Highest education level: Not on file  Occupational History  . Not on file  Tobacco Use  . Smoking status: Former  Smoker    Quit date: 09/07/1973    Years since quitting: 47.1  . Smokeless tobacco: Never Used  Vaping Use  . Vaping Use: Never used  Substance and Sexual Activity  . Alcohol use: No  . Drug use: No  . Sexual activity: Not Currently  Other Topics Concern  . Not on file  Social History Narrative  . Not on file   Social Determinants of Health   Financial Resource Strain: Low Risk   . Difficulty of Paying Living Expenses: Not very hard  Food Insecurity: Not on file  Transportation Needs: Not on file  Physical Activity: Not on file  Stress: Not on file  Social Connections: Not on file  Intimate Partner Violence: Not on file    Family History  Problem Relation Age of Onset  . Stroke Paternal Grandmother   . Diabetes Mother   . Diabetes Sister   . Diabetes Grandchild      Review of Systems  All other systems reviewed and are negative.      Objective:   Physical Exam Vitals reviewed.  Constitutional:      General: She is not in acute distress.    Appearance: Normal appearance. She is well-developed. She is obese. She is not ill-appearing, toxic-appearing or diaphoretic.  HENT:     Head: Normocephalic and atraumatic.  Neck:     Thyroid: No thyromegaly.     Vascular: No JVD.  Cardiovascular:     Rate and Rhythm: Normal rate and regular rhythm.     Pulses: Normal pulses.     Heart sounds: Murmur heard.  No friction rub. No gallop.   Pulmonary:     Effort: Pulmonary effort is normal. No respiratory distress.     Breath sounds: Normal breath sounds. No stridor. No wheezing, rhonchi or rales.  Chest:     Chest wall: No tenderness.  Abdominal:     General: Bowel sounds are decreased. There is no distension.     Palpations: Abdomen is soft. There is no mass.     Tenderness: There is no abdominal tenderness. There is no guarding or rebound.     Hernia: No hernia is present.    Musculoskeletal:     Cervical back: No tenderness.       Back:     Right lower leg:  No edema.     Left lower leg: No edema.  Skin:    General:  Skin is warm.     Coloration: Skin is not jaundiced or pale.     Findings: No bruising, erythema, lesion or rash.  Neurological:     Mental Status: She is alert.           Assessment & Plan:  Flank pain - Plan: DG Lumbar Spine Complete, DG Thoracic Spine W/Swimmers  I believe this is most likely musculoskeletal.  Differential diagnosis also includes biliary colic and kidney stones however the pain seems to be brought on usually by motion lying in bed whenever she rolls over or sits to get up she will feel sharp pain in her back.  It will radiate as diagrammed above.  She denies any pain with urination.  She denies any hematuria.  She denies any dysuria.  She denies any melena or hematochezia or nausea or vomiting or fever or chills.  Pain seems very mild and far removed today.  Today her belly is soft nondistended and nontender with no guarding and no rebound

## 2020-10-08 ENCOUNTER — Other Ambulatory Visit: Payer: Self-pay | Admitting: *Deleted

## 2020-10-08 DIAGNOSIS — R109 Unspecified abdominal pain: Secondary | ICD-10-CM

## 2020-10-08 DIAGNOSIS — M5134 Other intervertebral disc degeneration, thoracic region: Secondary | ICD-10-CM

## 2020-10-08 DIAGNOSIS — M5136 Other intervertebral disc degeneration, lumbar region: Secondary | ICD-10-CM

## 2020-10-18 ENCOUNTER — Other Ambulatory Visit: Payer: Self-pay

## 2020-10-18 ENCOUNTER — Ambulatory Visit (HOSPITAL_COMMUNITY): Payer: Medicare HMO | Attending: Family Medicine

## 2020-10-18 ENCOUNTER — Encounter (HOSPITAL_COMMUNITY): Payer: Self-pay

## 2020-10-18 DIAGNOSIS — R262 Difficulty in walking, not elsewhere classified: Secondary | ICD-10-CM | POA: Diagnosis not present

## 2020-10-18 DIAGNOSIS — M545 Low back pain, unspecified: Secondary | ICD-10-CM | POA: Insufficient documentation

## 2020-10-18 DIAGNOSIS — M6281 Muscle weakness (generalized): Secondary | ICD-10-CM | POA: Diagnosis not present

## 2020-10-18 NOTE — Patient Instructions (Signed)
Access Code: [email or web address] URL: [email or web address] Date: 10/18/2020 Prepared by: Sherlyn Lees  Exercises Supine Lower Trunk Rotation - 1 x daily - 7 x weekly - 3 sets - 10 reps Supine Posterior Pelvic Tilt - 1 x daily - 7 x weekly - 3 sets - 10 reps - 2 sec hold Supine Heel Slide with Strap - 1 x daily - 7 x weekly - 3 sets - 10 reps - 5-10 sec hold

## 2020-10-18 NOTE — Therapy (Signed)
[place], [place], [address] Phone: [phone]   Fax:  [phone]  Physical Therapy Evaluation  Patient Details  Name: [name] MRN: [id number] Date of Birth: August 04, 1940 Referring Provider (PT): Susy Frizzle, MD   Encounter Date: 10/18/2020   PT End of Session - 10/18/20 0957    Visit Number 1    Number of Visits 6    Date for PT Re-Evaluation 11/29/20    Authorization Type [employer], auth required    PT Start Time 0955    PT Stop Time 1030    PT Time Calculation (min) 35 min    Activity Tolerance Patient tolerated treatment well    Behavior During Therapy [facility] for tasks assessed/performed           Past Medical History:  Diagnosis Date  . Arthritis    rheumatoid arthritis  . Brain tumor ([place])   . Chest pain   . Colon polyps   . Dyspepsia   . Gallbladder sludge   . Hypercholesterolemia   . Hypertension   . Left atrial dilatation    Mild  . Mild aortic sclerosis    without stenosis  . Mitral annular calcification    Mild  . Mitral valve disorder    Mildly thickened mitral valve, but with normal leaflet but with normal leaflet   . 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 lt eye implant  1998  . rt knee surg  1991  . TUBAL LIGATION  1981    There were no vitals filed for this visit.    Subjective Assessment - 10/18/20 0958    Subjective Patient notes mid and low back pain off and on for about a month and notes increased right side pain especially when turning over in bed. Pt denies pain when walking but notes a catch in her back when going to sit down right> left    Currently in Pain? Yes    Pain Score 4     Pain Location Back    Pain Orientation Right    Pain Descriptors / Indicators Aching    Pain Type Acute pain;Chronic pain    Pain Onset 1 to 4 weeks ago    Pain  Frequency Several days a week    Aggravating Factors  rolling in bed    Pain Relieving Factors movement, exercise              [facility] PT Assessment - 10/18/20 0001      Assessment   Medical Diagnosis back pain    Referring Provider (PT) Susy Frizzle, MD      Balance Screen   Has the patient fallen in the past 6 months No    Has the patient had a decrease in activity level because of a fear of falling?  No    Is the patient reluctant to leave their home because of a fear of falling?  No      Home Environment   Living Environment Private residence    Living Arrangements Alone    Type of [place] to enter    Entrance Stairs-Number of Steps 2    [place] One level    [place] - single point      Prior Function  Level of Independence Independent    Vocation Retired      Observation/Other Assessments   Focus on Therapeutic Outcomes (FOTO)  76% function      ROM / Strength   AROM / PROM / Strength AROM;Strength      AROM   AROM Assessment Site Lumbar;Knee    Right/Left Knee Right    Right Knee Extension 0    Right Knee Flexion 95    Lumbar Flexion WNL    Lumbar Extension WNL    Lumbar - Right Side Bend WNL, painful    Lumbar - Left Side Bend WNL      Strength   Strength Assessment Site Lumbar    Lumbar Flexion 2+/5    Lumbar Extension 2+/5      Ambulation/Gait   Ambulation/Gait Yes    Ambulation/Gait Assistance 6: Modified independent (Device/Increase time)    Ambulation Distance (Feet) 225 Feet    Assistive device Straight cane    Gait Pattern Step-through pattern;Decreased stance time - right;Decreased hip/knee flexion - right    Ambulation Surface Level;Indoor    Gait Comments 2MWT                      Objective measurements completed on examination: See above findings.               PT Education - 10/18/20 1131    Education Details education on assessment findings and tx rationale     Person(s) Educated Patient    Methods Explanation;Demonstration;Handout    Comprehension Verbalized understanding            PT Short Term Goals - 10/18/20 1134      PT SHORT TERM GOAL #1   Title Patient will be independent with HEP in order to improve functional outcomes.    Time 3    Period Weeks    Status New    Target Date 11/08/20      PT SHORT TERM GOAL #2   Title Patient will report at least 25% improvement in symptoms for improved quality of life.    Time 3    Period Weeks    Status New    Target Date 11/08/20      PT SHORT TERM GOAL #3   Title Patient will be able to ambulate at least 300 feet in 2MWT in order to demonstrate improved gait speed for community ambulation.    Baseline 225 ft w/ cane    Time 3    Period Weeks    Status New    Target Date 11/08/20             PT Long Term Goals - 10/18/20 1135      PT LONG TERM GOAL #1   Title Patient will report at least 50% improvement in symptoms for improved quality of life.    Time 6    Period Weeks    Status New    Target Date 11/29/20      PT LONG TERM GOAL #2   Title Demo core strength to 3+/5 to improve stability and decrease pain with rolling    Baseline 2+/5 flexion/extension strength, 4/10 pain with rolling in bed    Time 6    Period Weeks    Status New    Target Date 11/29/20      PT LONG TERM GOAL #3   Title Patient will improve FOTO score by at least 5 points in order to indicate improved  tolerance to activity.    Baseline 76% function    Time 6    Period Weeks    Status New    Target Date 11/29/20                  Plan - 10/18/20 1131    Clinical Impression Statement Patient is a  80 yo lady presenting to physical therapy with c/o LBP. She presents with pain limited deficits in trunk strength, endurance, postural impairments, spinal mobility and functional mobility with ADL. She is having to modify and restrict ADL as indicated by FOTO score as well as subjective information  and objective measures which is affecting overall participation. Patient will benefit from skilled physical therapy in order to improve function and reduce impairment.    Personal Factors and Comorbidities Age 80    Examination-Activity Limitations Lift;Stairs;Squat;Bed Mobility;Locomotion Level    Examination-Participation Restrictions Community Activity;Yard Work    Stability/Clinical Decision Making Stable/Uncomplicated    Designer, jewellery Low    Rehab Potential Good    PT Frequency 1x / week    PT Duration 6 weeks    PT Treatment/Interventions ADLs/Self Care Home Management;Aquatic Therapy;Electrical Stimulation;DME Instruction;Ultrasound;Traction;Moist Heat;Gait training;Stair training;Functional mobility training;Therapeutic activities;Therapeutic exercise;Balance training;Patient/family education;Neuromuscular re-education;Manual techniques;Passive range of motion;Taping;Dry needling;Spinal Manipulations;Joint Manipulations    PT Next Visit Plan core stabilization    PT Home Exercise Plan LTR, PPT, heel slides for right knee    Consulted and Agree with Plan of Care Patient           Patient will benefit from skilled therapeutic intervention in order to improve the following deficits and impairments:  Abnormal gait,Decreased activity tolerance,Decreased endurance,Decreased range of motion,Decreased strength,Difficulty walking,Postural dysfunction,Improper body mechanics,Obesity,Pain  Visit Diagnosis: Right-sided low back pain without sciatica, unspecified chronicity  Difficulty in walking, not elsewhere classified  Muscle weakness (generalized)     Problem List Patient Active Problem List   Diagnosis Date Noted  . Atherosclerosis of aorta ([place]) 05/04/2015  . Chest pain 01/26/2011  . HTN (hypertension)   . HLD (hyperlipidemia)   . Morbid obesity ([place])   . Gallbladder sludge   . Brain tumor ([place])   . PMR (polymyalgia rheumatica) ([facility])   . Retinopathy   . Colon  polyps   . Rheumatoid arthritis ([place])   . Helicobacter pylori (H. pylori) 12/21/2010   11:40 AM, 10/18/20 M. Sherlyn Lees, PT, DPT Physical Therapist- [place] Office Number: [phone]  [place] [address] [place], [place], [address] Phone: [phone]   Fax:  [phone]  Name: NAIOMY WATTERS MRN: [id number] Date of Birth: 02-09-1941

## 2020-10-26 ENCOUNTER — Ambulatory Visit (HOSPITAL_COMMUNITY): Payer: Medicare HMO | Admitting: Physical Therapy

## 2020-10-26 ENCOUNTER — Other Ambulatory Visit: Payer: Self-pay

## 2020-10-26 DIAGNOSIS — M6281 Muscle weakness (generalized): Secondary | ICD-10-CM

## 2020-10-26 DIAGNOSIS — M545 Low back pain, unspecified: Secondary | ICD-10-CM

## 2020-10-26 DIAGNOSIS — R262 Difficulty in walking, not elsewhere classified: Secondary | ICD-10-CM | POA: Diagnosis not present

## 2020-10-26 NOTE — Therapy (Signed)
[place], [place], [address] Phone: [phone]   Fax:  [phone]  Physical Therapy Treatment  Patient Details  Name: [name] MRN: [id number] Date of Birth: 12/13/1940 Referring Provider (PT): Susy Frizzle, MD   Encounter Date: 10/26/2020   PT End of Session - 10/26/20 0838     Visit Number 2    Number of Visits 6    Date for PT Re-Evaluation 11/29/20    Authorization Type [employer], auth required    PT Start Time 0834    PT Stop Time 0913    PT Time Calculation (min) 39 min    Activity Tolerance Patient tolerated treatment well    Behavior During Therapy 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   Subjective Assessment - 10/26/20 0837     Subjective States that she has been doing her exercises but her knee is still swollen. States that he back feels alright. States the laying down exercises are challenging to do in her bed    Currently in Pain? Yes    Pain Location Knee    Pain Orientation Right    Pain Descriptors / Indicators Tightness;Sore    Pain Type Chronic pain    Pain Onset 1 to 4 weeks ago                [facility] PT Assessment - 10/26/20 0001       Assessment   Medical Diagnosis  back pain    Referring Provider (PT) Susy Frizzle, MD                           [facility] Adult PT Treatment/Exercise - 10/26/20 0001       Exercises   Exercises Knee/Hip      Knee/Hip Exercises: Aerobic   Stationary Bike 6 minutes - rocking back and forth with verbal cues      Knee/Hip Exercises: Seated   Long Arc Quad Right;15 reps   5" holds   Other Seated Knee/Hip Exercises trunk rotations 15 5" holds bilateral, trunk extension and flexoin x10 5" holds    Other Seated Knee/Hip Exercises knee flexion AAROM x15 15" holds R    Sit to Sand 15 reps;without UE support                    PT Education - 10/26/20 0909     Education Details on current presentation, on HEP  Person(s) Educated Patient    Methods Explanation    Comprehension Verbalized understanding              PT Short Term Goals - 10/18/20 1134       PT SHORT TERM GOAL #1   Title Patient will be independent with HEP in order to improve functional outcomes.    Time 3    Period Weeks    Status New    Target Date 11/08/20      PT SHORT TERM GOAL #2   Title Patient will report at least 25% improvement in symptoms for improved quality of life.    Time 3    Period Weeks    Status New    Target Date 11/08/20      PT SHORT TERM GOAL #3   Title Patient will be able to ambulate at least 300 feet in 2MWT in order to demonstrate improved gait speed for community ambulation.    Baseline 225 ft w/ cane    Time 3    Period Weeks    Status New    Target Date 11/08/20               PT Long Term Goals - 10/18/20 1135       PT LONG TERM GOAL #1   Title Patient will report at least 50% improvement in symptoms for improved quality of life.    Time 6    Period Weeks    Status New    Target Date 11/29/20      PT LONG TERM GOAL #2   Title Demo core strength to 3+/5 to improve stability and decrease pain with rolling    Baseline 2+/5 flexion/extension strength, 4/10 pain  with rolling in bed    Time 6    Period Weeks    Status New    Target Date 11/29/20      PT LONG TERM GOAL #3   Title Patient will improve FOTO score by at least 5 points in order to indicate improved tolerance to activity.    Baseline 76% function    Time 6    Period Weeks    Status New    Target Date 11/29/20                   Plan - 10/26/20 0838     Clinical Impression Statement Transitioned to seated and standing exercises secondary to complaints of difficulty performing exercises in bed at home. Tolerated seated exercises well. Patient reported she enjoyed using the bike but hers is messed up at home and wanted to use it. Reduced soreness noted end of session.    Personal Factors and Comorbidities Age    Examination-Activity Limitations Lift;Stairs;Squat;Bed Mobility;Locomotion Level    Examination-Participation Restrictions Community Activity;Yard Work    Stability/Clinical Decision Making Stable/Uncomplicated    Rehab Potential Good    PT Frequency 1x / week    PT Duration 6 weeks    PT Treatment/Interventions ADLs/Self Care Home Management;Aquatic Therapy;Electrical Stimulation;DME Instruction;Ultrasound;Traction;Moist Heat;Gait training;Stair training;Functional mobility training;Therapeutic activities;Therapeutic exercise;Balance training;Patient/family education;Neuromuscular re-education;Manual techniques;Passive range of motion;Taping;Dry needling;Spinal Manipulations;Joint Manipulations    PT Next Visit Plan core stabilization    PT Home Exercise Plan LTR, PPT, heel slides for right knee; 6/14 STS, AAROM knee flexion, knee extension    Consulted and Agree with Plan of Care Patient             Patient will benefit from skilled therapeutic intervention in order  to improve the following deficits and impairments:  Abnormal gait, Decreased activity tolerance, Decreased endurance, Decreased range of motion, Decreased strength, Difficulty walking, Postural  dysfunction, Improper body mechanics, Obesity, Pain  Visit Diagnosis: Right-sided low back pain without sciatica, unspecified chronicity  Difficulty in walking, not elsewhere classified  Muscle weakness (generalized)     Problem List Patient Active Problem List   Diagnosis Date Noted   Atherosclerosis of aorta ([place]) 05/04/2015   Chest pain 01/26/2011   HTN (hypertension)    HLD (hyperlipidemia)    Morbid obesity ([place])    Gallbladder sludge    Brain tumor ([place])    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 pylori) 12/21/2010  9:15 AM, 10/26/20 Jerene Pitch, DPT Physical Therapy with [facility]  [phone] office   [place], [place], [address] Phone: [phone]   Fax:  [phone]  Name: [name] MRN: [id number] Date of Birth: 20-Jan-1941

## 2020-11-01 ENCOUNTER — Encounter (HOSPITAL_COMMUNITY): Payer: Self-pay

## 2020-11-01 ENCOUNTER — Ambulatory Visit (HOSPITAL_COMMUNITY): Payer: Medicare HMO

## 2020-11-01 ENCOUNTER — Other Ambulatory Visit: Payer: Self-pay

## 2020-11-01 DIAGNOSIS — M6281 Muscle weakness (generalized): Secondary | ICD-10-CM

## 2020-11-01 DIAGNOSIS — M545 Low back pain, unspecified: Secondary | ICD-10-CM

## 2020-11-01 DIAGNOSIS — R262 Difficulty in walking, not elsewhere classified: Secondary | ICD-10-CM

## 2020-11-01 NOTE — Therapy (Signed)
[place], [place], [address] Phone: [phone]   Fax:  [phone]  Physical Therapy Treatment  Patient Details  Name: [name] MRN: [id number] Date of Birth: August 07, 1940 Referring Provider (PT): Susy Frizzle, MD   Encounter Date: 11/01/2020   PT End of Session - 11/01/20 0803     Visit Number 3    Number of Visits 6    Date for PT Re-Evaluation 11/29/20    Authorization Type [employer], auth required    PT Start Time 0800    PT Stop Time 0845    PT Time Calculation (min) 45 min    Activity Tolerance Patient tolerated treatment well    Behavior During Therapy [facility] for tasks assessed/performed             Past Medical History:  Diagnosis Date   Arthritis    rheumatoid arthritis   Brain tumor ([place])    Chest pain    Colon polyps    Dyspepsia    Gallbladder sludge    Hypercholesterolemia    Hypertension    Left atrial dilatation    Mild   Mild aortic sclerosis    without stenosis   Mitral annular calcification    Mild   Mitral valve disorder    Mildly thickened mitral valve, but with normal leaflet but with normal leaflet    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   Subjective Assessment - 11/01/20 0806     Subjective Pt notes difficulty performing bed exercises due to soft mattress.  Been trying to be more active.    Currently in Pain? Yes    Pain Score 2     Pain Location Back    Pain Orientation Lower;Right    Pain Descriptors / Indicators Tightness    Pain Type Chronic pain    Pain Onset 1 to 4 weeks ago                [facility] PT Assessment - 11/01/20 0001       Assessment   Medical Diagnosis back pain    Referring Provider (PT) Susy Frizzle, MD                            [facility] Adult PT Treatment/Exercise - 11/01/20 0001       Exercises   Exercises Lumbar      Knee/Hip Exercises: Aerobic   Nustep level 2 x 7 min for dynamic warmup      Knee/Hip Exercises: Standing   Other Standing Knee Exercises sidestepping with green t-loop x 2 min      Knee/Hip Exercises: Seated   Long Arc Quad Strengthening;Both;1 set;15 reps    Long Arc Quad Limitations green t-loop    Ball Squeeze 3x10    Other Seated Knee/Hip Exercises trunk rotations 15 5" holds bilateral, trunk extension and flexion x10 5" holds    Sit to [employer] 15 reps;without UE support   5 lbs goblet squat                   PT Education - 11/01/20 0923  Education Details education/demonstration of new HEP additions    Person(s) Educated Patient    Methods Explanation;Handout    Comprehension Verbalized understanding;Returned demonstration              PT Short Term Goals - 11/01/20 0840       PT SHORT TERM GOAL #1   Title Patient will be independent with HEP in order to improve functional outcomes.    Time 3    Period Weeks    Status On-going    Target Date 11/08/20      PT SHORT TERM GOAL #2   Title Patient will report at least 25% improvement in symptoms for improved quality of life.    Time 3    Period Weeks    Status On-going    Target Date 11/08/20      PT SHORT TERM GOAL #3   Title Patient will be able to ambulate at least 300 feet in 2MWT in order to demonstrate improved gait speed for community ambulation.    Baseline 225 ft w/ cane    Time 3    Period Weeks    Status On-going    Target Date 11/08/20               PT Long Term Goals - 10/18/20 1135       PT LONG TERM GOAL #1   Title Patient will report at least 50% improvement in symptoms for improved quality of life.    Time 6    Period Weeks    Status New    Target Date 11/29/20      PT LONG TERM GOAL #2   Title Demo core strength to 3+/5 to  improve stability and decrease pain with rolling    Baseline 2+/5 flexion/extension strength, 4/10 pain with rolling in bed    Time 6    Period Weeks    Status New    Target Date 11/29/20      PT LONG TERM GOAL #3   Title Patient will improve FOTO score by at least 5 points in order to indicate improved tolerance to activity.    Baseline 76% function    Time 6    Period Weeks    Status New    Target Date 11/29/20                   Plan - 11/01/20 0839     Clinical Impression Statement Tolerating new HEP additions well for seated and standing strengthening exercises without adverse effects.  Pt educated in benefits of general strenthening exercises to improve function and activity tolerance.  Continued POC indicated to progress home-based HEP/PRE to improve mobility and reduce risk for falls    Personal Factors and Comorbidities Age    Examination-Activity Limitations Lift;Stairs;Squat;Bed Mobility;Locomotion Level    Examination-Participation Restrictions Community Activity;Yard Work    Stability/Clinical Decision Making Stable/Uncomplicated    Rehab Potential Good    PT Frequency 1x / week    PT Duration 6 weeks    PT Treatment/Interventions ADLs/Self Care Home Management;Aquatic Therapy;Electrical Stimulation;DME Instruction;Ultrasound;Traction;Moist Heat;Gait training;Stair training;Functional mobility training;Therapeutic activities;Therapeutic exercise;Balance training;Patient/family education;Neuromuscular re-education;Manual techniques;Passive range of motion;Taping;Dry needling;Spinal Manipulations;Joint Manipulations    PT Next Visit Plan core stabilization    PT Home Exercise Plan LTR, PPT, heel slides for right knee; 6/14 STS, AAROM knee flexion, knee extension    Consulted and Agree with Plan of Care Patient  Patient will benefit from skilled therapeutic intervention in order to improve the following deficits and impairments:  Abnormal gait,  Decreased activity tolerance, Decreased endurance, Decreased range of motion, Decreased strength, Difficulty walking, Postural dysfunction, Improper body mechanics, Obesity, Pain  Visit Diagnosis: Right-sided low back pain without sciatica, unspecified chronicity  Difficulty in walking, not elsewhere classified  Muscle weakness (generalized)     Problem List Patient Active Problem List   Diagnosis Date Noted   Atherosclerosis of aorta ([place]) 05/04/2015   Chest pain 01/26/2011   HTN (hypertension)    HLD (hyperlipidemia)    Morbid obesity ([place])    Gallbladder sludge    Brain tumor ([place])    PMR (polymyalgia rheumatica) ([facility])    Retinopathy    Colon polyps    Rheumatoid arthritis ([place])    Helicobacter pylori (H. pylori) 12/21/2010   8:45 AM, 11/01/20 M. Sherlyn Lees, PT, DPT Physical Therapist- [place] Office Number: [phone]   [place] [address] [place], [place], [address] Phone: [phone]   Fax:  [phone]  Name: [name] MRN: [id number] Date of Birth: 05-Jul-1940

## 2020-11-01 NOTE — Patient Instructions (Signed)
Access Code: [email or web address] URL: [email or web address] Date: 11/01/2020 Prepared by: Sherlyn Lees  Exercises Side Stepping with Resistance at Thighs and Counter Support - 1 x daily - 7 x weekly Sit to Stand with Resistance Around Legs - 1 x daily - 7 x weekly - 3 sets - 10 reps Seated Knee Extension with Resistance - 1 x daily - 7 x weekly - 3 sets - 10 reps Seated Hip Adduction Squeeze with Ball - 1 x daily - 7 x weekly - 3 sets - 10 reps - 2 sec hold

## 2020-11-09 ENCOUNTER — Ambulatory Visit (HOSPITAL_COMMUNITY): Payer: Medicare HMO | Admitting: Physical Therapy

## 2020-11-09 ENCOUNTER — Encounter (HOSPITAL_COMMUNITY): Payer: Self-pay | Admitting: Physical Therapy

## 2020-11-09 ENCOUNTER — Other Ambulatory Visit: Payer: Self-pay

## 2020-11-09 DIAGNOSIS — R262 Difficulty in walking, not elsewhere classified: Secondary | ICD-10-CM

## 2020-11-09 DIAGNOSIS — M6281 Muscle weakness (generalized): Secondary | ICD-10-CM

## 2020-11-09 DIAGNOSIS — M545 Low back pain, unspecified: Secondary | ICD-10-CM | POA: Diagnosis not present

## 2020-11-09 NOTE — Therapy (Signed)
[place], [place], [address] Phone: [phone]   Fax:  [phone]  Physical Therapy Treatment  Patient Details  Name: [name] MRN: [id number] Date of Birth: 25-May-1940 Referring Provider (PT): Susy Frizzle, MD   Encounter Date: 11/09/2020   PT End of Session - 11/09/20 0807     Visit Number 4    Number of Visits 6    Date for PT Re-Evaluation 11/29/20    Authorization Type [employer], auth required    PT Start Time 0831    PT Stop Time 0910    PT Time Calculation (min) 39 min    Activity Tolerance Patient tolerated treatment well    Behavior During Therapy [facility] for tasks assessed/performed             Past Medical History:  Diagnosis Date   Arthritis    rheumatoid arthritis   Brain tumor ([place])    Chest pain    Colon polyps    Dyspepsia    Gallbladder sludge    Hypercholesterolemia    Hypertension    Left atrial dilatation    Mild   Mild aortic sclerosis    without stenosis   Mitral annular calcification    Mild   Mitral valve disorder    Mildly thickened mitral valve, but with normal leaflet but with normal leaflet    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   Subjective Assessment - 11/09/20 0837     Subjective States that she still gets a catch occasionally in her right side. States her seated exercises are going alright. Rerports slight discomfort in her right knee    Currently in Pain? Yes    Pain Score 2     Pain Location Knee    Pain Orientation Right    Pain Descriptors / Indicators Other (Comment)   catch               [facility] PT Assessment - 11/09/20 0001       Assessment   Medical Diagnosis back pain    Referring Provider (PT) Susy Frizzle, MD                           [facility] Adult PT Treatment/Exercise - 11/09/20 0001       Lumbar Exercises: Aerobic   Nustep nu step 3 minutes level 3      Lumbar Exercises: Standing   Other Standing Lumbar Exercises hip abd with UE support 4x5 bilateral; hip extension with UE support 4x5 B      Lumbar Exercises: Seated   Sit to Stand 5 reps   with weighted yellow ball x4 sets   Other Seated Lumbar Exercises seated on dyno disc posture exercises: marching 2x 15 bilateral, static balance x4 30" holds, and kickouts 2x15 bilateral    Other Seated Lumbar Exercises lumbar rotation x30 arms crossed, lumbar flexion/extension x30                      PT Short Term Goals - 11/01/20 0840       PT SHORT TERM  GOAL #1   Title Patient will be independent with HEP in order to improve functional outcomes.    Time 3    Period Weeks    Status On-going    Target Date 11/08/20      PT SHORT TERM GOAL #2   Title Patient will report at least 25% improvement in symptoms for improved quality of life.    Time 3    Period Weeks    Status On-going    Target Date 11/08/20      PT SHORT TERM GOAL #3   Title Patient will be able to ambulate at least 300 feet in 2MWT in order to demonstrate improved gait speed for community ambulation.    Baseline 225 ft w/ cane    Time 3    Period Weeks    Status On-going    Target Date 11/08/20               PT Long Term Goals - 10/18/20 1135       PT LONG TERM GOAL #1   Title Patient will report at least 50% improvement in symptoms for improved quality of life.    Time 6    Period Weeks    Status New    Target Date 11/29/20      PT LONG TERM GOAL #2   Title Demo core strength to 3+/5 to improve stability and decrease pain with rolling    Baseline 2+/5 flexion/extension strength, 4/10 pain with rolling in bed    Time 6    Period Weeks    Status New    Target Date 11/29/20      PT LONG TERM GOAL #3   Title  Patient will improve FOTO score by at least 5 points in order to indicate improved tolerance to activity.    Baseline 76% function    Time 6    Period Weeks    Status New    Target Date 11/29/20                   Plan - 11/09/20 0807     Clinical Impression Statement Patient tolerated seated dyno disc exercises well but required cues to keep from leaning posteriorly. improved balance and core strength noted with reception. Continued with LE strengthening as tolerated but monitored right knee symptoms throughout session. No pain but "pocket of something" felt in right knee with LAQs. Fatigue noted end of session.    Personal Factors and Comorbidities Age    Examination-Activity Limitations Lift;Stairs;Squat;Bed Mobility;Locomotion Level    Examination-Participation Restrictions Community Activity;Yard Work    Stability/Clinical Decision Making Stable/Uncomplicated    Rehab Potential Good    PT Frequency 1x / week    PT Duration 6 weeks    PT Treatment/Interventions ADLs/Self Care Home Management;Aquatic Therapy;Electrical Stimulation;DME Instruction;Ultrasound;Traction;Moist Heat;Gait training;Stair training;Functional mobility training;Therapeutic activities;Therapeutic exercise;Balance training;Patient/family education;Neuromuscular re-education;Manual techniques;Passive range of motion;Taping;Dry needling;Spinal Manipulations;Joint Manipulations    PT Next Visit Plan core stabilization    PT Home Exercise Plan LTR, PPT, heel slides for right knee; 6/14 STS, AAROM knee flexion, knee extension; 6/28 hip extension and abd    Consulted and Agree with Plan of Care Patient             Patient will benefit from skilled therapeutic intervention in order to improve the following deficits and impairments:  Abnormal gait, Decreased activity tolerance, Decreased endurance, Decreased range of motion, Decreased strength, Difficulty walking, Postural dysfunction, Improper body mechanics,  Obesity, Pain  Visit Diagnosis: Right-sided low back pain without sciatica, unspecified chronicity  Difficulty in walking, not elsewhere classified  Muscle weakness (generalized)     Problem List Patient Active Problem List   Diagnosis Date Noted   Atherosclerosis of aorta ([place]) 05/04/2015   Chest pain 01/26/2011   HTN (hypertension)    HLD (hyperlipidemia)    Morbid obesity ([place])    Gallbladder sludge    Brain tumor ([place])    PMR (polymyalgia rheumatica) ([place])    Retinopathy    Colon polyps    Rheumatoid arthritis ([place])    Helicobacter pylori (H. pylori) 12/21/2010   9:10 AM, 11/09/20 Jerene Pitch, DPT Physical Therapy with [facility]  [phone] office   [place], [place], [address] Phone: [phone]   Fax:  [phone]  Name: [name] MRN: [id number] Date of Birth: Nov 05, 1940

## 2020-11-16 ENCOUNTER — Ambulatory Visit (HOSPITAL_COMMUNITY): Payer: Medicare HMO | Attending: Family Medicine

## 2020-11-16 ENCOUNTER — Other Ambulatory Visit: Payer: Self-pay

## 2020-11-16 ENCOUNTER — Encounter (HOSPITAL_COMMUNITY): Payer: Self-pay

## 2020-11-16 DIAGNOSIS — M6281 Muscle weakness (generalized): Secondary | ICD-10-CM | POA: Diagnosis not present

## 2020-11-16 DIAGNOSIS — M545 Low back pain, unspecified: Secondary | ICD-10-CM | POA: Diagnosis not present

## 2020-11-16 DIAGNOSIS — R262 Difficulty in walking, not elsewhere classified: Secondary | ICD-10-CM | POA: Insufficient documentation

## 2020-11-16 NOTE — Therapy (Signed)
[place], [place], [address] Phone: [phone]   Fax:  [phone]  Physical Therapy Treatment  Patient Details  Name: [name] MRN: [id number] Date of Birth: [birth year] Referring Provider (PT): Susy Frizzle, MD   Encounter Date: 11/16/2020   PT End of Session - 11/16/20 0801     Visit Number 5    Number of Visits 6    Date for PT Re-Evaluation 11/29/20    Authorization Type [employer], auth required    PT Start Time 0805    PT Stop Time 0850    PT Time Calculation (min) 45 min    Activity Tolerance Patient tolerated treatment well    Behavior During Therapy [facility] for tasks assessed/performed             Past Medical History:  Diagnosis Date   Arthritis    rheumatoid arthritis   Brain tumor ([place])    Chest pain    Colon polyps    Dyspepsia    Gallbladder sludge    Hypercholesterolemia    Hypertension    Left atrial dilatation    Mild   Mild aortic sclerosis    without stenosis   Mitral annular calcification    Mild   Mitral valve disorder    Mildly thickened mitral valve, but with normal leaflet but with normal leaflet    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   Subjective Assessment - 11/16/20 0805     Subjective "Still get a catch in my back when I'm turning over in bed, but other than that things are going pretty good"    Currently in Pain? Yes    Pain Score 2     Pain Location Knee    Pain Orientation Right    Pain Type Chronic pain                [facility] PT Assessment - 11/16/20 0001       Assessment   Medical Diagnosis back pain    Referring Provider (PT) Susy Frizzle, MD                           [facility] Adult PT  Treatment/Exercise - 11/16/20 0001       Lumbar Exercises: Aerobic   Nustep NU-step level 2 x 5 min for dynamic warm-up      Lumbar Exercises: Standing   Other Standing Lumbar Exercises hip abduction, flexion, extension 3x10 reps with BUE support    Other Standing Lumbar Exercises paloff press with red band 2x10. Tandem stance on airex pad 3x15. Static stance on airex pad 5x10 sec eyes open/closed      Lumbar Exercises: Seated   Other Seated Lumbar Exercises seated on dyno disc posture exercises: marching 3x 15 bilateral, static balance x4 30" holds, and kickouts 3x15 bilateral holding red med ball    Other Seated Lumbar Exercises lumbar rotation x30 arms crossed, lumbar flexion/extension x30                    PT Education - 11/16/20 0849     Education Details  education on POC details and progress for HEP to increase strength focus    Person(s) Educated Patient    Methods Explanation    Comprehension Verbalized understanding              PT Short Term Goals - 11/01/20 0840       PT SHORT TERM GOAL #1   Title Patient will be independent with HEP in order to improve functional outcomes.    Time 3    Period Weeks    Status On-going    Target Date 11/08/20      PT SHORT TERM GOAL #2   Title Patient will report at least 25% improvement in symptoms for improved quality of life.    Time 3    Period Weeks    Status On-going    Target Date 11/08/20      PT SHORT TERM GOAL #3   Title Patient will be able to ambulate at least 300 feet in 2MWT in order to demonstrate improved gait speed for community ambulation.    Baseline 225 ft w/ cane    Time 3    Period Weeks    Status On-going    Target Date 11/08/20               PT Long Term Goals - 10/18/20 1135       PT LONG TERM GOAL #1   Title Patient will report at least 50% improvement in symptoms for improved quality of life.    Time 6    Period Weeks    Status New    Target Date 11/29/20      PT  LONG TERM GOAL #2   Title Demo core strength to 3+/5 to improve stability and decrease pain with rolling    Baseline 2+/5 flexion/extension strength, 4/10 pain with rolling in bed    Time 6    Period Weeks    Status New    Target Date 11/29/20      PT LONG TERM GOAL #3   Title Patient will improve FOTO score by at least 5 points in order to indicate improved tolerance to activity.    Baseline 76% function    Time 6    Period Weeks    Status New    Target Date 11/29/20                   Plan - 11/16/20 0848     Clinical Impression Statement Pt tolerating tx sessions very well and reports daily compliance with HEP for lumbar ROM/strength.  Notes continued pain with rolling over in bed but notes overall she feels much improved and stronger.  Next session will focus on updated HEP to prepare for D/C    Personal Factors and Comorbidities Age    Examination-Activity Limitations Lift;Stairs;Squat;Bed Mobility;Locomotion Level    Examination-Participation Restrictions Community Activity;Yard Work    Stability/Clinical Decision Making Stable/Uncomplicated    Rehab Potential Good    PT Frequency 1x / week    PT Duration 6 weeks    PT Treatment/Interventions ADLs/Self Care Home Management;Aquatic Therapy;Electrical Stimulation;DME Instruction;Ultrasound;Traction;Moist Heat;Gait training;Stair training;Functional mobility training;Therapeutic activities;Therapeutic exercise;Balance training;Patient/family education;Neuromuscular re-education;Manual techniques;Passive range of motion;Taping;Dry needling;Spinal Manipulations;Joint Manipulations    PT Next Visit Plan core stabilization, teach paloff press and set-up for home use    PT Home Exercise Plan LTR, PPT, heel slides for right knee; 6/14 STS, AAROM knee flexion, knee extension; 6/28 hip extension and abd    Consulted and  Agree with Plan of Care Patient             Patient will benefit from skilled therapeutic intervention in  order to improve the following deficits and impairments:  Abnormal gait, Decreased activity tolerance, Decreased endurance, Decreased range of motion, Decreased strength, Difficulty walking, Postural dysfunction, Improper body mechanics, Obesity, Pain  Visit Diagnosis: Right-sided low back pain without sciatica, unspecified chronicity  Difficulty in walking, not elsewhere classified  Muscle weakness (generalized)     Problem List Patient Active Problem List   Diagnosis Date Noted   Atherosclerosis of aorta ([place]) 05/04/2015   Chest pain 01/26/2011   HTN (hypertension)    HLD (hyperlipidemia)    Morbid obesity ([place])    Gallbladder sludge    Brain tumor ([place])    PMR (polymyalgia rheumatica) (HCC)    Retinopathy    Colon polyps    Rheumatoid arthritis ([place])    Helicobacter pylori (H. pylori) 12/21/2010   8:50 AM, 11/16/20 M. Sherlyn Lees, PT, DPT Physical Therapist- [place] Office Number: [phone]   [place] [address] [place], [place], [address] Phone: [phone]   Fax:  [phone]  Name: [name] MRN: [id number] Date of Birth: May 24, 1940

## 2020-11-18 ENCOUNTER — Telehealth: Payer: Self-pay | Admitting: Pharmacist

## 2020-11-18 NOTE — Progress Notes (Addendum)
Chronic Care Management Pharmacy Assistant   Name: [name]  MRN: [id number] DOB: [birth year]  Reason for Encounter: Disease State For HTN.   Conditions to be addressed/monitored: Hypertension, Hyperlipidemia and RA  Recent office visits:  10/04/20 Dr. Dennard Schaumann. For flank pain. No medication changes.  Recent consult visits:  None since 09/28/20  Hospital visits:  None since 09/28/20  Medications: Outpatient Encounter Medications as of 11/18/2020  Medication Sig   ACCU-CHEK AVIVA PLUS test strip TEST BLOOD SUGAR EVERY DAY   Accu-Chek Softclix Lancets lancets CHECK BLOOD SUGAR EVERY MORNING   amLODipine (NORVASC) 10 MG tablet Take 1 tablet (10 mg total) by mouth daily.   Ascorbic Acid (VITAMIN C) 1000 MG tablet Take 1,000 mg by mouth daily.   aspirin 81 MG chewable tablet Chew 81 mg by mouth daily.   atorvastatin (LIPITOR) 80 MG tablet TAKE 1/2 TABLET EVERY DAY   Blood Glucose Monitoring Suppl (ACCU-CHEK AVIVA PLUS) w/Device KIT USE AS DIRECTED TO CHECK BLOOD SUGAR   Cholecalciferol (VITAMIN D) 2000 UNITS tablet Take 1,000 Units by mouth daily.    Coenzyme Q10 (CO Q-10) 100 MG CAPS Take by mouth.   doxazosin (CARDURA) 1 MG tablet TAKE 1 TABLET (1 MG TOTAL) BY MOUTH DAILY.   famotidine (PEPCID) 20 MG tablet Take 20 mg by mouth 2 (two) times daily.   fenofibrate 54 MG tablet TAKE 1 TABLET EVERY DAY   Lancet Devices (ACCU-CHEK SOFTCLIX) lancets Check BS qd - DX- E11.9   losartan-hydrochlorothiazide (HYZAAR) 100-25 MG tablet TAKE 1 TABLET EVERY DAY   metoprolol succinate (TOPROL-XL) 50 MG 24 hr tablet TAKE 1 TABLET EVERY DAY WITH OR IMMEDIATELY FOLLOWING A MEAL.   naproxen sodium (ALEVE) 220 MG tablet Take 220 mg by mouth daily as needed (knee pain).   Omega-3 Fatty Acids (FISH OIL CONCENTRATE PO) Take by mouth.   Polyethyl Glycol-Propyl Glycol 0.4-0.3 % SOLN Apply to eye.   polyethylene glycol (MIRALAX / GLYCOLAX) packet Take 17 g by mouth daily.   pyridOXINE (VITAMIN B-6)  100 MG tablet Take 100 mg by mouth daily as needed.   No facility-administered encounter medications on file as of 11/18/2020.    Reviewed chart prior to disease state call. Spoke with patient regarding BP  Recent Office Vitals: BP Readings from Last 3 Encounters:  10/04/20 138/84  08/09/20 136/70  06/08/20 (!) 150/62   Pulse Readings from Last 3 Encounters:  10/04/20 80  08/09/20 80  06/08/20 75    Wt Readings from Last 3 Encounters:  10/04/20 247 lb (112 kg)  08/09/20 251 lb (113.9 kg)  06/08/20 251 lb (113.9 kg)     Kidney Function Lab Results  Component Value Date/Time   CREATININE 0.94 (H) 08/09/2020 12:13 PM   CREATININE 0.91 06/08/2020 09:33 AM   GFRNONAA 58 (L) 08/09/2020 12:13 PM   GFRAA 67 08/09/2020 12:13 PM    BMP Latest Ref Rng & Units 08/09/2020 06/08/2020 12/01/2019  Glucose 65 - 99 mg/dL 113(H) 102(H) 101(H)  BUN 7 - 25 mg/dL _0 Creatinine 0.60 - 0.93 mg/dL 0.94(H) 0.91 0.84  BUN/Creat Ratio 6 - 22 (calc) 20 NOT APPLICABLE NOT APPLICABLE  Sodium 505 - 146 mmol/L 142 143 141  Potassium 3.5 - 5.3 mmol/L 4.0 3.7 3.8  Chloride 98 - 110 mmol/L 105 107 106  CO2 20 - 32 mmol/L _1 Calcium 8.6 - 10.4 mg/dL 9.9 10.3 9.7    Current antihypertensive regimen:  Amlodipine 59m Losartan-HCTZ 100-236m  Metoprolol Succinate 12m daily Doxazosin 172mdaily  How often are you checking your Blood Pressure? daily  Current home BP readings: 135/70.  What recent interventions/DTPs have been made by any provider to improve Blood Pressure control since last CPP Visit: None.  Any recent hospitalizations or ED visits since last visit with CPP? Patient stated no.  What diet changes have been made to improve Blood Pressure Control?  Patient stated he is not on any special diet but she monitors what she eats on a daily basics. Patient stated she is drinking a lot of water.   What exercise is being done to improve your Blood Pressure Control?  Patient stated she  does her physical therapy exercises at home daily.   Adherence Review: Is the patient currently on ACE/ARB medication? Losartan-hydrochlorothiazide 100-25 mg   Does the patient have >5 day gap between last estimated fill dates? Per misc rpts, no.  Star Rating Drugs: Atorvastatin 80 mg 90 DS 08/11/20, Losartan-hydrochlorothiazide 100-25 mg 90 DS 09/07/20.  Follow-Up:Pharmacist Review  VeCharlann LangeRMA Clinical Pharmacist Assistant [phone] minutes spent in review, coordination, and documentation.  Reviewed by: ChBeverly MilchPharmD Clinical Pharmacist [facility] ([phone]

## 2020-11-23 ENCOUNTER — Ambulatory Visit (HOSPITAL_COMMUNITY): Payer: Medicare HMO

## 2020-11-23 ENCOUNTER — Encounter (HOSPITAL_COMMUNITY): Payer: Self-pay

## 2020-11-23 ENCOUNTER — Other Ambulatory Visit: Payer: Self-pay

## 2020-11-23 DIAGNOSIS — M545 Low back pain, unspecified: Secondary | ICD-10-CM | POA: Diagnosis not present

## 2020-11-23 DIAGNOSIS — R262 Difficulty in walking, not elsewhere classified: Secondary | ICD-10-CM

## 2020-11-23 DIAGNOSIS — M6281 Muscle weakness (generalized): Secondary | ICD-10-CM

## 2020-11-23 NOTE — Therapy (Signed)
[place], [place], [address] Phone: [phone]   Fax:  [phone]  Physical Therapy Treatment and D/C Summary  Patient Details  Name: [name] MRN: [id number] Date of Birth: 11-Dec-1940 Referring Provider (PT): Susy Frizzle, MD  PHYSICAL THERAPY DISCHARGE SUMMARY  Visits from Start of Care: 6  Current functional level related to goals / functional outcomes: Pt notes 50% improvement in back pain symptoms and reports improve activity tolerance   Remaining deficits: Some lingering discomfort when turning over in bed. Educated on bed assist rails   Education / Equipment: HEP   Patient agrees to discharge. Patient goals were partially met. Patient is being discharged due to being pleased with the current functional level.  Encounter Date: 11/23/2020   PT End of Session - 11/23/20 0814     Visit Number 6    Number of Visits 6    Date for PT Re-Evaluation 11/29/20    Authorization Type [employer], auth required    PT Start Time 0815    PT Stop Time 0900    PT Time Calculation (min) 45 min    Activity Tolerance Patient tolerated treatment well    Behavior During Therapy WFL for tasks assessed/performed             Past Medical History:  Diagnosis Date   Arthritis    rheumatoid arthritis   Brain tumor ([place])    Chest pain    Colon polyps    Dyspepsia    Gallbladder sludge    Hypercholesterolemia    Hypertension    Left atrial dilatation    Mild   Mild aortic sclerosis    without stenosis   Mitral annular calcification    Mild   Mitral valve disorder    Mildly thickened mitral valve, but with normal leaflet but with normal leaflet    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   Subjective Assessment - 11/23/20 0813     Subjective Notes decrease in instances of "catch" in her right side when turning over in bed and arising in the AM    Currently in Pain? No/denies    Pain Score 0-No pain    Pain Location Back    Pain Orientation Right    Pain Type Chronic pain                [facility] PT Assessment - 11/23/20 0001       Assessment   Medical Diagnosis back pain    Referring Provider (PT) Susy Frizzle, MD      Observation/Other Assessments   Focus on Therapeutic Outcomes (FOTO)  67% function   difficulty in isolating tasks to back pain due to multiple orthopedic issues     Ambulation/Gait   Ambulation/Gait Yes    Ambulation/Gait Assistance 6: Modified independent (Device/Increase time)    Ambulation Distance (Feet) 300 Feet    Assistive device Straight cane    Gait Pattern Within Functional Limits    Ambulation Surface Level;Indoor    Gait Comments 2MWT  [place] Adult PT Treatment/Exercise - 11/23/20 0001       Lumbar Exercises: Aerobic   Nustep NU-step level 3 x 5 min for dynamic warm-up      Lumbar Exercises: Standing   Other Standing Lumbar Exercises lumbar flexion, extension, rotation 20x      Lumbar Exercises: Seated   Long Arc Quad on Chair Strengthening;Both;2 sets;10 reps      Knee/Hip Exercises: Seated   Other Seated Knee/Hip Exercises trunk flexion 3x10    Marching Strengthening;Both;10 reps;3 sets                    PT Education - 11/23/20 0853     Education Details discussion regarding continued physical activity/HEP. Encouraged to pursue [employer] program for continued activity/instruction    Person(s) Educated Patient    Methods Explanation    Comprehension Verbalized understanding              PT Short Term Goals - 11/23/20 0821       PT SHORT TERM GOAL #1   Title Patient will be independent with HEP in order to improve functional outcomes.     Time 3    Period Weeks    Status Achieved    Target Date 11/08/20      PT SHORT TERM GOAL #2   Title Patient will report at least 25% improvement in symptoms for improved quality of life.    Baseline 50% improvement    Time 3    Period Weeks    Status Achieved    Target Date 11/08/20      PT SHORT TERM GOAL #3   Title Patient will be able to ambulate at least 300 feet in 2MWT in order to demonstrate improved gait speed for community ambulation.    Baseline 225 ft w/ cane at start of care. 300 ft 2MWT with cane    Time 3    Period Weeks    Status Achieved    Target Date 11/08/20               PT Long Term Goals - 11/23/20 0829       PT LONG TERM GOAL #1   Title Patient will report at least 50% improvement in symptoms for improved quality of life.    Baseline 50% improvement    Time 6    Period Weeks    Status Achieved      PT LONG TERM GOAL #2   Title Demo core strength to 3+/5 to improve stability and decrease pain with rolling    Baseline 3/5 flexion/extension, minimal discomfort when rolling in bed    Time 6    Period Weeks    Status Partially Met      PT LONG TERM GOAL #3   Title Patient will improve FOTO score by at least 5 points in order to indicate improved tolerance to activity.    Baseline 76% function; 67% function with difficulty in isolating issue to back due to other orthopedic issues    Time 6    Period Weeks    Status Not Met                   Plan - 11/23/20 0854     Clinical Impression Statement Tolerated tx sessions well and reports 50% improvement in back pain since beginning therapy and demonstrates good compliance with HEP.  Able to increase gait speed/tolerance per 2MWT and demo 1 grade improvement in trunk  flexion/extension strength and full flexion ROM.  D/C to HEP and f/u with MD prn    Personal Factors and Comorbidities Age    Examination-Activity Limitations Lift;Stairs;Squat;Bed Mobility;Locomotion Level     Examination-Participation Restrictions Community Activity;Yard Work    Stability/Clinical Decision Making Stable/Uncomplicated    Rehab Potential Good    PT Frequency 1x / week    PT Duration 6 weeks    PT Treatment/Interventions ADLs/Self Care Home Management;Aquatic Therapy;Electrical Stimulation;DME Instruction;Ultrasound;Traction;Moist Heat;Gait training;Stair training;Functional mobility training;Therapeutic activities;Therapeutic exercise;Balance training;Patient/family education;Neuromuscular re-education;Manual techniques;Passive range of motion;Taping;Dry needling;Spinal Manipulations;Joint Manipulations    PT Next Visit Plan core stabilization, teach paloff press and set-up for home use    PT Home Exercise Plan LTR, PPT, heel slides for right knee; 6/14 STS, AAROM knee flexion, knee extension; 6/28 hip extension and abd    Consulted and Agree with Plan of Care Patient             Patient will benefit from skilled therapeutic intervention in order to improve the following deficits and impairments:  Abnormal gait, Decreased activity tolerance, Decreased endurance, Decreased range of motion, Decreased strength, Difficulty walking, Postural dysfunction, Improper body mechanics, Obesity, Pain  Visit Diagnosis: Right-sided low back pain without sciatica, unspecified chronicity  Difficulty in walking, not elsewhere classified  Muscle weakness (generalized)     Problem List Patient Active Problem List   Diagnosis Date Noted   Atherosclerosis of aorta ([place]) 05/04/2015   Chest pain 01/26/2011   HTN (hypertension)    HLD (hyperlipidemia)    Morbid obesity (HCC)    Gallbladder sludge    Brain tumor ([place])    PMR (polymyalgia rheumatica) (HCC)    Retinopathy    Colon polyps    Rheumatoid arthritis ([place])    Helicobacter pylori (H. pylori) 12/21/2010   8:57 AM, 11/23/20 M. Sherlyn Lees, PT, DPT Physical Therapist- [place] Office Number: [phone]   [place] [address] [place], [place], [address] Phone: [phone]   Fax:  [phone]  Name: KEYUNDRA FANT MRN: [id number] Date of Birth: 21-Apr-1941

## 2020-11-30 ENCOUNTER — Encounter (HOSPITAL_COMMUNITY): Payer: Medicare HMO

## 2021-01-27 ENCOUNTER — Other Ambulatory Visit: Payer: Self-pay

## 2021-01-27 ENCOUNTER — Ambulatory Visit (INDEPENDENT_AMBULATORY_CARE_PROVIDER_SITE_OTHER): Payer: Medicare HMO | Admitting: Family Medicine

## 2021-01-27 ENCOUNTER — Encounter: Payer: Self-pay | Admitting: Family Medicine

## 2021-01-27 VITALS — BP 140/78 | HR 66 | Temp 98.4°F | Resp 14 | Ht 64.0 in | Wt 247.0 lb

## 2021-01-27 DIAGNOSIS — R7303 Prediabetes: Secondary | ICD-10-CM | POA: Diagnosis not present

## 2021-01-27 DIAGNOSIS — E7800 Pure hypercholesterolemia, unspecified: Secondary | ICD-10-CM | POA: Diagnosis not present

## 2021-01-27 DIAGNOSIS — M353 Polymyalgia rheumatica: Secondary | ICD-10-CM | POA: Diagnosis not present

## 2021-01-27 DIAGNOSIS — I10 Essential (primary) hypertension: Secondary | ICD-10-CM | POA: Diagnosis not present

## 2021-01-27 DIAGNOSIS — Z23 Encounter for immunization: Secondary | ICD-10-CM

## 2021-01-27 NOTE — Progress Notes (Signed)
Subjective:    Patient ID: [name], female    DOB: 1940/10/27, 80 y.o.   MRN: [id number] Patient is a very sweet 80 year old African-American female who presents today for a checkup.  She has a history of hypertension, hyperlipidemia, and prediabetes.  She also has a questionable history of PMR in the past.  She denies any chest pain shortness of breath or dyspnea on exertion.  Her blood pressure today is well controlled and her blood pressures at home show blood pressures in the 120-140/70-80 range.  However she reports aching pain in both of her shoulders.  She has pain with passive range of motion.  She is tender to palpation of the triceps and in the biceps bilaterally.  She is on 80 mg a day of Lipitor and the question is whether this is statin induced myopathy versus PMR versus subacromial bursitis.  Otherwise she is doing well with no concerns.  She denies any abdominal pain nausea vomiting constipation or diarrhea. 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 90 tablet 3   Ascorbic  Acid (VITAMIN C) 1000 MG tablet Take 1,000 mg by mouth daily.     aspirin 81 MG chewable tablet Chew 81 mg by mouth daily.     atorvastatin (LIPITOR) 80 MG tablet TAKE 1/2 TABLET EVERY DAY 45 tablet 3   Blood Glucose Monitoring Suppl (ACCU-CHEK AVIVA PLUS) w/Device KIT USE AS DIRECTED TO CHECK BLOOD SUGAR 1 kit 0   Cholecalciferol (VITAMIN D) 2000 UNITS tablet Take 1,000 Units by mouth daily.      Coenzyme Q10 (CO Q-10) 100 MG CAPS Take by mouth.     doxazosin (CARDURA) 1 MG tablet TAKE 1 TABLET (1 MG TOTAL) BY MOUTH DAILY. 90 tablet 3   famotidine (PEPCID) 20 MG tablet Take 20 mg by mouth 2 (two) times daily.     fenofibrate 54 MG tablet TAKE 1 TABLET EVERY DAY 90 tablet 3   Lancet Devices (ACCU-CHEK SOFTCLIX) lancets Check BS qd - DX- E11.9 1 each 5   losartan-hydrochlorothiazide (HYZAAR) 100-25 MG tablet TAKE 1 TABLET EVERY DAY 90 tablet 1   metoprolol succinate (TOPROL-XL) 50 MG 24 hr tablet TAKE 1 TABLET EVERY DAY WITH OR IMMEDIATELY FOLLOWING A MEAL. 90 tablet 3   naproxen sodium (ALEVE) 220 MG tablet Take 220 mg by mouth daily as needed (  knee pain).     Omega-3 Fatty Acids (FISH OIL CONCENTRATE PO) Take by mouth.     Polyethyl Glycol-Propyl Glycol 0.4-0.3 % SOLN Apply to eye.     polyethylene glycol (MIRALAX / GLYCOLAX) packet Take 17 g by mouth daily.     pyridOXINE (VITAMIN B-6) 100 MG tablet Take 100 mg by mouth daily as needed.     No current facility-administered medications on file prior to visit.   Allergies  Allergen Reactions   Vytorin [Ezetimibe-Simvastatin] Other (See Comments)    myalgia   Social History   Socioeconomic History   Marital status: Widowed    Spouse name: Not on file   Number of children: Not on file   Years of education: Not on file   Highest education level: Not on file  Occupational History   Not on file  Tobacco Use   Smoking status: Former    Types: Cigarettes    Quit date: 09/07/1973    Years since quitting: 47.4   Smokeless tobacco: Never   Vaping Use   Vaping Use: Never used  Substance and Sexual Activity   Alcohol use: No   Drug use: No   Sexual activity: Not Currently  Other Topics Concern   Not on file  Social History Narrative   Not on file   Social Determinants of Health   Financial Resource Strain: Not on file  Food Insecurity: Not on file  Transportation Needs: Not on file  Physical Activity: Not on file  Stress: Not on file  Social Connections: Not on file  Intimate Partner Violence: Not on file    Family History  Problem Relation Age of Onset   Stroke Paternal Grandmother    Diabetes Mother    Diabetes Sister    Diabetes Grandchild      Review of Systems  All other systems reviewed and are negative.     Objective:   Physical Exam Vitals reviewed.  Constitutional:      General: She is not in acute distress.    Appearance: Normal appearance. She is well-developed. She is obese. She is not ill-appearing, toxic-appearing or diaphoretic.  HENT:     Head: Normocephalic and atraumatic.     Right Ear: Tympanic membrane, ear canal and external ear normal. There is no impacted cerumen.     Left Ear: Tympanic membrane, ear canal and external ear normal. There is no impacted cerumen.     Nose: Nose normal. No congestion or rhinorrhea.     Mouth/Throat:     Mouth: Mucous membranes are moist.     Pharynx: Oropharynx is clear. No oropharyngeal exudate.  Eyes:     General: No scleral icterus.       Right eye: No discharge.        Left eye: No discharge.     Extraocular Movements: Extraocular movements intact.     Conjunctiva/sclera: Conjunctivae normal.     Pupils: Pupils are equal, round, and reactive to light.  Neck:     Thyroid: No thyromegaly.     Vascular: No JVD.  Cardiovascular:     Rate and Rhythm: Normal rate and regular rhythm.     Pulses: Normal pulses.     Heart sounds: Murmur heard.    No friction rub. No gallop.  Pulmonary:     Effort: Pulmonary effort is normal. No respiratory  distress.     Breath sounds: Normal breath sounds. No stridor. No wheezing, rhonchi or rales.  Chest:  Chest wall: No tenderness.  Abdominal:     General: Bowel sounds are decreased. There is no distension.     Palpations: Abdomen is soft. There is no mass.     Tenderness: There is no abdominal tenderness. There is no guarding or rebound.     Hernia: No hernia is present.  Musculoskeletal:     Cervical back: Neck supple. No rigidity or tenderness.     Right lower leg: No edema.     Left lower leg: No edema.  Lymphadenopathy:     Cervical: No cervical adenopathy.  Skin:    General: Skin is warm.     Coloration: Skin is not jaundiced or pale.     Findings: No bruising, erythema, lesion or rash.  Neurological:     General: No focal deficit present.     Mental Status: She is alert and oriented to person, place, and time. Mental status is at baseline.     Cranial Nerves: No cranial nerve deficit.     Sensory: No sensory deficit.     Motor: No weakness.     Coordination: Coordination normal.     Gait: Gait normal.     Deep Tendon Reflexes: Reflexes normal.  Psychiatric:        Mood and Affect: Mood normal.        Behavior: Behavior normal.        Thought Content: Thought content normal.        Judgment: Judgment normal.          Assessment & Plan:  Need for immunization against influenza - Plan: Flu Vaccine QUAD High Dose(Fluad)  PMR (polymyalgia rheumatica) (HCC) - Plan: Sedimentation rate  Pure hypercholesterolemia  Prediabetes - Plan: CBC with Differential/Platelet, COMPLETE METABOLIC PANEL WITH GFR, Lipid panel, Hemoglobin A1c  Essential hypertension - Plan: CBC with Differential/Platelet, COMPLETE METABOLIC PANEL WITH GFR, Lipid panel, Hemoglobin A1c Blood pressure today is excellent.  No changes in antihypertensives.  Check CBC CMP lipid panel and A1c.  Goal A1c is less than 6.5.  Goal LDL cholesterol is less than 100.  Given the shoulder pain I will check a  sedimentation rate to see if there is any evidence of PMR.  Meanwhile temporarily hold Lipitor and recheck in 1 week to see if pain is improving.  If sed rate is normal and pain does not improve off Lipitor, consider empiric subacromial cortisone injections to see if this [name] be subacromial bursitis

## 2021-01-28 LAB — COMPLETE METABOLIC PANEL WITH GFR
AG Ratio: 1.5 (calc) (ref 1.0–2.5)
ALT: 19 U/L (ref 6–29)
AST: 19 U/L (ref 10–35)
Albumin: 4.4 g/dL (ref 3.6–5.1)
Alkaline phosphatase (APISO): 39 U/L (ref 37–153)
BUN: 22 mg/dL (ref 7–25)
CO2: 28 mmol/L (ref 20–32)
Calcium: 10.3 mg/dL (ref 8.6–10.4)
Chloride: 106 mmol/L (ref 98–110)
Creat: 0.8 mg/dL (ref 0.60–1.00)
Globulin: 3 g/dL (calc) (ref 1.9–3.7)
Glucose, Bld: 93 mg/dL (ref 65–99)
Potassium: 4 mmol/L (ref 3.5–5.3)
Sodium: 142 mmol/L (ref 135–146)
Total Bilirubin: 0.5 mg/dL (ref 0.2–1.2)
Total Protein: 7.4 g/dL (ref 6.1–8.1)
eGFR: 75 mL/min/{1.73_m2} (ref >=60)

## 2021-01-28 LAB — CBC WITH DIFFERENTIAL/PLATELET
Absolute Monocytes: 516 cells/uL (ref 200–950)
Basophils Absolute: 40 cells/uL (ref 0–200)
Basophils Relative: 0.6 %
Eosinophils Absolute: 194 cells/uL (ref 15–500)
Eosinophils Relative: 2.9 %
HCT: 37.5 % (ref 35.0–45.0)
Hemoglobin: 12.6 g/dL (ref 11.7–15.5)
Lymphs Abs: 2915 cells/uL (ref 850–3900)
MCH: 27.3 pg (ref 27.0–33.0)
MCHC: 33.6 g/dL (ref 32.0–36.0)
MCV: 81.2 fL (ref 80.0–100.0)
MPV: 10.8 fL (ref 7.5–12.5)
Monocytes Relative: 7.7 %
Neutro Abs: 3035 cells/uL (ref 1500–7800)
Neutrophils Relative %: 45.3 %
Platelets: 320 10*3/uL (ref 140–400)
RBC: 4.62 10*6/uL (ref 3.80–5.10)
RDW: 14.5 % (ref 11.0–15.0)
Total Lymphocyte: 43.5 %
WBC: 6.7 10*3/uL (ref 3.8–10.8)

## 2021-01-28 LAB — LIPID PANEL
Cholesterol: 178 mg/dL (ref <200)
HDL: 60 mg/dL (ref >=50)
LDL Cholesterol (Calc): 103 mg/dL (calc) — ABNORMAL HIGH
Non-HDL Cholesterol (Calc): 118 mg/dL (calc) (ref <130)
Total CHOL/HDL Ratio: 3 (calc) (ref <5.0)
Triglycerides: 60 mg/dL (ref <150)

## 2021-01-28 LAB — HEMOGLOBIN A1C
Hgb A1c MFr Bld: 5.7 % of total Hgb — ABNORMAL HIGH (ref <5.7)
Mean Plasma Glucose: 117 mg/dL
eAG (mmol/L): 6.5 mmol/L

## 2021-01-28 LAB — SEDIMENTATION RATE: Sed Rate: 19 mm/h (ref 0–30)

## 2021-01-31 ENCOUNTER — Telehealth: Payer: Self-pay

## 2021-02-03 ENCOUNTER — Telehealth: Payer: Self-pay | Admitting: *Deleted

## 2021-02-03 NOTE — Telephone Encounter (Signed)
Received call from patient.   Reports that she was seen on 01/27/2021 for B shoulder and arm pain.   States that she has help Lipitor since then and has noted marked improvement in her arm pain.   Please advise.

## 2021-02-04 NOTE — Telephone Encounter (Signed)
Patient in office and made aware.

## 2021-02-09 ENCOUNTER — Other Ambulatory Visit: Payer: Self-pay | Admitting: Family Medicine

## 2021-02-10 DIAGNOSIS — Z0100 Encounter for examination of eyes and vision without abnormal findings: Secondary | ICD-10-CM | POA: Diagnosis not present

## 2021-02-10 DIAGNOSIS — H5200 Hypermetropia, unspecified eye: Secondary | ICD-10-CM | POA: Diagnosis not present

## 2021-03-26 ENCOUNTER — Other Ambulatory Visit: Payer: Self-pay | Admitting: Family Medicine

## 2021-03-28 ENCOUNTER — Other Ambulatory Visit: Payer: Self-pay | Admitting: Family Medicine

## 2021-04-04 ENCOUNTER — Other Ambulatory Visit (HOSPITAL_COMMUNITY): Payer: Self-pay | Admitting: Family Medicine

## 2021-04-04 DIAGNOSIS — Z1231 Encounter for screening mammogram for malignant neoplasm of breast: Secondary | ICD-10-CM

## 2021-04-20 ENCOUNTER — Other Ambulatory Visit: Payer: Self-pay | Admitting: Family Medicine

## 2021-04-26 ENCOUNTER — Telehealth: Payer: Self-pay | Admitting: Pharmacist

## 2021-04-26 NOTE — Progress Notes (Addendum)
Chronic Care Management Pharmacy Assistant   Name: [name]  MRN: [id number] DOB: 1940/07/21   Reason for Encounter: Disease State - Hypertension Call    Recent office visits:  01/27/21 [name], MD (PCP) - Family Medicine - Need for Flu vaccine - labs were ordered. Flu vaccine administered.  temporarily hold Lipitor and recheck in 1 week to see if pain is improving.  If sed rate is normal and pain does not improve off Lipitor, consider empiric subacromial cortisone injections to see if this may be subacromial bursitis Follow up as scheduled.    Recent consult visits:  02/10/21 Webb Laws, MD - Optometry - No notes available.   11/23/20 [place] back pain - No notes available.   Hospital visits:  None in previous 6 months  Medications: Outpatient Encounter Medications as of 04/26/2021  Medication Sig   ACCU-CHEK AVIVA PLUS test strip TEST BLOOD SUGAR EVERY DAY   Accu-Chek Softclix Lancets lancets CHECK BLOOD SUGAR EVERY MORNING   amLODipine (NORVASC) 10 MG tablet TAKE 1 TABLET EVERY DAY   Ascorbic Acid (VITAMIN C) 1000 MG tablet Take 1,000 mg by mouth daily.   aspirin 81 MG chewable tablet Chew 81 mg by mouth daily.   atorvastatin (LIPITOR) 80 MG tablet TAKE 1/2 TABLET EVERY DAY   Blood Glucose Monitoring Suppl (ACCU-CHEK AVIVA PLUS) w/Device KIT USE AS DIRECTED TO CHECK BLOOD SUGAR   Cholecalciferol (VITAMIN D) 2000 UNITS tablet Take 1,000 Units by mouth daily.    Coenzyme Q10 (CO Q-10) 100 MG CAPS Take by mouth.   doxazosin (CARDURA) 1 MG tablet TAKE 1 TABLET (1 MG TOTAL) BY MOUTH DAILY.   famotidine (PEPCID) 20 MG tablet Take 20 mg by mouth 2 (two) times daily.   fenofibrate 54 MG tablet TAKE 1 TABLET EVERY DAY   Lancet Devices (ACCU-CHEK SOFTCLIX) lancets Check BS qd - DX- E11.9   losartan-hydrochlorothiazide (HYZAAR) 100-25 MG tablet TAKE 1 TABLET EVERY DAY   metoprolol succinate (TOPROL-XL) 50 MG 24 hr tablet TAKE 1 TABLET  EVERY DAY WITH OR IMMEDIATELY FOLLOWING A MEAL.   naproxen sodium (ALEVE) 220 MG tablet Take 220 mg by mouth daily as needed (knee pain).   Omega-3 Fatty Acids (FISH OIL CONCENTRATE PO) Take by mouth.   Polyethyl Glycol-Propyl Glycol 0.4-0.3 % SOLN Apply to eye.   polyethylene glycol (MIRALAX / GLYCOLAX) packet Take 17 g by mouth daily.   pyridOXINE (VITAMIN B-6) 100 MG tablet Take 100 mg by mouth daily as needed.   No facility-administered encounter medications on file as of 04/26/2021.    Current antihypertensive regimen:  Amlodipine 15m Losartan-HCTZ 100-28mMetoprolol Succinate 5012maily Doxazosin 1mg60mily   How often are you checking your Blood Pressure?  Patient reported checking blood pressures daily.   Current home BP readings: 137/74 (before medicine 04/26/21)   What recent interventions/DTPs have been made by any provider to improve Blood Pressure control since last CPP Visit:  Patient denied any recent changes in current medications.    Any recent hospitalizations or ED visits since last visit with CPP?  Patient has not had any ED visits or hospitalizations since last visit with CPP.   What diet changes have been made to improve Blood Pressure Control?  Patient reported she does watch her salt intake daily in her diet.    What exercise is being done to improve your Blood Pressure Control?  Patient reported she is doing home exercises daily and getting over  a cold so has not been as active lately.     Adherence Review: Is the patient currently on ACE/ARB medication? Yes Does the patient have >5 day gap between last estimated fill dates? No   Amlodipine 37m - last filled 02/16/21 90 days  Losartan-HCTZ 100-237m- last filled 02/14/21 90 days  Metoprolol Succinate 5037maily - last filled 02/16/21 90 days   Doxazosin 1mg6mily - last filled 03/29/21 90 days    Care Gaps  AWV: overdue (message sent to scheduling office) Colonoscopy: done 01/08/14 [facility]  Exam: overdue 07/26/20 [facility] Foot Exam: N/A Microalbumin: N/A HbgAIC: done 01/27/21 (5.7) DEXA: ordered Mammogram: 04/14/21 scheduled    Star Rating Drugs: Atorvastatin 80 mg - last filled 01/26/21 90 days  Losartan-hydrochlorothiazide 100-25 mg - last filled 02/14/21 90 days   Future Appointments  Date Time Provider [place]/21/2022 10:15 AM [facility] 1 [facility] [place] [facility]  05/10/2021  8:00 AM [facility] None  05/12/2021  9:00 AM [facility] PHARMACIST [facility] None    LizaJobe GibbonMAEqualitynical Pharmacist Assistant  [phone]

## 2021-04-29 NOTE — Progress Notes (Signed)
Chronic Care Management Pharmacy Note  05/12/2021 Name:  [name]:  [id number] DOB:  1940/10/19  Subjective: [name] is an 80 y.o. year old female who is a primary patient of Pickard, Cammie Mcgee, MD.  The CCM team was consulted for assistance with disease management and care coordination needs.    Engaged with patient by telephone for follow up visit in response to provider referral for pharmacy case management and/or care coordination services.   Consent to Services:  The patient was given the following information about Chronic Care Management services today, agreed to services, and gave verbal consent: 1. CCM service includes personalized support from designated clinical staff supervised by the primary care provider, including individualized plan of care and coordination with other care providers 2. 24/7 contact phone numbers for assistance for urgent and routine care needs. 3. Service will only be billed when office clinical staff spend 20 minutes or more in a month to coordinate care. 4. Only one practitioner [name] furnish and bill the service in a calendar month. 5.The patient [name] stop CCM services at any time (effective at the end of the month) by phone call to the office staff. 6. The patient will be responsible for cost sharing (co-pay) of up to 20% of the service fee (after annual deductible is met). Patient agreed to services and consent obtained.  Patient Care Team: Taylor Frizzle, MD as PCP - General (Family Medicine) Taylor Pain, MD as PCP - Cardiology (Cardiology) [name], [name] as Pharmacist (Pharmacist)  Recent office visits:  01/27/21 Taylor Luo, MD (PCP) - Family Medicine - Need for Flu vaccine - labs were ordered. Flu vaccine administered.  temporarily hold Lipitor and recheck in 1 week to see if [name] is improving.  If sed rate is normal and [name] does not improve off Lipitor, consider empiric subacromial cortisone injections to see if  this [name] be subacromial bursitis Follow up as scheduled.      Recent consult visits:  02/10/21 Taylor Laws, MD - Optometry - No notes available.    11/23/20 [name] - No notes available.    Hospital visits:  None in previous 6 months  Objective:  Lab Results  Component Value Date   CREATININE 0.97 05/10/2021   BUN 25 05/10/2021   GFRNONAA 58 (L) 08/09/2020   GFRAA 67 08/09/2020   NA 143 05/10/2021   K 4.0 05/10/2021   CALCIUM 10.5 (H) 05/10/2021   CO2 27 05/10/2021   GLUCOSE 103 (H) 05/10/2021    Lab Results  Component Value Date/Time   HGBA1C 5.8 (H) 05/10/2021 08:25 AM   HGBA1C 5.7 (H) 01/27/2021 11:51 AM    Last diabetic Eye exam:  Lab Results  Component Value Date/Time   HMDIABEYEEXA No Retinopathy 01/17/2019 12:00 AM    Last diabetic Foot exam: No results found for: HMDIABFOOTEX   Lab Results  Component Value Date   CHOL 239 (H) 05/10/2021   HDL 62 05/10/2021   LDLCALC 158 (H) 05/10/2021   TRIG 84 05/10/2021   CHOLHDL 3.9 05/10/2021    Hepatic Function Latest Ref Rng & Units 05/10/2021 01/27/2021 08/09/2020  Total Protein 6.1 - 8.1 g/dL 7.3 7.4 7.1  Albumin 3.6 - 5.1 g/dL - - -  AST 10 - 35 U/L _0 ALT 6 - 29 U/L _1 Alk Phosphatase 33 - 130 U/L - - -  Total Bilirubin 0.2 - 1.2 mg/dL  0.3 0.5 0.4  Bilirubin, Direct 0.0 - 0.3 mg/dL - - -    Lab Results  Component Value Date/Time   TSH 5.20 (H) 06/08/2020 09:33 AM    CBC Latest Ref Rng & Units 05/10/2021 01/27/2021 08/09/2020  WBC 3.8 - 10.8 Thousand/uL 7.1 6.7 6.7  Hemoglobin 11.7 - 15.5 g/dL 12.1 12.6 11.9  Hematocrit 35.0 - 45.0 % 37.3 37.5 37.5  Platelets 140 - 400 Thousand/uL 296 320 300    No results found for: VD25OH  Clinical ASCVD: Yes  The 10-year ASCVD risk score (Arnett DK, et al., 2019) is: 49.8%   Values used to calculate the score:     Age: 84 years     Sex: Female     Is Non-Hispanic African American: Yes     Diabetic: Yes      Tobacco smoker: No     Systolic Blood Pressure: 263 mmHg     Is BP treated: Yes     HDL Cholesterol: 62 mg/dL     Total Cholesterol: 239 mg/dL    Depression screen [facility] 2/9 07/08/2019 12/06/2017 06/14/2017  Decreased Interest 0 0 0  Down, Depressed, Hopeless 0 0 0  PHQ - 2 Score 0 0 0  Altered sleeping - 0 -  Tired, decreased energy - 0 -  Change in appetite - 0 -  Feeling bad or failure about yourself  - 0 -  Trouble concentrating - 0 -  Moving slowly or fidgety/restless - 0 -  Suicidal thoughts - 0 -  PHQ-9 Score - 0 -  Difficult doing work/chores - Not difficult at all -     Social History   Tobacco Use  Smoking Status Former   Types: Cigarettes   Quit date: 09/07/1973   Years since quitting: 47.7  Smokeless Tobacco Never   BP Readings from Last 3 Encounters:  01/27/21 140/78  10/04/20 138/84  08/09/20 136/70   Pulse Readings from Last 3 Encounters:  01/27/21 66  10/04/20 80  08/09/20 80   Wt Readings from Last 3 Encounters:  01/27/21 247 lb (112 kg)  10/04/20 247 lb (112 kg)  08/09/20 251 lb (113.9 kg)   BMI Readings from Last 3 Encounters:  01/27/21 42.40 kg/m  10/04/20 42.40 kg/m  08/09/20 43.08 kg/m    Assessment/Interventions: Review of patient past medical history, allergies, medications, health status, including review of consultants reports, laboratory and other test data, was performed as part of comprehensive evaluation and provision of chronic care management services.   SDOH:  (Social Determinants of Health) assessments and interventions performed: Yes   Financial Resource Strain: Not on file    SDOH Screenings   Alcohol Screen: Not on file  Depression (PHQ2-9): Not on file  Financial Resource Strain: Not on file  Food Insecurity: Not on file  Housing: Not on file  Physical Activity: Not on file  Social Connections: Not on file  Stress: Not on file  Tobacco Use: Medium Risk   Smoking Tobacco Use: Former   Smokeless Tobacco Use: Never    Passive Exposure: Not on file  Transportation Needs: Not on file    [name]  Allergies  Allergen Reactions   Vytorin [Ezetimibe-Simvastatin] Other (See Comments)    myalgia    Medications Reviewed Today     Reviewed by [name], [facility] (Pharmacist) on 05/12/21 at [place] List Status: <None>   Medication Order Taking? Sig Documenting Provider Last Dose Status Informant  ACCU-CHEK AVIVA PLUS test strip  [id number] No TEST BLOOD SUGAR EVERY DAY Taylor Frizzle, MD Taking Active   Accu-Chek Softclix Lancets lancets [id number] No CHECK BLOOD SUGAR EVERY MORNING Taylor Frizzle, MD Taking Active   amLODipine (NORVASC) 10 MG tablet [id number]  TAKE 1 TABLET EVERY DAY Taylor Frizzle, MD  Active   Ascorbic Acid (VITAMIN C) 1000 MG tablet [id number] No Take 1,000 mg by mouth daily. [provider] Taking Active   aspirin 81 MG chewable tablet [id number] No Chew 81 mg by mouth daily. [provider] Taking Active   atorvastatin (LIPITOR) 80 MG tablet [id number]  TAKE 1/2 TABLET EVERY DAY Taylor Frizzle, MD  Active   Blood Glucose Monitoring Suppl (ACCU-CHEK AVIVA PLUS) w/Device KIT [id number] No USE AS DIRECTED TO CHECK BLOOD SUGAR Taylor Frizzle, MD Taking Active   Cholecalciferol (VITAMIN D) 2000 UNITS tablet [id number] No Take 1,000 Units by mouth daily.  [provider] Taking Active   Coenzyme Q10 (CO Q-10) 100 MG CAPS [id number] No Take by mouth. [provider] Taking Active   doxazosin (CARDURA) 1 MG tablet [id number]  TAKE 1 TABLET (1 MG TOTAL) BY MOUTH DAILY. Taylor Frizzle, MD  Active   famotidine (PEPCID) 20 MG tablet [id number] No Take 20 mg by mouth 2 (two) times daily. [provider] Taking Active   fenofibrate 54 MG tablet [id number]  TAKE 1 TABLET EVERY DAY Taylor Frizzle, MD  Active   Lancet Devices (ACCU-CHEK [place]) lancets [id number] No Check BS qd - DX- E11.9 Taylor Frizzle, MD Taking Active    losartan-hydrochlorothiazide [facility]) 100-25 MG tablet [id number]  TAKE 1 TABLET EVERY DAY Taylor Frizzle, MD  Active   metoprolol succinate (TOPROL-XL) 50 MG 24 hr tablet [id number]  TAKE 1 TABLET EVERY DAY WITH OR IMMEDIATELY FOLLOWING A MEAL. Taylor Frizzle, MD  Active   naproxen sodium (ALEVE) 220 MG tablet [id number] No Take 220 mg by mouth daily as needed (knee [name]). [provider] Taking Active   Omega-3 Fatty Acids (FISH OIL CONCENTRATE PO) [id number] No Take by mouth. [provider] Taking Active   Polyethyl Glycol-Propyl Glycol 0.4-0.3 % SOLN [id number] No Apply to eye. [provider] Taking Active   polyethylene glycol [facility] / GLYCOLAX) packet [id number] No Take 17 g by mouth daily. [provider] Taking Active   pyridOXINE (VITAMIN B-6) 100 MG tablet [id number] No Take 100 mg by mouth daily as needed. [provider] Taking Active             Patient Active Problem List   Diagnosis Date Noted   Atherosclerosis of aorta ([place]) 05/04/2015   Chest [name] 01/26/2011   HTN (hypertension)    HLD (hyperlipidemia)    Morbid obesity ([place])    Gallbladder sludge    Brain tumor ([place])    PMR (polymyalgia rheumatica) (HCC)    Retinopathy    Colon polyps    Rheumatoid arthritis ([place])    Helicobacter pylori (H. pylori) 12/21/2010    Immunization History  Administered Date(s) Administered   Fluad Quad(high Dose 65+) 01/31/2019, 01/28/2020, 01/27/2021   Influenza Whole 01/17/2010, 02/07/2012   Influenza, High Dose Seasonal PF 02/13/2017   Influenza,inj,Quad PF,6+ Mos 02/04/2013, 02/11/2014, 02/16/2015, 01/18/2016, 01/25/2018   PFIZER(Purple Top)SARS-COV-2 Vaccination 07/12/2019, 08/09/2019, 03/05/2020   Pneumococcal Conjugate-13 05/28/2014   Pneumococcal Polysaccharide-23 04/25/2005, 02/11/2013   Td 10/14/1998   Tdap 12/06/2017    Conditions to be addressed/monitored:  Hypertension, Hyperlipidemia and RA  Care Plan : General  Pharmacy (Adult)  Updates made by [name], RPH since 05/12/2021 12:00 AM     Problem: HTN, HLD, RA   Priority: High  Onset Date: 09/28/2020     Long-Range Goal: Patient-Specific Goal   Start Date: 09/28/2020  Expected End Date: 04/01/2021  Recent Progress: On track  Priority: High  Note:   Current Barriers:  Elevated LDL  Pharmacist Clinical Goal(s):  Patient will maintain control of blood pressure as evidenced by home monitoring  adhere to plan to optimize therapeutic regimen for reflux [name] as evidenced by report of adherence to recommended medication management changes contact provider office for questions/concerns as evidenced notation of same in electronic health record through collaboration with PharmD and provider.   Interventions: 1:1 collaboration with Taylor Frizzle, MD regarding development and update of comprehensive plan of care as evidenced by provider attestation and co-signature Inter-disciplinary care team collaboration (see longitudinal plan of care) Comprehensive medication review performed; medication list updated in electronic medical record  Hypertension (BP goal <140/90) -Controlled -Current treatment: Amlodipine 31m Losartan-HCTZ 100-270mMetoprolol Succinate 506maily Doxazosin 1mg7mily -Medications previously tried: none noted  -Current home readings: 135/70, 140/75 -Denies hypotensive/hypertensive symptoms -Educated on BP goals and benefits of medications for prevention of heart attack, stroke and kidney damage; Daily salt intake goal < 2300 mg; Importance of home blood pressure monitoring; -Counseled to monitor BP at home a few times per week, document, and provide log at future appointments -Patient BP seems to being doing much better overall -Denies any symptoms of hypotension -Recommended to continue current medication  Update 05/12/21 137/70s, 126/74, 147/70 - recent home readings BP controlled for the most part at  home. Did mention one episode of light headedness after exercise.  Has not happened again.  Recommend she continue to monitor and record BP at home a few times per week.  Pulse seems to be normal as well per her reports. No changes to medications, adherent to above regimen. FU on BP on 30 days with CMA.  Hyperlipidemia: (LDL goal < 100) -Controlled -Current treatment: Atorvastatin 80mg66mne-half tablet po daily (was not taking) Fenofibrate 54mg 79mtablet daily -Medications previously tried: none noted  -Educated on Cholesterol goals;  Benefits of statin for ASCVD risk reduction; Importance of limiting foods high in cholesterol;  -Reviewed most recent lipid panel, all levels controlled -Patient reports adherence with medication no adverse effects -Recommended to continue current medication  Update 05/12/21 Most recent lipids on 12/27 showed elevated LDL and TC.  LDL had increased from 103 to 158 as patient was off Lipitor for the past three months due to perceived arm [name] from medication.  She had also had a recent injury around this time that she now believes was causing the arm [name].  We discussed the importance of statins and benefits on CV event reduction.  Per PCP note on labs he recommends she resumes Lipitor 40mg d17m which was previous dose.  Patient agreeable to this plan and she has meds left over at home that she can take immediately.  She also requests a new rx for 40mg ta22mo she does not have to split. Will call in new rx to mail order for this. FU in 30 days on tolerability and adherence.  Reflux? (Goal: Control symptoms) -Not ideally controlled -Current treatment: None -Medications previously tried: none noted -Patient reports she wakes up with a [name] in her side and when she burps and gets moving it goes away. -She denies any chest  [name], etc. -Currently not taking anything for reflux.  -Recommended patient to try Pepcid OTC at bedtime to see if this helps.  If after  a few days problem still persists recommended she make appointment with Dr. Dennard Schaumann.  Update 05/12/21 Not having as many problems with reflux lately, has not had to use Pepcid. Has been taking Miralax and this has reportedly helped with her symptoms.  Does mention occasional burping in the morning that seems to make her feel better. Again, can take pepcid prn hs if she needs it. Continue to monitor.   Patient Goals/Self-Care Activities Patient will:  - take medications as prescribed focus on medication adherence by pill box check blood pressure a few times per week, document, and provide at future appointments  Follow Up Plan: The care management team will reach out to the patient again over the next 120 days.           Medication Assistance: None required.  Patient affirms current coverage meets needs.  Patient's preferred pharmacy is:  [id number] [place], [place]Phone: [phone]: [phone] [address] [place] [address]Phone: [phone]: [phone]  Uses pill box? Yes Pt endorses 100% compliance  We discussed: Benefits of medication synchronization, packaging and delivery as well as enhanced pharmacist oversight with [employer]. Patient decided to: Continue current medication management strategy  Care Plan and Follow Up Patient Decision:  Patient agrees to Care Plan and Follow-up.  Plan: The care management team will reach out to the patient again over the next 120 days.  CBeverly Milch PharmD Clinical Pharmacist [place]([phone]

## 2021-05-04 ENCOUNTER — Other Ambulatory Visit: Payer: Self-pay

## 2021-05-04 ENCOUNTER — Ambulatory Visit (HOSPITAL_COMMUNITY)
Admission: RE | Admit: 2021-05-04 | Discharge: 2021-05-04 | Disposition: A | Discharge status: Home / Self Care | Payer: Medicare HMO | Source: Ambulatory Visit | Attending: Family Medicine | Admitting: Family Medicine

## 2021-05-04 DIAGNOSIS — Z1231 Encounter for screening mammogram for malignant neoplasm of breast: Secondary | ICD-10-CM | POA: Insufficient documentation

## 2021-05-05 ENCOUNTER — Other Ambulatory Visit (HOSPITAL_COMMUNITY): Payer: Self-pay | Admitting: Family Medicine

## 2021-05-05 DIAGNOSIS — R928 Other abnormal and inconclusive findings on diagnostic imaging of breast: Secondary | ICD-10-CM

## 2021-05-10 ENCOUNTER — Other Ambulatory Visit: Payer: Medicare HMO

## 2021-05-10 ENCOUNTER — Other Ambulatory Visit: Payer: Self-pay

## 2021-05-10 DIAGNOSIS — R7303 Prediabetes: Secondary | ICD-10-CM | POA: Diagnosis not present

## 2021-05-10 DIAGNOSIS — E7800 Pure hypercholesterolemia, unspecified: Secondary | ICD-10-CM

## 2021-05-10 DIAGNOSIS — I10 Essential (primary) hypertension: Secondary | ICD-10-CM | POA: Diagnosis not present

## 2021-05-11 LAB — CBC WITH DIFFERENTIAL/PLATELET
Absolute Monocytes: 547 cells/uL (ref 200–950)
Basophils Absolute: 28 cells/uL (ref 0–200)
Basophils Relative: 0.4 %
Eosinophils Absolute: 178 cells/uL (ref 15–500)
Eosinophils Relative: 2.5 %
HCT: 37.3 % (ref 35.0–45.0)
Hemoglobin: 12.1 g/dL (ref 11.7–15.5)
Lymphs Abs: 3188 cells/uL (ref 850–3900)
MCH: 26.4 pg — ABNORMAL LOW (ref 27.0–33.0)
MCHC: 32.4 g/dL (ref 32.0–36.0)
MCV: 81.3 fL (ref 80.0–100.0)
MPV: 10.7 fL (ref 7.5–12.5)
Monocytes Relative: 7.7 %
Neutro Abs: 3160 cells/uL (ref 1500–7800)
Neutrophils Relative %: 44.5 %
Platelets: 296 10*3/uL (ref 140–400)
RBC: 4.59 10*6/uL (ref 3.80–5.10)
RDW: 15.1 % — ABNORMAL HIGH (ref 11.0–15.0)
Total Lymphocyte: 44.9 %
WBC: 7.1 10*3/uL (ref 3.8–10.8)

## 2021-05-11 LAB — COMPREHENSIVE METABOLIC PANEL
AG Ratio: 1.4 (calc) (ref 1.0–2.5)
ALT: 13 U/L (ref 6–29)
AST: 15 U/L (ref 10–35)
Albumin: 4.2 g/dL (ref 3.6–5.1)
Alkaline phosphatase (APISO): 34 U/L — ABNORMAL LOW (ref 37–153)
BUN: 25 mg/dL (ref 7–25)
CO2: 27 mmol/L (ref 20–32)
Calcium: 10.5 mg/dL — ABNORMAL HIGH (ref 8.6–10.4)
Chloride: 108 mmol/L (ref 98–110)
Creat: 0.97 mg/dL (ref 0.60–1.00)
Globulin: 3.1 g/dL (calc) (ref 1.9–3.7)
Glucose, Bld: 103 mg/dL — ABNORMAL HIGH (ref 65–99)
Potassium: 4 mmol/L (ref 3.5–5.3)
Sodium: 143 mmol/L (ref 135–146)
Total Bilirubin: 0.3 mg/dL (ref 0.2–1.2)
Total Protein: 7.3 g/dL (ref 6.1–8.1)

## 2021-05-11 LAB — LIPID PANEL
Cholesterol: 239 mg/dL — ABNORMAL HIGH (ref <200)
HDL: 62 mg/dL (ref >=50)
LDL Cholesterol (Calc): 158 mg/dL (calc) — ABNORMAL HIGH
Non-HDL Cholesterol (Calc): 177 mg/dL (calc) — ABNORMAL HIGH (ref <130)
Total CHOL/HDL Ratio: 3.9 (calc) (ref <5.0)
Triglycerides: 84 mg/dL (ref <150)

## 2021-05-11 LAB — HEMOGLOBIN A1C
Hgb A1c MFr Bld: 5.8 % of total Hgb — ABNORMAL HIGH (ref <5.7)
Mean Plasma Glucose: 120 mg/dL
eAG (mmol/L): 6.6 mmol/L

## 2021-05-12 ENCOUNTER — Ambulatory Visit (INDEPENDENT_AMBULATORY_CARE_PROVIDER_SITE_OTHER): Payer: Medicare HMO | Admitting: Pharmacist

## 2021-05-12 DIAGNOSIS — I159 Secondary hypertension, unspecified: Secondary | ICD-10-CM

## 2021-05-12 DIAGNOSIS — E7800 Pure hypercholesterolemia, unspecified: Secondary | ICD-10-CM

## 2021-05-12 MED ORDER — ATORVASTATIN CALCIUM 40 MG PO TABS
40.0000 mg | ORAL_TABLET | Freq: Every day | ORAL | 3 refills | Status: DC
Start: 2021-05-12 — End: 2021-12-29

## 2021-05-12 NOTE — Patient Instructions (Addendum)
Visit Information   Goals Addressed             This Visit's Progress    LDL < 100       Timeframe:  Long-Range Goal Priority:  High Start Date:   05/12/21                          Expected End Date:  11/10/21                     Follow Up Date 08/10/21    Aim for LDL < 100   Why is this important?   These steps will help you keep on track with your medicines.   Notes: Resumed Lipitor 05/12/21       Patient Care Plan: General Pharmacy (Adult)     Problem Identified: HTN, HLD, RA   Priority: High  Onset Date: 09/28/2020     Long-Range Goal: Patient-Specific Goal   Start Date: 09/28/2020  Expected End Date: 04/01/2021  Recent Progress: On track  Priority: High  Note:   Current Barriers:  Elevated LDL  Pharmacist Clinical Goal(s):  Patient will maintain control of blood pressure as evidenced by home monitoring  adhere to plan to optimize therapeutic regimen for reflux pain as evidenced by report of adherence to recommended medication management changes contact provider office for questions/concerns as evidenced notation of same in electronic health record through collaboration with PharmD and provider.   Interventions: 1:1 collaboration with Susy Frizzle, MD regarding development and update of comprehensive plan of care as evidenced by provider attestation and co-signature Inter-disciplinary care team collaboration (see longitudinal plan of care) Comprehensive medication review performed; medication list updated in electronic medical record  Hypertension (BP goal <140/90) -Controlled -Current treatment: Amlodipine 10mg  Losartan-HCTZ 100-25mg  Metoprolol Succinate 50mg  daily Doxazosin 1mg  daily -Medications previously tried: none noted  -Current home readings: 135/70, 140/75 -Denies hypotensive/hypertensive symptoms -Educated on BP goals and benefits of medications for prevention of heart attack, stroke and kidney damage; Daily salt intake goal < 2300  mg; Importance of home blood pressure monitoring; -Counseled to monitor BP at home a few times per week, document, and provide log at future appointments -Patient BP seems to being doing much better overall -Denies any symptoms of hypotension -Recommended to continue current medication  Update 05/12/21 137/70s, 126/74, 147/70 - recent home readings BP controlled for the most part at home. Did mention one episode of light headedness after exercise.  Has not happened again.  Recommend she continue to monitor and record BP at home a few times per week.  Pulse seems to be normal as well per her reports. No changes to medications, adherent to above regimen. FU on BP on 30 days with CMA.  Hyperlipidemia: (LDL goal < 100) -Controlled -Current treatment: Atorvastatin 80mg  - one-half tablet po daily (was not taking) Fenofibrate 54mg  one tablet daily -Medications previously tried: none noted  -Educated on Cholesterol goals;  Benefits of statin for ASCVD risk reduction; Importance of limiting foods high in cholesterol;  -Reviewed most recent lipid panel, all levels controlled -Patient reports adherence with medication no adverse effects -Recommended to continue current medication  Update 05/12/21 Most recent lipids on 12/27 showed elevated LDL and TC.  LDL had increased from 103 to 158 as patient was off Lipitor for the past three months due to perceived arm pain from medication.  She had also had a recent injury around this time that she now  believes was causing the arm pain.  We discussed the importance of statins and benefits on CV event reduction.  Per PCP note on labs he recommends she resumes Lipitor 40mg  daily which was previous dose.  Patient agreeable to this plan and she has meds left over at home that she can take immediately.  She also requests a new rx for 40mg  tabs so she does not have to split. Will call in new rx to mail order for this. FU in 30 days on tolerability and  adherence.  Reflux? (Goal: Control symptoms) -Not ideally controlled -Current treatment: None -Medications previously tried: none noted -Patient reports she wakes up with a pain in her side and when she burps and gets moving it goes away. -She denies any chest pain, etc. -Currently not taking anything for reflux.  -Recommended patient to try Pepcid OTC at bedtime to see if this helps.  If after a few days problem still persists recommended she make appointment with Dr. Dennard Schaumann.  Update 05/12/21 Not having as many problems with reflux lately, has not had to use Pepcid. Has been taking Miralax and this has reportedly helped with her symptoms.  Does mention occasional burping in the morning that seems to make her feel better. Again, can take pepcid prn hs if she needs it. Continue to monitor.   Patient Goals/Self-Care Activities Patient will:  - take medications as prescribed focus on medication adherence by pill box check blood pressure a few times per week, document, and provide at future appointments  Follow Up Plan: The care management team will reach out to the patient again over the next 120 days.          Patient verbalizes understanding of instructions provided today and agrees to view in [place].  Telephone follow up appointment with pharmacy team member scheduled for: 4 months  Edythe Clarity, [place], PharmD, [place] Clinical Pharmacist Practitioner [place] [phone]

## 2021-05-14 DIAGNOSIS — I159 Secondary hypertension, unspecified: Secondary | ICD-10-CM

## 2021-05-14 DIAGNOSIS — E7800 Pure hypercholesterolemia, unspecified: Secondary | ICD-10-CM

## 2021-05-17 ENCOUNTER — Other Ambulatory Visit: Payer: Self-pay

## 2021-05-17 MED ORDER — ACCU-CHEK AVIVA PLUS VI STRP: ORAL_STRIP | 3 refills | Status: DC

## 2021-05-17 MED ORDER — BD SWAB SINGLE USE REGULAR PADS: MEDICATED_PAD | 5 refills | Status: AC

## 2021-05-17 MED ORDER — ACCU-CHEK SOFTCLIX LANCETS MISC
3 refills | Status: AC
Start: 2021-05-17 — End: ?

## 2021-05-17 MED ORDER — ACCU-CHEK GUIDE CONTROL VI LIQD: 0 refills | Status: AC

## 2021-05-17 MED ORDER — ACCU-CHEK GUIDE W/DEVICE KIT: PACK | 0 refills | Status: AC

## 2021-05-19 ENCOUNTER — Other Ambulatory Visit (HOSPITAL_COMMUNITY): Payer: Self-pay | Admitting: Family Medicine

## 2021-05-19 ENCOUNTER — Ambulatory Visit (HOSPITAL_COMMUNITY)
Admission: RE | Admit: 2021-05-19 | Discharge: 2021-05-19 | Disposition: A | Discharge status: Home / Self Care | Payer: Medicare HMO | Source: Ambulatory Visit | Attending: Family Medicine | Admitting: Family Medicine

## 2021-05-19 ENCOUNTER — Other Ambulatory Visit: Payer: Self-pay

## 2021-05-19 DIAGNOSIS — R928 Other abnormal and inconclusive findings on diagnostic imaging of breast: Secondary | ICD-10-CM | POA: Diagnosis not present

## 2021-05-19 DIAGNOSIS — R922 Inconclusive mammogram: Secondary | ICD-10-CM | POA: Diagnosis not present

## 2021-05-31 ENCOUNTER — Ambulatory Visit (HOSPITAL_COMMUNITY)
Admission: RE | Admit: 2021-05-31 | Discharge: 2021-05-31 | Disposition: A | Discharge status: Home / Self Care | Payer: Medicare HMO | Source: Ambulatory Visit | Attending: Family Medicine | Admitting: Family Medicine

## 2021-05-31 ENCOUNTER — Other Ambulatory Visit: Payer: Self-pay

## 2021-05-31 ENCOUNTER — Other Ambulatory Visit (HOSPITAL_COMMUNITY): Payer: Self-pay | Admitting: Family Medicine

## 2021-05-31 DIAGNOSIS — R928 Other abnormal and inconclusive findings on diagnostic imaging of breast: Secondary | ICD-10-CM

## 2021-05-31 DIAGNOSIS — N641 Fat necrosis of breast: Secondary | ICD-10-CM | POA: Diagnosis not present

## 2021-05-31 DIAGNOSIS — N6311 Unspecified lump in the right breast, upper outer quadrant: Secondary | ICD-10-CM | POA: Diagnosis not present

## 2021-06-01 LAB — SURGICAL PATHOLOGY

## 2021-06-07 ENCOUNTER — Other Ambulatory Visit (HOSPITAL_COMMUNITY): Payer: Medicare HMO

## 2021-06-07 ENCOUNTER — Encounter (HOSPITAL_COMMUNITY): Payer: Medicare HMO

## 2021-06-09 ENCOUNTER — Other Ambulatory Visit: Payer: Self-pay

## 2021-06-09 ENCOUNTER — Ambulatory Visit (INDEPENDENT_AMBULATORY_CARE_PROVIDER_SITE_OTHER): Payer: Medicare HMO

## 2021-06-09 VITALS — Ht 64.0 in | Wt 245.0 lb

## 2021-06-09 DIAGNOSIS — Z0000 Encounter for general adult medical examination without abnormal findings: Secondary | ICD-10-CM

## 2021-06-09 NOTE — Progress Notes (Signed)
Subjective:   ADEA [name] is a 81 y.o. female who presents for Medicare Annual (Subsequent) preventive examination.  Review of Systems     Cardiac Risk Factors include: advanced age (>76mn, >>42women);diabetes mellitus;hypertension;dyslipidemia;sedentary lifestyle;obesity (BMI >30kg/m2)  PHONE VISIT. PT AT HOME NURSE AT [facility].    Objective:    Today's Vitals   06/09/21 0902  Weight: 245 lb (111.1 kg)  Height: '5\' 4"'  (1.626 m)   Body mass index is 42.05 kg/m.  Advanced Directives 06/09/2021 10/18/2020 12/06/2017  Does Patient Have a Medical Advance Directive? No No No  Would patient like information on creating a medical advance directive? No - Patient declined No - Patient declined Yes (MAU/Ambulatory/Procedural Areas - Information given)    Current Medications (verified) Outpatient Encounter Medications as of 06/09/2021  Medication Sig   Accu-Chek Softclix Lancets lancets Use as instructed   Alcohol Swabs (B-D SINGLE USE SWABS REGULAR) PADS As needed   amLODipine (NORVASC) 10 MG tablet TAKE 1 TABLET EVERY DAY   Ascorbic Acid (VITAMIN C) 1000 MG tablet Take 1,000 mg by mouth daily.   aspirin 81 MG chewable tablet Chew 81 mg by mouth daily.   atorvastatin (LIPITOR) 40 MG tablet Take 1 tablet (40 mg total) by mouth daily.   Blood Glucose Calibration (ACCU-CHEK GUIDE CONTROL) LIQD As directed   Blood Glucose Monitoring Suppl (ACCU-CHEK AVIVA PLUS) w/Device KIT USE AS DIRECTED TO CHECK BLOOD SUGAR   Blood Glucose Monitoring Suppl (ACCU-CHEK GUIDE) w/Device KIT As directed   Cholecalciferol (VITAMIN D) 2000 UNITS tablet Take 1,000 Units by mouth daily.    Coenzyme Q10 (CO Q-10) 100 MG CAPS Take by mouth.   doxazosin (CARDURA) 1 MG tablet TAKE 1 TABLET (1 MG TOTAL) BY MOUTH DAILY.   famotidine (PEPCID) 20 MG tablet Take 20 mg by mouth 2 (two) times daily.   fenofibrate 54 MG tablet TAKE 1 TABLET EVERY DAY   glucose blood (ACCU-CHEK AVIVA PLUS) test strip Use as instructed    Lancet Devices (ACCU-CHEK SOFTCLIX) lancets Check BS qd - DX- E11.9   losartan-hydrochlorothiazide (HYZAAR) 100-25 MG tablet TAKE 1 TABLET EVERY DAY   metoprolol succinate (TOPROL-XL) 50 MG 24 hr tablet TAKE 1 TABLET EVERY DAY WITH OR IMMEDIATELY FOLLOWING A MEAL.   naproxen sodium (ALEVE) 220 MG tablet Take 220 mg by mouth daily as needed (knee pain).   Omega-3 Fatty Acids (FISH OIL CONCENTRATE PO) Take by mouth.   Polyethyl Glycol-Propyl Glycol 0.4-0.3 % SOLN Apply to eye.   polyethylene glycol (MIRALAX / GLYCOLAX) packet Take 17 g by mouth daily.   pyridOXINE (VITAMIN B-6) 100 MG tablet Take 100 mg by mouth daily as needed.   No facility-administered encounter medications on file as of 06/09/2021.    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    [facility]  Past Surgical History:  Procedure Laterality Date   lt eye implant  1998   rt knee surg  1991   TUBAL LIGATION  1981   Family History  Problem Relation Age of Onset   Stroke Paternal Grandmother    Diabetes Mother    Diabetes Sister    Diabetes Grandchild    Social History   Socioeconomic History   Marital status: Widowed    Spouse name: Not on file   Number of children: Not on file   Years of education: Not on file   Highest education level: Not on file  Occupational History   Not on file  Tobacco Use   Smoking status: Former    Types: Cigarettes    Quit date: 09/07/1973    Years since quitting: 47.7    Smokeless tobacco: Never  Vaping Use   Vaping Use: Never used  Substance and Sexual Activity   Alcohol use: No   Drug use: No   Sexual activity: Not Currently  Other Topics Concern   Not on file  Social History Narrative   Not on file   Social Determinants of Health   Financial Resource Strain: Low Risk    Difficulty of Paying Living Expenses: Not hard at all  Food Insecurity: No Food Insecurity   Worried About Charity fundraiser in the Last Year: Never true   [place] in the Last Year: Never true  Transportation Needs: No Transportation Needs   Lack of Transportation (Medical): No   Lack of Transportation (Non-Medical): No  Physical Activity: Insufficiently Active   Days of Exercise per Week: 3 days   Minutes of Exercise per Session: 30 min  Stress: No Stress Concern Present   Feeling of Stress : Not at all  Social Connections: Moderately Integrated   Frequency of Communication with Friends and Family: More than three times a week   Frequency of Social Gatherings with Friends and Family: Twice a week   Attends Religious Services: 1 to 4 times per year   Active Member of [employer] or Organizations: Yes   Attends Archivist Meetings: 1 to 4 times per year   Marital Status: Widowed    Tobacco Counseling Counseling given: Not Answered   Clinical Intake:  Pre-visit preparation completed: Yes  Pain : No/denies pain     BMI - recorded: 42.05 Nutritional Status: BMI > 30  Obese Nutritional Risks: None Diabetes: Yes  How often do you need to have someone help you when you read instructions, pamphlets, or other written materials from your doctor or pharmacy?: 1 - Never  Diabetic? Nutrition Risk Assessment:  Has the patient had any N/V/D within the last 2 months?  No  Does the patient have any non-healing wounds?  No  Has the patient had any unintentional weight loss or weight gain?  No   Diabetes:  Is the patient diabetic?  Yes  If diabetic, was  a CBG obtained today?  No  Did the patient bring in their glucometer from home?  No  PHONE VISIT. How often do you monitor your CBG's? DAILY.   Financial Strains and Diabetes Management:  Are you having any financial strains with the device, your supplies or your medication? No .  Does the patient want to be seen by Chronic Care Management for management of their diabetes?  No  Would the patient like to be referred to a Nutritionist or for Diabetic Management?  No   Diabetic Exams:  Diabetic Eye Exam: Completed  2022. Pt has been advised about the importance in completing this exam.  Diabetic Foot Exam: Completed OVERDUE. Pt has been advised about the importance in completing this exam.   Interpreter Needed?: No  Information entered by :: mj Izaah Westman,lpn   Activities of Daily Living In your present state of health, do you have any difficulty performing the following activities: 06/09/2021  Hearing? N  Vision? N  Difficulty concentrating or making decisions? Y  Comment memory at times.  Walking or climbing stairs? N  Dressing or bathing? N  Doing errands, shopping? N  Preparing Food and eating ? N  Using the Toilet? N  In the past six months, have you accidently leaked urine? N  Do you have problems with loss of bowel control? N  Managing your Medications? N  Managing your Finances? N  Housekeeping or managing your Housekeeping? N  Some recent data might be hidden    Patient Care Team: Susy Frizzle, MD as PCP - General (Family Medicine) Jerline Pain, MD as PCP - Cardiology (Cardiology) Edythe Clarity, [facility] as Pharmacist (Pharmacist)  Indicate any recent Medical Services you may have received from other than [facility] providers in the past year (date may be approximate).     Assessment:   This is a routine wellness examination for [name].  Hearing/Vision screen Hearing Screening - Comments:: No hearing issues.  Vision Screening - Comments:: Glasses. Dr.  Newman Nip in [place]. 2022.  Dietary issues and exercise activities discussed: Current Exercise Habits: Home exercise routine, Type of exercise: walking;Other - see comments (Exercise bike-stationary.), Time (Minutes): 30, Frequency (Times/Week): 3, Weekly Exercise (Minutes/Week): 90, Intensity: Mild, Exercise limited by: cardiac condition(s);orthopedic condition(s)   Goals Addressed             This Visit's Progress    Exercise 3x per week (30 min per time)       Try to increase exercise.        Depression Screen PHQ 2/9 Scores 06/09/2021 07/08/2019 12/06/2017 06/14/2017 02/11/2016 08/07/2013  PHQ - 2 Score 0 0 0 0 0 0  PHQ- 9 Score - - 0 - - -    Fall Risk Fall Risk  06/09/2021 06/08/2020 07/08/2019 12/06/2017 06/14/2017  Falls in the past year? 1 0 0 No No  Number falls in past yr: 1 0 - - -  Injury with Fall? 0 0 - - -  Risk for fall due to : History of fall(s);Impaired balance/gait - - - -  Follow up Falls prevention discussed Falls evaluation completed Falls evaluation completed - -    FALL RISK PREVENTION PERTAINING TO THE HOME:  Any stairs in or around the home? No  If so, are there any without handrails? No  Home free of loose throw rugs in walkways, pet beds, electrical cords, etc? Yes  Adequate lighting in your home to reduce risk of falls? Yes   ASSISTIVE DEVICES UTILIZED TO PREVENT FALLS:  Life alert? No  Use of a cane, walker or w/c? Yes  Grab bars in the bathroom? Yes  Shower chair or bench in shower? Yes  Elevated toilet seat or a handicapped toilet? Yes   TIMED UP AND GO:  Was the test performed? No .  Phone visit.  Cognitive Function: MMSE - Mini Mental State Exam 12/06/2017  Orientation to time 5  Orientation to Place 5  Registration 3  Attention/ Calculation 0  Recall 3  Language- name 2 objects 2  Language- repeat 1  Language- follow 3  step command 3  Language- read & follow direction 1  Write a sentence 1  Copy design 0  Total score 24      6CIT Screen 06/09/2021  What Year? 0 points  What month? 0 points  What time? 0 points  Count back from 20 0 points  Months in reverse 4 points  Repeat phrase 2 points  Total Score 6    Immunizations Immunization History  Administered Date(s) Administered   Fluad Quad(high Dose 65+) 01/31/2019, 01/28/2020, 01/27/2021   Influenza Whole 01/17/2010, 02/07/2012   Influenza, High Dose Seasonal PF 02/13/2017   Influenza,inj,Quad PF,6+ Mos 02/04/2013, 02/11/2014, 02/16/2015, 01/18/2016, 01/25/2018   PFIZER(Purple Top)SARS-COV-2 Vaccination 07/12/2019, 08/09/2019, 03/05/2020   Pneumococcal Conjugate-13 05/28/2014   Pneumococcal Polysaccharide-23 04/25/2005, 02/11/2013   Td 10/14/1998   Tdap 12/06/2017    TDAP status: Up to date  Flu Vaccine status: Up to date  Pneumococcal vaccine status: Up to date  Covid-19 vaccine status: Completed vaccines  Qualifies for Shingles Vaccine? Yes   Zostavax completed No   Shingrix Completed?: No.    Education has been provided regarding the importance of this vaccine. Patient has been advised to call insurance company to determine out of pocket expense if they have not yet received this vaccine. Advised may also receive vaccine at local pharmacy or Health Dept. Verbalized acceptance and understanding.  Screening Tests Health Maintenance  Topic Date Due   Hepatitis C Screening  Never done   Zoster Vaccines- Shingrix (1 of 2) Never done   DEXA SCAN  Never done   COVID-19 Vaccine (4 - Booster for Pfizer series) 04/30/2020   OPHTHALMOLOGY EXAM  07/26/2020   TETANUS/TDAP  12/07/2027   Pneumonia Vaccine 62+ Years old  Completed   INFLUENZA VACCINE  Completed   HPV VACCINES  Aged Out    Health Maintenance  Health Maintenance Due  Topic Date Due   Hepatitis C Screening  Never done   Zoster Vaccines- Shingrix (1 of 2) Never done   DEXA SCAN  Never done   COVID-19 Vaccine (4 - Booster for Pfizer series) 04/30/2020   OPHTHALMOLOGY EXAM   07/26/2020    Colorectal cancer screening: No longer required.   Mammogram status: Completed 05/19/2021. Repeat every year  Bone Density status: Ordered Pt declined at this time.Marland Kitchen Pt provided with contact info and advised to call to schedule appt.  Lung Cancer Screening: (Low Dose CT Chest recommended if Age 63-80 years, 30 pack-year currently smoking OR have quit w/in 15years.) does not qualify.   Additional Screening:  Hepatitis C Screening: does not qualify.  Vision Screening: Recommended annual ophthalmology exams for early detection of glaucoma and other disorders of the eye. Is the patient up to date with their annual eye exam?  Yes  Who is the provider or what is the name of the office in which the patient attends annual eye exams? Dr. Newman Nip in [place] If pt is not established with a provider, would they like to be referred to a provider to establish care? No .   Dental Screening: Recommended annual dental exams for proper oral hygiene  Community Resource Referral / Chronic Care Management: CRR required this visit?  No   CCM required this visit?  No      Plan:     I have personally reviewed and noted the following in the patients chart:   Medical and social history Use of alcohol, tobacco or illicit drugs  Current medications and supplements including opioid prescriptions.  Functional ability  and status Nutritional status Physical activity Advanced directives List of other physicians Hospitalizations, surgeries, and ER visits in previous 12 months Vitals Screenings to include cognitive, depression, and falls Referrals and appointments  In addition, I have reviewed and discussed with patient certain preventive protocols, quality metrics, and best practice recommendations. A written personalized care plan for preventive services as well as general preventive health recommendations were provided to patient.     Chriss Driver, LPN   [id number]    Nurse Notes: Discussed bone density and pt declines at this time. Pt is up to date on all other health maintenance and vaccines.

## 2021-06-09 NOTE — Patient Instructions (Addendum)
[name] , Thank you for taking time to come for your Medicare Wellness Visit. I appreciate your ongoing commitment to your health goals. Please review the following plan we discussed and let me know if I can assist you in the future.   Screening recommendations/referrals: Colonoscopy: Done 01/29/2014. No longer required due to age. Mammogram: Done 05/19/2021 Repeat annually  Bone Density: Declined.   Recommended yearly ophthalmology/optometry visit for glaucoma screening and checkup Recommended yearly dental visit for hygiene and checkup  Vaccinations: Influenza vaccine: Done 01/27/2021 Repeat annually  Pneumococcal vaccine: Done 04/25/2005, 02/11/2013 and 05/28/2014 Tdap vaccine: Done 12/06/2017 Repeat in 10 years  Shingles vaccine: Shingrix discussed. Please contact your pharmacy for coverage information.     Covid-19:Done 07/12/2019, 08/09/2019 and 03/05/2020  Advanced directives: Advance directive discussed with you today. Even though you declined this today, please call our office should you change your mind, and we can give you the proper paperwork for you to fill out.   Conditions/risks identified: Aim for 30 minutes of exercise or brisk walking each day, drink 6-8 glasses of water and eat lots of fruits and vegetables.   Next appointment: Follow up in one year for your annual wellness visit 2024.   Preventive Care 66 Years and Older, Female Preventive care refers to lifestyle choices and visits with your health care provider that can promote health and wellness. What does preventive care include? A yearly physical exam. This is also called an annual well check. Dental exams once or twice a year. Routine eye exams. Ask your health care provider how often you should have your eyes checked. Personal lifestyle choices, including: Daily care of your teeth and gums. Regular physical activity. Eating a healthy diet. Avoiding tobacco and drug use. Limiting alcohol use. Practicing  safe sex. Taking low-dose aspirin every day. Taking vitamin and mineral supplements as recommended by your health care provider. What happens during an annual well check? The services and screenings done by your health care provider during your annual well check will depend on your age, overall health, lifestyle risk factors, and family history of disease. Counseling  Your health care provider may ask you questions about your: Alcohol use. Tobacco use. Drug use. Emotional well-being. Home and relationship well-being. Sexual activity. Eating habits. History of falls. Memory and ability to understand (cognition). Work and work Statistician. Reproductive health. Screening  You may have the following tests or measurements: Height, weight, and BMI. Blood pressure. Lipid and cholesterol levels. These may be checked every 5 years, or more frequently if you are over 79 years old. Skin check. Lung cancer screening. You may have this screening every year starting at age 63 if you have a 30-pack-year history of smoking and currently smoke or have quit within the past 15 years. Fecal occult blood test (FOBT) of the stool. You may have this test every year starting at age 23. Flexible sigmoidoscopy or colonoscopy. You may have a sigmoidoscopy every 5 years or a colonoscopy every 10 years starting at age 71. Hepatitis C blood test. Hepatitis B blood test. Sexually transmitted disease (STD) testing. Diabetes screening. This is done by checking your blood sugar (glucose) after you have not eaten for a while (fasting). You may have this done every 1-3 years. Bone density scan. This is done to screen for osteoporosis. You may have this done starting at age 81. Mammogram. This may be done every 1-2 years. Talk to your health care provider about how often you should have regular mammograms. Talk with  your health care provider about your test results, treatment options, and if necessary, the need for more  tests. Vaccines  Your health care provider may recommend certain vaccines, such as: Influenza vaccine. This is recommended every year. Tetanus, diphtheria, and acellular pertussis (Tdap, Td) vaccine. You may need a Td booster every 10 years. Zoster vaccine. You may need this after age 82. Pneumococcal 13-valent conjugate (PCV13) vaccine. One dose is recommended after age 40. Pneumococcal polysaccharide (PPSV23) vaccine. One dose is recommended after age 5. Talk to your health care provider about which screenings and vaccines you need and how often you need them. This information is not intended to replace advice given to you by your health care provider. Make sure you discuss any questions you have with your health care provider. Document Released: 05/28/2015 Document Revised: 01/19/2016 Document Reviewed: 03/02/2015 Elsevier Interactive Patient Education  2017 [place] Prevention in the Home Falls can cause injuries. They can happen to people of all ages. There are many things you can do to make your home safe and to help prevent falls. What can I do on the outside of my home? Regularly fix the edges of walkways and driveways and fix any cracks. Remove anything that might make you trip as you walk through a door, such as a raised step or threshold. Trim any bushes or trees on the path to your home. Use bright outdoor lighting. Clear any walking paths of anything that might make someone trip, such as rocks or tools. Regularly check to see if handrails are loose or broken. Make sure that both sides of any steps have handrails. Any raised decks and porches should have guardrails on the edges. Have any leaves, snow, or ice cleared regularly. Use sand or salt on walking paths during winter. Clean up any spills in your garage right away. This includes oil or grease spills. What can I do in the bathroom? Use night lights. Install grab bars by the toilet and in the tub and shower.  Do not use towel bars as grab bars. Use non-skid mats or decals in the tub or shower. If you need to sit down in the shower, use a plastic, non-slip stool. Keep the floor dry. Clean up any water that spills on the floor as soon as it happens. Remove soap buildup in the tub or shower regularly. Attach bath mats securely with double-sided non-slip rug tape. Do not have throw rugs and other things on the floor that can make you trip. What can I do in the bedroom? Use night lights. Make sure that you have a light by your bed that is easy to reach. Do not use any sheets or blankets that are too big for your bed. They should not hang down onto the floor. Have a firm chair that has side arms. You can use this for support while you get dressed. Do not have throw rugs and other things on the floor that can make you trip. What can I do in the kitchen? Clean up any spills right away. Avoid walking on wet floors. Keep items that you use a lot in easy-to-reach places. If you need to reach something above you, use a strong step stool that has a grab bar. Keep electrical cords out of the way. Do not use floor polish or wax that makes floors slippery. If you must use wax, use non-skid floor wax. Do not have throw rugs and other things on the floor that can make you trip.  What can I do with my stairs? Do not leave any items on the stairs. Make sure that there are handrails on both sides of the stairs and use them. Fix handrails that are broken or loose. Make sure that handrails are as long as the stairways. Check any carpeting to make sure that it is firmly attached to the stairs. Fix any carpet that is loose or worn. Avoid having throw rugs at the top or bottom of the stairs. If you do have throw rugs, attach them to the floor with carpet tape. Make sure that you have a light switch at the top of the stairs and the bottom of the stairs. If you do not have them, ask someone to add them for you. What else  can I do to help prevent falls? Wear shoes that: Do not have high heels. Have rubber bottoms. Are comfortable and fit you well. Are closed at the toe. Do not wear sandals. If you use a stepladder: Make sure that it is fully opened. Do not climb a closed stepladder. Make sure that both sides of the stepladder are locked into place. Ask someone to hold it for you, if possible. Clearly mark and make sure that you can see: Any grab bars or handrails. First and last steps. Where the edge of each step is. Use tools that help you move around (mobility aids) if they are needed. These include: Canes. Walkers. Scooters. Crutches. Turn on the lights when you go into a dark area. Replace any light bulbs as soon as they burn out. Set up your furniture so you have a clear path. Avoid moving your furniture around. If any of your floors are uneven, fix them. If there are any pets around you, be aware of where they are. Review your medicines with your doctor. Some medicines can make you feel dizzy. This can increase your chance of falling. Ask your doctor what other things that you can do to help prevent falls. This information is not intended to replace advice given to you by your health care provider. Make sure you discuss any questions you have with your health care provider. Document Released: 02/25/2009 Document Revised: 10/07/2015 Document Reviewed: 06/05/2014 Elsevier Interactive Patient Education  2017 [employer].

## 2021-06-13 ENCOUNTER — Other Ambulatory Visit: Payer: Self-pay

## 2021-06-13 ENCOUNTER — Telehealth: Payer: Self-pay | Admitting: Family Medicine

## 2021-06-13 ENCOUNTER — Ambulatory Visit (INDEPENDENT_AMBULATORY_CARE_PROVIDER_SITE_OTHER): Payer: Medicare HMO

## 2021-06-13 DIAGNOSIS — Z7189 Other specified counseling: Secondary | ICD-10-CM

## 2021-06-13 NOTE — Telephone Encounter (Signed)
Erroneous encounter. Please disregard.

## 2021-06-13 NOTE — Progress Notes (Signed)
Patient came to office today confused how to use new glucometer.  I worked with patient and glucometer.  Patient voiced understanding.  Patient also asked about bandage on breast biopsy site.  Removed bandage.

## 2021-06-16 ENCOUNTER — Telehealth: Payer: Self-pay | Admitting: Pharmacist

## 2021-06-16 NOTE — Progress Notes (Signed)
Chronic Care Management Pharmacy Assistant   Name: [name]  MRN: [id number] DOB: August 08, 1940   Reason for Encounter: Disease State - General Adherence Call     Recent office visits:  06/09/21 Annual Medicare Wellness Completed.  Recent consult visits:  None noted.   Hospital visits:  None in previous 6 months  Medications: Outpatient Encounter Medications as of 06/16/2021  Medication Sig   Accu-Chek Softclix Lancets lancets Use as instructed   Alcohol Swabs (B-D SINGLE USE SWABS REGULAR) PADS As needed   amLODipine (NORVASC) 10 MG tablet TAKE 1 TABLET EVERY DAY   Ascorbic Acid (VITAMIN C) 1000 MG tablet Take 1,000 mg by mouth daily.   aspirin 81 MG chewable tablet Chew 81 mg by mouth daily.   atorvastatin (LIPITOR) 40 MG tablet Take 1 tablet (40 mg total) by mouth daily.   Blood Glucose Calibration (ACCU-CHEK GUIDE CONTROL) LIQD As directed   Blood Glucose Monitoring Suppl (ACCU-CHEK AVIVA PLUS) w/Device KIT USE AS DIRECTED TO CHECK BLOOD SUGAR   Blood Glucose Monitoring Suppl (ACCU-CHEK GUIDE) w/Device KIT As directed   Cholecalciferol (VITAMIN D) 2000 UNITS tablet Take 1,000 Units by mouth daily.    Coenzyme Q10 (CO Q-10) 100 MG CAPS Take by mouth.   doxazosin (CARDURA) 1 MG tablet TAKE 1 TABLET (1 MG TOTAL) BY MOUTH DAILY.   famotidine (PEPCID) 20 MG tablet Take 20 mg by mouth 2 (two) times daily.   fenofibrate 54 MG tablet TAKE 1 TABLET EVERY DAY   glucose blood (ACCU-CHEK AVIVA PLUS) test strip Use as instructed   Lancet Devices (ACCU-CHEK SOFTCLIX) lancets Check BS qd - DX- E11.9   losartan-hydrochlorothiazide (HYZAAR) 100-25 MG tablet TAKE 1 TABLET EVERY DAY   metoprolol succinate (TOPROL-XL) 50 MG 24 hr tablet TAKE 1 TABLET EVERY DAY WITH OR IMMEDIATELY FOLLOWING A MEAL.   naproxen sodium (ALEVE) 220 MG tablet Take 220 mg by mouth daily as needed (knee pain).   Omega-3 Fatty Acids (FISH OIL CONCENTRATE PO) Take by mouth.   Polyethyl Glycol-Propyl Glycol  0.4-0.3 % SOLN Apply to eye.   polyethylene glycol (MIRALAX / GLYCOLAX) packet Take 17 g by mouth daily.   pyridOXINE (VITAMIN B-6) 100 MG tablet Take 100 mg by mouth daily as needed.   No facility-administered encounter medications on file as of 06/16/2021.    Have you had any problems recently with your health? Patient denied having any recent problems with her health. She is taking Atorvastatin and has been taken off Fenofibrate. She denied any issues with it. She is checking her blood pressures daily and yesterday it was 121/72.  Have you had any problems with your pharmacy? Patient denied any issues with her current pharmacy.   What issues or side effects are you having with your medications? Patient denied any issues or side effects with her current medications.   What would you like me to pass along to [name], CPP for them to help you with?  Patient states she is doing well and feeling good.   What can we do to take care of you better? Patient did not have any recommendations at this time.   Care Gaps   AWV: done 05/09/21 Colonoscopy: done 01/08/14 DM Eye Exam: overdue 07/26/20 DM Foot Exam: N/A Microalbumin: N/A HbgAIC: done 01/27/21 (5.7) DEXA: ordered Mammogram: done 05/19/21   Star Rating Drugs: losartan-hydrochlorothiazide (HYZAAR) 100-25 MG tablet - last filled 05/03/21 90 days  atorvastatin (LIPITOR) 40 MG tablet - last filled 05/12/21 90 days  Future Appointments  Date Time Provider [place]  08/25/2021 12:30 PM [facility] PHARMACIST [facility] None    Jobe Gibbon, [facility] Clinical Pharmacist Assistant  [phone]

## 2021-07-04 ENCOUNTER — Ambulatory Visit: Payer: Medicare HMO | Admitting: Family Medicine

## 2021-07-04 ENCOUNTER — Other Ambulatory Visit: Payer: Self-pay

## 2021-07-04 ENCOUNTER — Ambulatory Visit (INDEPENDENT_AMBULATORY_CARE_PROVIDER_SITE_OTHER): Payer: Medicare HMO | Admitting: Family Medicine

## 2021-07-04 VITALS — BP 148/88 | HR 72 | Temp 97.7°F | Resp 18 | Ht 64.0 in | Wt 238.0 lb

## 2021-07-04 DIAGNOSIS — M1711 Unilateral primary osteoarthritis, right knee: Secondary | ICD-10-CM

## 2021-07-04 DIAGNOSIS — M069 Rheumatoid arthritis, unspecified: Secondary | ICD-10-CM | POA: Diagnosis not present

## 2021-07-04 DIAGNOSIS — M79622 Pain in left upper arm: Secondary | ICD-10-CM | POA: Diagnosis not present

## 2021-07-04 MED ORDER — CELECOXIB 200 MG PO CAPS: 200.0000 mg | ORAL_CAPSULE | Freq: Two times a day (BID) | ORAL | 1 refills | Status: DC

## 2021-07-04 NOTE — Progress Notes (Signed)
Subjective:    Patient ID: [name], female    DOB: 1941-04-07, 81 y.o.   MRN: [id number] Patient is a very sweet 81 year old African-American female who presents today complaining of pain in her left bicep as well as pain in her right knee.  The pain in the left bicep is in the direct center of the arm.  She states that it hurts to bend her elbow.  She is slightly tender to palpation in the bicep.  There is no tenderness with range of motion in the left shoulder.  There is no tenderness with range of motion of the left elbow.  Palpation does not elicit pain.  There is no palpable mass.  There is no visible bruising or erythema or deformity.  She also complains of pain in her right knee.  The pain is with standing and walking.  The pain is located over the medial and lateral joint line.  She is having to use a cane to ambulate.  A cortisone shot that I gave her in the past helped the pain.  She would like a handicap placard because it is difficult for her to walk in and out of grocery stores, etc.  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     aspirin 81 MG chewable tablet Chew 81 mg by mouth daily.     atorvastatin (LIPITOR) 40 MG tablet Take 1 tablet (40 mg total) by mouth daily. 90 tablet 3   Blood Glucose Calibration (ACCU-CHEK GUIDE CONTROL) LIQD As directed 1 each 0   Blood Glucose Monitoring Suppl (ACCU-CHEK AVIVA PLUS) w/Device KIT USE AS DIRECTED TO CHECK BLOOD SUGAR 1 kit 0   Blood Glucose Monitoring Suppl (ACCU-CHEK GUIDE) w/Device KIT As directed 1 kit 0   Cholecalciferol (VITAMIN D) 2000 UNITS tablet Take 1,000 Units by mouth daily.      Coenzyme Q10 (CO Q-10) 100 MG CAPS Take by mouth.     doxazosin (CARDURA) 1 MG tablet TAKE 1 TABLET (1 MG TOTAL) BY MOUTH DAILY. 90 tablet 3   famotidine (PEPCID) 20 MG tablet Take 20 mg by mouth 2 (two) times daily.     fenofibrate 54 MG tablet TAKE 1 TABLET EVERY DAY 90 tablet 3   glucose blood (ACCU-CHEK AVIVA PLUS)  test strip Use as instructed 100 strip 3   Lancet Devices (ACCU-CHEK SOFTCLIX) lancets Check BS qd - DX- E11.9 1 each 5   losartan-hydrochlorothiazide (HYZAAR) 100-25 MG tablet TAKE 1 TABLET EVERY DAY 90 tablet 1   metoprolol succinate (TOPROL-XL) 50 MG 24 hr tablet TAKE 1 TABLET EVERY DAY WITH OR IMMEDIATELY FOLLOWING A MEAL. 90 tablet 3   naproxen sodium (ALEVE) 220 MG tablet Take 220 mg by mouth daily as needed (knee pain).     Omega-3 Fatty Acids (FISH OIL CONCENTRATE PO) Take by mouth.     Polyethyl Glycol-Propyl Glycol 0.4-0.3 % SOLN Apply to eye.     polyethylene glycol (MIRALAX / GLYCOLAX) packet Take 17 g by mouth daily.     pyridOXINE (VITAMIN B-6) 100 MG tablet Take 100 mg by mouth daily as needed.     No current facility-administered medications on file prior to visit.   Allergies  Allergen Reactions   Vytorin [Ezetimibe-Simvastatin] Other (See Comments)    myalgia   Social History   Socioeconomic History   Marital status: Widowed    Spouse  name: Not on file   Number of children: Not on file   Years of education: Not on file   Highest education level: Not on file  Occupational History   Not on file  Tobacco Use   Smoking status: Former    Types: Cigarettes    Quit date: 09/07/1973    Years since quitting: 47.8   Smokeless tobacco: Never  Vaping Use   Vaping Use: Never used  Substance and Sexual Activity   Alcohol use: No   Drug use: No   Sexual activity: Not Currently  Other Topics Concern   Not on file  Social History Narrative   Not on file   Social Determinants of Health   Financial Resource Strain: Low Risk    Difficulty of Paying Living Expenses: Not hard at all  Food Insecurity: No Food Insecurity   Worried About Charity fundraiser in the Last Year: Never true   [place] in the Last Year: Never true  Transportation Needs: No Transportation Needs   Lack of Transportation (Medical): No   Lack of Transportation (Non-Medical): No  Physical Activity: Insufficiently Active   Days of Exercise per Week: 3 days   Minutes of Exercise per Session: 30 min  Stress: No Stress Concern Present   Feeling of Stress : Not at all  Social Connections: Moderately Integrated   Frequency of Communication with Friends and Family: More than three times a week   Frequency of Social Gatherings with Friends and Family: Twice a week   Attends Religious Services: 1 to 4 times per year   Active Member of [employer] or Organizations: Yes   Attends Archivist Meetings: 1 to 4 times per year   Marital Status: Widowed  Human resources officer Violence: Not At Risk   Fear of Current or Ex-Partner: No   Emotionally Abused: No   Physically Abused: No   Sexually Abused: No    Family History  Problem Relation Age of Onset   Stroke Paternal Grandmother    Diabetes Mother    Diabetes Sister    Diabetes Grandchild      Review of Systems  All other systems reviewed and are negative.     Objective:   Physical Exam Vitals  reviewed.  Constitutional:      General: She is not in acute distress.    Appearance: Normal appearance.  She is well-developed. She is obese. She is not ill-appearing, toxic-appearing or diaphoretic.  HENT:     Head: Normocephalic and atraumatic.  Neck:     Thyroid: No thyromegaly.     Vascular: No JVD.  Cardiovascular:     Rate and Rhythm: Normal rate and regular rhythm.     Pulses: Normal pulses.     Heart sounds: Murmur heard.    No friction rub. No gallop.  Pulmonary:     Effort: Pulmonary effort is normal. No respiratory distress.     Breath sounds: Normal breath sounds. No stridor. No wheezing, rhonchi or rales.  Chest:     Chest wall: No tenderness.  Abdominal:     General: Bowel sounds are decreased.  Musculoskeletal:        General: Tenderness present. No swelling or deformity.     Left upper arm: No swelling, edema, deformity, tenderness or bony tenderness.       Arms:     Right knee: Decreased range of motion. Tenderness present over the medial joint line and lateral joint line.     Right lower leg: No edema.     Left lower leg: No edema.  Neurological:     Mental Status: She is alert.          Assessment & Plan:  Osteoarthritis of right knee, unspecified osteoarthritis type  Left upper arm pain We will try Celebrex 200 mg p.o. twice daily for osteoarthritis in the right knee and what I suspect is simply muscle tenderness in the left biceps.  If the pain is not improving in the left biceps, I will meant to proceed with an x-ray of the upper arm likely an orthopedic consultation.  Hopefully the NSAID will calm down the inflammation.  The pain in the right knee believe is secondary to arthritis.  This is a pain and she will likely have to live with a.  Hopefully the Celebrex will make it tolerable.  I will happily give her a handicap placard

## 2021-08-01 ENCOUNTER — Telehealth: Payer: Self-pay | Admitting: Pharmacist

## 2021-08-01 NOTE — Progress Notes (Signed)
? ? ?Chronic Care Management ?Pharmacy Assistant  ? ? ?Name: [name]  MRN: [id number] DOB: [birth year] ? ? ? ?Reason for Encounter: Disease State - General Adherence Call  ?  ? ? ?Recent office visits:  ?07/04/21 Taylor Luo, MD - Family Medicine - Osteoarthritis of right knee - celecoxib (CELEBREX) 200 MG capsule prescribed. Follow up as needed.  ? ?Recent consult visits:  ?None noted.  ? ?Hospital visits:  ?None in previous 6 months ? ? ?Medications: ?Outpatient Encounter Medications as of 08/01/2021  ?Medication Sig  ? Accu-Chek Softclix Lancets lancets Use as instructed  ? Alcohol Swabs (B-D SINGLE USE SWABS REGULAR) PADS As needed  ? amLODipine (NORVASC) 10 MG tablet TAKE 1 TABLET EVERY DAY  ? Ascorbic Acid (VITAMIN C) 1000 MG tablet Take 1,000 mg by mouth daily.  ? aspirin 81 MG chewable tablet Chew 81 mg by mouth daily.  ? atorvastatin (LIPITOR) 40 MG tablet Take 1 tablet (40 mg total) by mouth daily.  ? Blood Glucose Calibration (ACCU-CHEK GUIDE CONTROL) LIQD As directed  ? Blood Glucose Monitoring Suppl (ACCU-CHEK AVIVA PLUS) w/Device KIT USE AS DIRECTED TO CHECK BLOOD SUGAR  ? Blood Glucose Monitoring Suppl (ACCU-CHEK GUIDE) w/Device KIT As directed  ? celecoxib (CELEBREX) 200 MG capsule Take 1 capsule (200 mg total) by mouth 2 (two) times daily.  ? Cholecalciferol (VITAMIN D) 2000 UNITS tablet Take 1,000 Units by mouth daily.   ? Coenzyme Q10 (CO Q-10) 100 MG CAPS Take by mouth.  ? doxazosin (CARDURA) 1 MG tablet TAKE 1 TABLET (1 MG TOTAL) BY MOUTH DAILY.  ? famotidine (PEPCID) 20 MG tablet Take 20 mg by mouth 2 (two) times daily.  ? fenofibrate 54 MG tablet TAKE 1 TABLET EVERY DAY  ? glucose blood (ACCU-CHEK AVIVA PLUS) test strip Use as instructed  ? Lancet Devices (ACCU-CHEK SOFTCLIX) lancets Check BS qd - DX- E11.9  ? losartan-hydrochlorothiazide (HYZAAR) 100-25 MG tablet TAKE 1 TABLET EVERY DAY  ? metoprolol succinate (TOPROL-XL) 50 MG 24 hr tablet TAKE 1 TABLET EVERY DAY WITH OR  IMMEDIATELY FOLLOWING A MEAL.  ? naproxen sodium (ALEVE) 220 MG tablet Take 220 mg by mouth daily as needed (knee pain).  ? Omega-3 Fatty Acids (FISH OIL CONCENTRATE PO) Take by mouth.  ? Polyethyl Glycol-Propyl Glycol 0.4-0.3 % SOLN Apply to eye.  ? polyethylene glycol (MIRALAX / GLYCOLAX) packet Take 17 g by mouth daily.  ? pyridOXINE (VITAMIN B-6) 100 MG tablet Take 100 mg by mouth daily as needed.  ? ?No facility-administered encounter medications on file as of 08/01/2021.  ? ? ?Have you had any problems recently with your health? ?Patient denied any problems with her health recently. She reported "Im actually doing pretty good". ? ?Have you had any problems with your pharmacy? ?Patient denied any problems with her current pharmacy. She stated [employer] sent her a catalog where she can now order some of her vitamins though their mail order.  ? ?What issues or side effects are you having with your medications? ?Patient denied any issues or side effects with her current medications.  ? ?What would you like me to pass along to [name], CPP for them to help you with?  ?Patient did not have anything to pass along to CPP at this time.  ? ?What can we do to take care of you better? ?Patient did not have any recommendations. She appreciated the follow up call and states she hopes [name] is doing well also.  ? ? ?Care  Gaps ?  ?AWV: done 05/09/21 ?Colonoscopy: done 01/08/14 ?DM Eye Exam: overdue 07/26/20 ?DM Foot Exam: N/A ?Microalbumin: N/A ?HbgAIC: done 05/10/21 (5.8) ?DEXA: ordered ?Mammogram: done 05/19/21 ? ? ? ?Star Rating Drugs: ?losartan-hydrochlorothiazide (HYZAAR) 100-25 MG tablet - last filled 05/03/21 90 days  ?atorvastatin (LIPITOR) 40 MG tablet - last filled 05/12/21 90 days  ? ? ?No future appointments. ? ? ?[name], CCMA ?Clinical Pharmacist Assistant  ?[phone] ? ?

## 2021-08-25 ENCOUNTER — Telehealth: Payer: Medicare HMO

## 2021-10-08 ENCOUNTER — Other Ambulatory Visit: Payer: Self-pay | Admitting: Family Medicine

## 2021-10-11 NOTE — Telephone Encounter (Signed)
Requested Prescriptions  Pending Prescriptions Disp Refills  . losartan-hydrochlorothiazide (HYZAAR) 100-25 MG tablet [Pharmacy Med Name: LOSARTAN POTASSIUM/HYDROCHLOROTHIAZIDE 100-25 MG Tablet] 90 tablet 1    Sig: TAKE 1 TABLET EVERY DAY     Cardiovascular: ARB + Diuretic Combos Failed - 10/08/2021  5:27 AM      Failed - eGFR is 10 or above and within 180 days    GFR, Est African American  Date Value Ref Range Status  08/09/2020 67 > OR = 60 mL/min/1.12m Final   GFR, Est Non African American  Date Value Ref Range Status  08/09/2020 58 (L) > OR = 60 mL/min/1.730mFinal   eGFR  Date Value Ref Range Status  01/27/2021 75 > OR = 60 mL/min/1.7334minal    Comment:    The eGFR is based on the CKD-EPI 2021 equation. To calculate  the new eGFR from a previous Creatinine or Cystatin C result, go to https://www.kidney.org/professionals/ kdoqi/[email or web address]          Failed - Last BP in normal range    BP Readings from Last 1 Encounters:  07/04/21 (!) 148/88         Passed - K in normal range and within 180 days    Potassium  Date Value Ref Range Status  05/10/2021 4.0 3.5 - 5.3 mmol/L Final         Passed - Na in normal range and within 180 days    Sodium  Date Value Ref Range Status  05/10/2021 143 135 - 146 mmol/L Final         Passed - Cr in normal range and within 180 days    Creat  Date Value Ref Range Status  05/10/2021 0.97 0.60 - 1.00 mg/dL Final         Passed - Patient is not pregnant      Passed - Valid encounter within last 6 months    Recent Outpatient Visits          3 months ago Osteoarthritis of right knee, unspecified osteoarthritis type   [facility], [facility]   8 months ago Need for immunization against influenza   [facility], [facility]ammie McgeeD   1 year ago Flank pain   [facility]   1 year ago Change in stool habits   [facility],  [facility]ammie McgeeD   1 year ago Essential hypertension   [facility], War[facility]ammie McgeeD

## 2021-10-24 ENCOUNTER — Ambulatory Visit (INDEPENDENT_AMBULATORY_CARE_PROVIDER_SITE_OTHER): Payer: Medicare HMO | Admitting: Family Medicine

## 2021-10-24 VITALS — BP 152/86 | HR 81 | Temp 98.1°F | Ht 64.0 in | Wt 239.8 lb

## 2021-10-24 DIAGNOSIS — I10 Essential (primary) hypertension: Secondary | ICD-10-CM | POA: Diagnosis not present

## 2021-10-24 DIAGNOSIS — E7800 Pure hypercholesterolemia, unspecified: Secondary | ICD-10-CM

## 2021-10-24 DIAGNOSIS — R7303 Prediabetes: Secondary | ICD-10-CM | POA: Diagnosis not present

## 2021-10-24 LAB — CBC WITH DIFFERENTIAL/PLATELET
Absolute Monocytes: 529 cells/uL (ref 200–950)
Basophils Absolute: 32 cells/uL (ref 0–200)
Basophils Relative: 0.5 %
Eosinophils Absolute: 151 cells/uL (ref 15–500)
Eosinophils Relative: 2.4 %
HCT: 38.9 % (ref 35.0–45.0)
Hemoglobin: 13.1 g/dL (ref 11.7–15.5)
Lymphs Abs: 2608 cells/uL (ref 850–3900)
MCH: 27 pg (ref 27.0–33.0)
MCHC: 33.7 g/dL (ref 32.0–36.0)
MCV: 80 fL (ref 80.0–100.0)
MPV: 10.6 fL (ref 7.5–12.5)
Monocytes Relative: 8.4 %
Neutro Abs: 2980 cells/uL (ref 1500–7800)
Neutrophils Relative %: 47.3 %
Platelets: 277 10*3/uL (ref 140–400)
RBC: 4.86 10*6/uL (ref 3.80–5.10)
RDW: 15 % (ref 11.0–15.0)
Total Lymphocyte: 41.4 %
WBC: 6.3 10*3/uL (ref 3.8–10.8)

## 2021-10-24 LAB — COMPLETE METABOLIC PANEL WITH GFR
AG Ratio: 1.4 (calc) (ref 1.0–2.5)
ALT: 27 U/L (ref 6–29)
AST: 25 U/L (ref 10–35)
Albumin: 4.3 g/dL (ref 3.6–5.1)
Alkaline phosphatase (APISO): 51 U/L (ref 37–153)
BUN: 22 mg/dL (ref 7–25)
CO2: 31 mmol/L (ref 20–32)
Calcium: 10.1 mg/dL (ref 8.6–10.4)
Chloride: 103 mmol/L (ref 98–110)
Creat: 0.78 mg/dL (ref 0.60–0.95)
Globulin: 3.1 g/dL (calc) (ref 1.9–3.7)
Glucose, Bld: 97 mg/dL (ref 65–99)
Potassium: 3.8 mmol/L (ref 3.5–5.3)
Sodium: 142 mmol/L (ref 135–146)
Total Bilirubin: 0.6 mg/dL (ref 0.2–1.2)
Total Protein: 7.4 g/dL (ref 6.1–8.1)
eGFR: 77 mL/min/{1.73_m2} (ref >=60)

## 2021-10-24 LAB — LIPID PANEL
Cholesterol: 184 mg/dL (ref <200)
HDL: 59 mg/dL (ref >=50)
LDL Cholesterol (Calc): 108 mg/dL (calc) — ABNORMAL HIGH
Non-HDL Cholesterol (Calc): 125 mg/dL (calc) (ref <130)
Total CHOL/HDL Ratio: 3.1 (calc) (ref <5.0)
Triglycerides: 77 mg/dL (ref <150)

## 2021-10-24 NOTE — Progress Notes (Signed)
Subjective:    Patient ID: [name], female    DOB: Jan 21, 1941, 81 y.o.   MRN: [id number] Patient has stopped Celebrex because it was making her have diarrhea.  After she stopped the Celebrex, the diarrhea has improved.  She is instead using a knee brace and over-the-counter patches for the knee pain and this seems to be working for her.  She denies any chest pain or shortness of breath or dyspnea on exertion.  Her blood pressure today is elevated at 152/86 however she is a little nervous here.  She states she checks her blood pressure every day at home and is typically 130/70.  She denies any myalgias or right upper quadrant pain.  She denies any abdominal pain.  She has been having more recent heartburn that she has not been taking her Pepcid.  The heartburn improves after she burps. 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     aspirin 81 MG chewable tablet Chew 81 mg by mouth daily.     atorvastatin (LIPITOR) 40 MG tablet Take 1  tablet (40 mg total) by mouth daily. 90 tablet 3   Blood Glucose Calibration (ACCU-CHEK GUIDE CONTROL) LIQD As directed 1 each 0   Blood Glucose Monitoring Suppl (ACCU-CHEK AVIVA PLUS) w/Device KIT USE AS DIRECTED TO CHECK BLOOD SUGAR 1 kit 0   Blood Glucose Monitoring Suppl (ACCU-CHEK GUIDE) w/Device KIT As directed 1 kit 0   celecoxib (CELEBREX) 200 MG capsule Take 1 capsule (200 mg total) by mouth 2 (two) times daily. 60 capsule 1   Cholecalciferol (VITAMIN D) 2000 UNITS tablet Take 1,000 Units by mouth daily.      Coenzyme Q10 (CO Q-10) 100 MG CAPS Take by mouth.     doxazosin (CARDURA) 1 MG tablet TAKE 1 TABLET (1 MG TOTAL) BY MOUTH DAILY. 90 tablet 3   famotidine (PEPCID) 20 MG tablet Take 20 mg by mouth 2 (two) times daily.     fenofibrate 54 MG tablet TAKE 1 TABLET EVERY DAY 90 tablet 3   glucose blood (ACCU-CHEK AVIVA PLUS) test strip Use as instructed 100 strip 3   Lancet Devices (ACCU-CHEK SOFTCLIX) lancets Check BS qd - DX- E11.9 1 each 5   losartan-hydrochlorothiazide (HYZAAR) 100-25 MG tablet TAKE 1 TABLET EVERY  DAY 90 tablet 1   metoprolol succinate (TOPROL-XL) 50 MG 24 hr tablet TAKE 1 TABLET EVERY DAY WITH OR IMMEDIATELY FOLLOWING A MEAL. 90 tablet 3   naproxen sodium (ALEVE) 220 MG tablet Take 220 mg by mouth daily as needed (knee pain).     Omega-3 Fatty Acids (FISH OIL CONCENTRATE PO) Take by mouth.     Polyethyl Glycol-Propyl Glycol 0.4-0.3 % SOLN Apply to eye.     polyethylene glycol (MIRALAX / GLYCOLAX) packet Take 17 g by mouth daily.     pyridOXINE (VITAMIN B-6) 100 MG tablet Take 100 mg by mouth daily as needed.     No current facility-administered medications on file prior to visit.   Allergies  Allergen Reactions   Vytorin [Ezetimibe-Simvastatin] Other (See Comments)    myalgia   Social History   Socioeconomic History   Marital status: Widowed    Spouse name: Not on file   Number of children: Not on file   Years of education: Not on file   Highest  education level: Not on file  Occupational History   Not on file  Tobacco Use   Smoking status: Former    Types: Cigarettes    Quit date: 09/07/1973    Years since quitting: 48.1   Smokeless tobacco: Never  Vaping Use   Vaping Use: Never used  Substance and Sexual Activity   Alcohol use: No   Drug use: No   Sexual activity: Not Currently  Other Topics Concern   Not on file  Social History Narrative   Not on file   Social Determinants of Health   Financial Resource Strain: Low Risk  (06/09/2021)   Overall Financial Resource Strain (CARDIA)    Difficulty of Paying Living Expenses: Not hard at all  Food Insecurity: No Food Insecurity (06/09/2021)   Hunger Vital Sign    Worried About Running Out of Food in the Last Year: Never true    [place] in the Last Year: Never true  Transportation Needs: No Transportation Needs (06/09/2021)   PRAPARE - Hydrologist (Medical): No    Lack of Transportation (Non-Medical): No  Physical Activity: Insufficiently Active (06/09/2021)   Exercise Vital Sign    Days of Exercise per Week: 3 days    Minutes of Exercise per Session: 30 min  Stress: No Stress Concern Present (06/09/2021)   [place]    Feeling of Stress : Not at all  Social Connections: Moderately Integrated (06/09/2021)   Social Connection and Isolation Panel [NHANES]    Frequency of Communication with Friends and Family: More than three times a week    Frequency of Social Gatherings with Friends and Family: Twice a week    Attends Religious Services: 1 to 4 times per year    Active Member of [employer] or Organizations: Yes    Attends Archivist Meetings: 1 to 4 times per year    Marital Status: Widowed  Intimate Partner Violence: Not At Risk (06/09/2021)   Humiliation, Afraid, Rape, and Kick questionnaire    Fear of Current or Ex-Partner: No    Emotionally Abused: No     Physically Abused: No    Sexually Abused: No    Family History  Problem Relation Age of Onset   Stroke Paternal Grandmother    Diabetes Mother    Diabetes Sister    Diabetes Grandchild      Review of Systems  All other systems reviewed and are negative.     Objective:   Physical Exam Vitals reviewed.  Constitutional:      General: She is not in acute distress.    Appearance: Normal appearance. She is well-developed. She is obese. She is not ill-appearing, toxic-appearing or diaphoretic.  HENT:     Head: Normocephalic and atraumatic.  Neck:     Thyroid: No thyromegaly.     Vascular: No JVD.  Cardiovascular:     Rate and Rhythm: Normal rate and regular rhythm.     Pulses: Normal pulses.     Heart sounds: Murmur heard.     No friction rub. No gallop.  Pulmonary:     Effort: Pulmonary effort is normal. No respiratory distress.     Breath sounds: Normal breath sounds. No stridor. No wheezing, rhonchi or rales.  Chest:     Chest wall: No tenderness.  Abdominal:     General: Bowel sounds are decreased.  Musculoskeletal:        General: Tenderness present. No swelling or deformity.     Right knee: Decreased range of motion. Tenderness present over the medial joint line and lateral joint line.     Right lower leg: No edema.     Left lower leg: No edema.  Neurological:     Mental Status: She is alert.           Assessment & Plan:  Essential hypertension - Plan: CBC with Differential/Platelet, Lipid panel, COMPLETE METABOLIC PANEL WITH GFR  Prediabetes  Pure hypercholesterolemia - Plan: CBC with Differential/Platelet, Lipid panel, COMPLETE METABOLIC PANEL WITH GFR I am fine with the patient stopping Celebrex if the medicine was upsetting her stomach.  As long as her pain is controlled with a knee brace, I believe no additional medication is necessary.  I did encourage her to take the Pepcid on a daily basis to try to treat the heartburn more effectively.  Check CBC  CMP and lipid panel.  She does have a history of prediabetes of her fasting blood sugar is elevated I will add a hemoglobin A1c.  Goal LDL cholesterol is less than 100.  Blood pressure today is slightly elevated however I believe that is whitecoat syndrome.  Her blood pressures at home are well controlled.

## 2021-12-28 NOTE — Progress Notes (Unsigned)
Office Visit    Patient Name: [name] Date of Encounter: 12/29/2021  PCP:  Susy Frizzle, MD   [place]  Cardiologist:  Candee Furbish, MD  Advanced Practice Provider:  No care team member to display Electrophysiologist:  None    HPI    [name] is a 81 y.o. female with a past medical history significant for rheumatoid arthritis, chest pain, hyperlipidemia, hypertension, left atrial dilatation (mild), mild aortic sclerosis without stenosis, mild mitral annular calcification presents today for overdue follow-up visit.  She was last seen 08/28/2019 via telemetry visit.  She was having minimal chest pain that was occurring when laying down at night, she would burp and it would go away.  Discomfort was mild to moderate and fairly short-lived.  She wondered if it was from eating too many pecans.  Overall she been doing well at this time.  Today, she has been doing pretty well since she was last seen.  She states she still has some indigestion at night and she elevate her head which helps.  If she eats something and then goes to lay down she has the reflux symptoms.  She does not have any symptoms with exercise.  She does have a little bit of what sounds like orthostatic hypotension.  We reviewed adequate hydration and referred her to get compression hose in [place].  We reviewed her lipid panel and she was told by I believe her PCP to increase her Lipitor to 80 mg.  We have sent her in 80 mg tablets since it appears she only had 40 mg tablets.  Reports no shortness of breath nor dyspnea on exertion. Reports no chest pain, pressure, or tightness. No edema, orthopnea, PND. Reports no palpitations.    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 Left ventricular ejection fraction, by estimation, is 65 to 70%. The  left ventricle has normal function. The left ventricle has no regional  wall motion abnormalities. There is mild left ventricular hypertrophy.  Left ventricular diastolic parameters  were normal.   2. Right ventricular systolic function is normal. The right ventricular  size is normal. Tricuspid regurgitation signal is inadequate for assessing  PA pressure.   3. A small pericardial effusion is present. The pericardial effusion is  anterior to the right ventricle. There is no evidence of cardiac  tamponade.   4. The mitral valve  is normal in structure. Trivial mitral valve  regurgitation. No evidence of mitral stenosis.   5. The aortic valve is tricuspid. Aortic valve regurgitation is not  visualized. No aortic stenosis is present.   6. The inferior vena cava is normal in size with greater than 50%  respiratory variability, suggesting right atrial pressure of 3 mmHg.   EKG:  EKG is  ordered today.  The ekg ordered today demonstrates normal sinus rhythm, rate 69 bpm  Recent Labs: 10/24/2021: ALT 27; BUN 22; Creat 0.78; Hemoglobin 13.1; Platelets 277; Potassium 3.8; Sodium 142  Recent Lipid Panel    Component Value Date/Time   CHOL 184 10/24/2021 0911   TRIG 77  10/24/2021 0911   HDL 59 10/24/2021 0911   CHOLHDL 3.1 10/24/2021 0911   VLDL 15 12/12/2016 0914   LDLCALC 108 (H) 10/24/2021 0911    Home Medications   Current Meds  Medication Sig   Accu-Chek Softclix Lancets lancets Use as instructed   Alcohol Swabs (B-D SINGLE USE SWABS REGULAR) PADS As needed   amLODipine (NORVASC) 10 MG tablet TAKE 1 TABLET EVERY DAY   Ascorbic Acid (VITAMIN C) 1000 MG tablet Take 1,000 mg by mouth daily.   aspirin 81 MG chewable tablet Chew 81 mg by mouth daily.   atorvastatin (LIPITOR) 40 MG tablet Take 1 tablet (40 mg total) by mouth daily. (Patient taking differently: Take 80 mg by mouth daily.)   Blood Glucose Calibration (ACCU-CHEK GUIDE CONTROL) LIQD As directed   Blood Glucose Monitoring Suppl (ACCU-CHEK AVIVA PLUS) w/Device KIT USE AS DIRECTED TO CHECK BLOOD SUGAR   Blood Glucose Monitoring Suppl (ACCU-CHEK GUIDE) w/Device KIT As directed   celecoxib (CELEBREX) 200 MG capsule Take 1 capsule (200 mg total) by mouth 2 (two) times daily.   Cholecalciferol (VITAMIN D) 2000 UNITS tablet Take 1,000 Units by mouth daily.    Coenzyme Q10 (CO Q-10) 100 MG CAPS Take by mouth.   doxazosin (CARDURA) 1 MG tablet TAKE 1 TABLET (1 MG TOTAL) BY MOUTH DAILY.   famotidine (PEPCID) 20 MG tablet Take 20 mg by mouth 2 (two) times daily.   fenofibrate 54 MG tablet TAKE 1 TABLET EVERY DAY   glucose blood (ACCU-CHEK AVIVA PLUS) test strip Use as instructed   Lancet Devices (ACCU-CHEK SOFTCLIX) lancets Check BS qd - DX- E11.9   losartan-hydrochlorothiazide (HYZAAR) 100-25 MG tablet TAKE 1 TABLET EVERY DAY   metoprolol succinate (TOPROL-XL) 50 MG 24 hr tablet TAKE 1 TABLET EVERY DAY WITH OR IMMEDIATELY FOLLOWING A MEAL.   naproxen sodium (ALEVE) 220 MG tablet Take 220 mg by mouth daily as needed (knee pain).   Omega-3 Fatty Acids (FISH OIL CONCENTRATE PO) Take by mouth.   Polyethyl Glycol-Propyl Glycol 0.4-0.3 % SOLN Apply to eye.   polyethylene glycol (MIRALAX / GLYCOLAX)  packet Take 17 g by mouth daily.   pyridOXINE (VITAMIN B-6) 100 MG tablet Take 100 mg by mouth daily as needed.     Review of Systems      All other systems reviewed and are otherwise negative except as noted above.  Physical Exam    VS:  BP (!) 148/72   Pulse 74   Ht _0  (1.626 m)   Wt 238 lb (108 kg)   SpO2 95%   BMI 40.85 kg/m  , BMI Body mass index is 40.85 kg/m.  Wt Readings from Last 3 Encounters:  12/29/21 238 lb (108 kg)  10/24/21 239 lb 12.8 oz (108.8 kg)  07/04/21 238 lb (  108 kg)     GEN: Well nourished, well developed, in no acute distress. HEENT: normal. Neck: Supple, no JVD, carotid bruits, or masses. Cardiac: RRR, no murmurs, rubs, or gallops. No clubbing, cyanosis, edema.  Radials/PT 2+ and equal bilaterally.  Respiratory:  Respirations regular and unlabored, clear to auscultation bilaterally. GI: Soft, nontender, nondistended. MS: No deformity or atrophy. Skin: Warm and dry, no rash. Neuro:  Strength and sensation are intact. Psych: Normal affect.  Assessment & Plan    Atypical chest pain -Her symptoms sound more like GERD in nature -She will keep [place] updated if the symptoms change  Occasional leg pain -This seems to be associated with her right knee which has been swollen for many years  Hypertension -it has been normal at home -She took her blood pressure right before coming today and she said it was 137/70 -She endorses some whitecoat syndrome hypertension  Aortic atherosclerosis -We have increased her Lipitor to 80 mg daily -Most recent LDL was 108  Morbid obesity -Encouraged exercise -She states that she tries to continue moving as often as she can but has been somewhat limited due to her right    Disposition: Follow up 1 year with Candee Furbish, MD or APP.  Signed, Elgie Collard, PA-C 12/29/2021, 2:27 PM [facility]

## 2021-12-29 ENCOUNTER — Ambulatory Visit: Payer: Medicare HMO | Admitting: Physician Assistant

## 2021-12-29 ENCOUNTER — Encounter: Payer: Self-pay | Admitting: Physician Assistant

## 2021-12-29 VITALS — BP 148/72 | HR 74 | Ht 64.0 in | Wt 238.0 lb

## 2021-12-29 DIAGNOSIS — I10 Essential (primary) hypertension: Secondary | ICD-10-CM | POA: Diagnosis not present

## 2021-12-29 DIAGNOSIS — I700 Atherosclerosis of aorta: Secondary | ICD-10-CM | POA: Diagnosis not present

## 2021-12-29 DIAGNOSIS — Z6841 Body Mass Index (BMI) 40.0 and over, adult: Secondary | ICD-10-CM | POA: Diagnosis not present

## 2021-12-29 DIAGNOSIS — R0789 Other chest pain: Secondary | ICD-10-CM | POA: Diagnosis not present

## 2021-12-29 DIAGNOSIS — E782 Mixed hyperlipidemia: Secondary | ICD-10-CM

## 2021-12-29 MED ORDER — ATORVASTATIN CALCIUM 80 MG PO TABS: 80.0000 mg | ORAL_TABLET | Freq: Every day | ORAL | 3 refills | Status: DC

## 2021-12-29 NOTE — Patient Instructions (Addendum)
Medication Instructions:  1.Use voltaren gel as needed, this is over the counter 2.Increase lipitor to 80 mg daily *If you need a refill on your cardiac medications before your next appointment, please call your pharmacy*   Lab Work: None If you have labs (blood work) drawn today and your tests are completely normal, you will receive your results only by: [place] (if you have MyChart) OR A paper copy in the mail If you have any lab test that is abnormal or we need to change your treatment, we will call you to review the results.  Follow-Up: At [facility], you and your health needs are our priority.  As part of our continuing mission to provide you with exceptional heart care, we have created designated Provider Care Teams.  These Care Teams include your primary Cardiologist (physician) and Advanced Practice Providers (APPs -  Physician Assistants and Nurse Practitioners) who all work together to provide you with the care you need, when you need it.  We recommend signing up for the patient portal called "MyChart".  Sign up information is provided on this After Visit Summary.  MyChart is used to connect with patients for Virtual Visits (Telemedicine).  Patients are able to view lab/test results, encounter notes, upcoming appointments, etc.  Non-urgent messages can be sent to your provider as well.   To learn more about what you can do with MyChart, go to [email or web address].    Your next appointment:   1 year(s)  The format for your next appointment:   In Person  Provider:   Candee Furbish, MD {  Other Instructions 1.Stay hydrated, drink at least 64 oz of fluid a day 2.You may have one sugar free electrolyte drink a day 3.Be careful with position change from lying to sitting and sitting to standing 4.Keep a blood pressure log and call us if your systolic (top number) on your blood pressure is below 110 or above 140  Important Information About Sugar

## 2021-12-30 NOTE — Addendum Note (Signed)
Addended by: Wadie Lessen on: 12/30/2021 09:28 AM   Modules accepted: Orders

## 2021-12-30 NOTE — Addendum Note (Signed)
Addended by: Tamsen Snider on: 12/30/2021 02:58 PM   Modules accepted: Orders

## 2022-01-13 ENCOUNTER — Ambulatory Visit (INDEPENDENT_AMBULATORY_CARE_PROVIDER_SITE_OTHER): Payer: Medicare HMO | Admitting: Family Medicine

## 2022-01-13 VITALS — BP 136/82 | HR 70 | Temp 97.9°F | Ht 64.0 in | Wt 237.0 lb

## 2022-01-13 DIAGNOSIS — L509 Urticaria, unspecified: Secondary | ICD-10-CM | POA: Diagnosis not present

## 2022-01-13 DIAGNOSIS — Z6841 Body Mass Index (BMI) 40.0 and over, adult: Secondary | ICD-10-CM | POA: Diagnosis not present

## 2022-01-13 MED ORDER — TRIAMCINOLONE ACETONIDE 0.1 % EX CREA: 1.0000 | TOPICAL_CREAM | Freq: Two times a day (BID) | CUTANEOUS | 0 refills | Status: AC

## 2022-01-13 MED ORDER — HYDROXYZINE PAMOATE 25 MG PO CAPS: 25.0000 mg | ORAL_CAPSULE | Freq: Three times a day (TID) | ORAL | 0 refills | Status: DC | PRN

## 2022-01-13 MED ORDER — ATORVASTATIN CALCIUM 80 MG PO TABS: 80.0000 mg | ORAL_TABLET | Freq: Every day | ORAL | 3 refills | Status: DC

## 2022-01-13 NOTE — Progress Notes (Signed)
Subjective:    Patient ID: [name], female    DOB: 04-17-1941, 81 y.o.   MRN: [id number] Patient has a rash on the back of her neck as well as on her right ear and on her left forearm.  There are 2 urticarial-like papules on the back of her neck that itch.  Each of these are about 7 mm in diameter.  There is a 6 mm urticarial papule on her dorsal left forearm.  Her upper right helix is also slightly erythematous and swollen and itchy.  This has been going on now for a week.  The 3 lesions (2 on her neck, 1 on her forearm) look like bug bites. I suspect that she suffered bug bites that are itching.  There is no evidence of cellulitis, or vesicles.  Given the fact it is on her right ear and left forearm, I do not feel that it is shingles which is the patient's concern. 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     aspirin 81 MG chewable tablet Chew 81 mg by mouth daily.     Blood Glucose Calibration (ACCU-CHEK GUIDE CONTROL) LIQD As  directed 1 each 0   Blood Glucose Monitoring Suppl (ACCU-CHEK AVIVA PLUS) w/Device KIT USE AS DIRECTED TO CHECK BLOOD SUGAR 1 kit 0   Blood Glucose Monitoring Suppl (ACCU-CHEK GUIDE) w/Device KIT As directed 1 kit 0   Cholecalciferol (VITAMIN D) 2000 UNITS tablet Take 1,000 Units by mouth daily.      Coenzyme Q10 (CO Q-10) 100 MG CAPS Take by mouth.     doxazosin (CARDURA) 1 MG tablet TAKE 1 TABLET (1 MG TOTAL) BY MOUTH DAILY. 90 tablet 3   famotidine (PEPCID) 20 MG tablet Take 20 mg by mouth 2 (two) times daily.     fenofibrate 54 MG tablet TAKE 1 TABLET EVERY DAY 90 tablet 3   glucose blood (ACCU-CHEK AVIVA PLUS) test strip Use as instructed 100 strip 3   Lancet Devices (ACCU-CHEK SOFTCLIX) lancets Check BS qd - DX- E11.9 1 each 5   losartan-hydrochlorothiazide (HYZAAR) 100-25 MG tablet TAKE 1 TABLET EVERY DAY 90 tablet 1   metoprolol succinate (TOPROL-XL) 50 MG 24 hr tablet TAKE 1 TABLET EVERY DAY WITH OR IMMEDIATELY FOLLOWING A MEAL. 90 tablet  3   naproxen sodium (ALEVE) 220 MG tablet Take 220 mg by mouth daily as needed (knee pain).     Omega-3 Fatty Acids (FISH OIL CONCENTRATE PO) Take by mouth.     Polyethyl Glycol-Propyl Glycol 0.4-0.3 % SOLN Apply to eye.     polyethylene glycol (MIRALAX / GLYCOLAX) packet Take 17 g by mouth daily.     pyridOXINE (VITAMIN B-6) 100 MG tablet Take 100 mg by mouth daily as needed.     celecoxib (CELEBREX) 200 MG capsule Take 1 capsule (200 mg total) by mouth 2 (two) times daily. (Patient not taking: Reported on 01/13/2022) 60 capsule 1   No current facility-administered medications on file prior to visit.   Allergies  Allergen Reactions   Vytorin [Ezetimibe-Simvastatin] Other (See Comments)    myalgia   Social History   Socioeconomic History   Marital status: Widowed    Spouse name: Not on file   Number of children: Not on file   Years of education: Not on file   Highest education level: Not on file  Occupational History   Not on file  Tobacco  Use   Smoking status: Former    Types: Cigarettes    Quit date: 09/07/1973    Years since quitting: 48.3   Smokeless tobacco: Never  Vaping Use   Vaping Use: Never used  Substance and Sexual Activity   Alcohol use: No   Drug use: No   Sexual activity: Not Currently  Other Topics Concern   Not on file  Social History Narrative   Not on file   Social Determinants of Health   Financial Resource Strain: Low Risk  (06/09/2021)   Overall Financial Resource Strain (CARDIA)    Difficulty of Paying Living Expenses: Not hard at all  Food Insecurity: No Food Insecurity (06/09/2021)   Hunger Vital Sign    Worried About Running Out of Food in the Last Year: Never true    [place] in the Last Year: Never true  Transportation Needs: No Transportation Needs (06/09/2021)   PRAPARE - Hydrologist (Medical): No    Lack of Transportation (Non-Medical): No  Physical Activity: Insufficiently Active (06/09/2021)   Exercise Vital Sign    Days of Exercise per Week: 3 days    Minutes of Exercise per Session: 30 min  Stress: No Stress Concern Present (06/09/2021)   [place]    Feeling of Stress : Not at all  Social Connections: Moderately Integrated (06/09/2021)   Social Connection and Isolation Panel [NHANES]    Frequency of Communication with Friends and Family: More than three times a week    Frequency of Social Gatherings with Friends and Family: Twice a week    Attends Religious Services: 1 to 4 times per year    Active Member of [employer] or Organizations: Yes    Attends Archivist Meetings: 1 to 4 times per year    Marital Status: Widowed  Intimate Partner Violence: Not At Risk (06/09/2021)   Humiliation, Afraid, Rape, and Kick questionnaire    Fear of Current or Ex-Partner: No    Emotionally Abused: No    Physically Abused: No    Sexually Abused: No    Family History  Problem  Relation Age of Onset   Stroke Paternal Grandmother    Diabetes Mother    Diabetes Sister    Diabetes Grandchild      Review of  Systems  All other systems reviewed and are negative.      Objective:   Physical Exam Vitals reviewed.  Constitutional:      General: She is not in acute distress.    Appearance: Normal appearance. She is well-developed. She is obese. She is not ill-appearing, toxic-appearing or diaphoretic.  HENT:     Head: Normocephalic and atraumatic.     Ears:   Neck:     Thyroid: No thyromegaly.     Vascular: No JVD.   Cardiovascular:     Rate and Rhythm: Normal rate and regular rhythm.     Pulses: Normal pulses.     Heart sounds: Murmur heard.     No friction rub. No gallop.  Pulmonary:     Effort: Pulmonary effort is normal. No respiratory distress.     Breath sounds: Normal breath sounds. No stridor. No wheezing, rhonchi or rales.  Chest:     Chest wall: No tenderness.  Abdominal:     General: Bowel sounds are decreased.  Musculoskeletal:        General: No swelling or deformity.       Arms:     Right lower leg: No edema.     Left lower leg: No edema.  Skin:    Findings: Rash present. Rash is papular and urticarial.  Neurological:     Mental Status: She is alert.           Assessment & Plan:  Urticaria I believe the patient suffered insect bites to the back of her neck, her earlobe, and her forearm.  I recommended triamcinolone cream 2-3 times a day be applied to the affected areas and use hydroxyzine 25 mg every 8 hours as needed for itching.  Reassess in 1 week if not resolved

## 2022-03-26 IMAGING — US US  BREAST BX W/ LOC DEV 1ST LESION IMG BX SPEC US GUIDE*R*
1 series · 12 of 12 positions shown · non-contrast
Comparison: Previous exam(s).
COMPARISON: Previous exam(s).

Addendum:
CLINICAL DATA: 80-year-old female with a 1.4 cm indeterminate mass
in the right breast at 11 o'clock 2 cm from nipple.

EXAM:
ULTRASOUND GUIDED RIGHT BREAST CORE NEEDLE BIOPSY

[Series 1: us breast bx w/ loc dev 1st lesion img bx spec us  · 0.07mm/px · 12 of 12 slices shown]
[im 1/12]
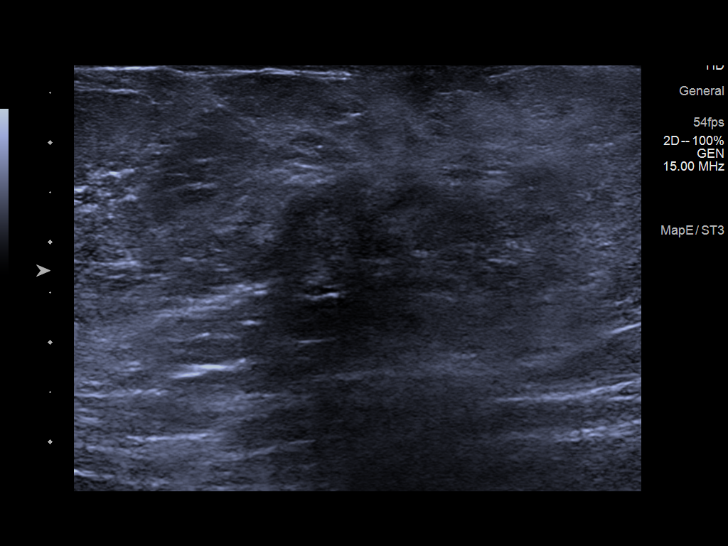
[im 2/12]
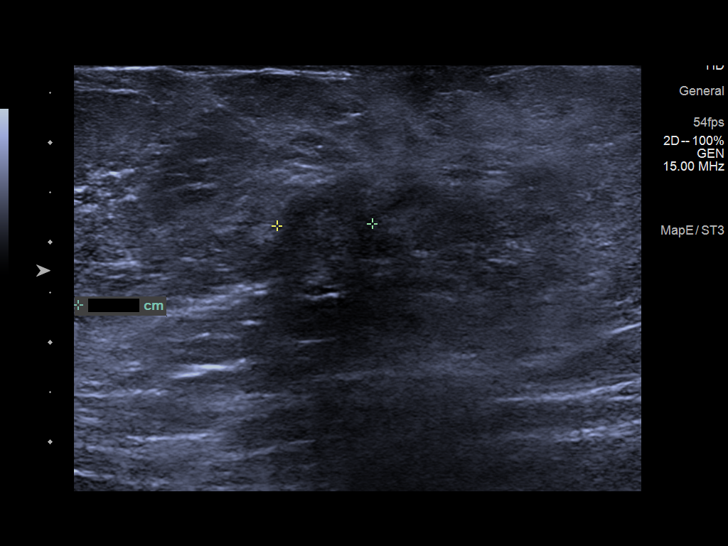
[im 3/12]
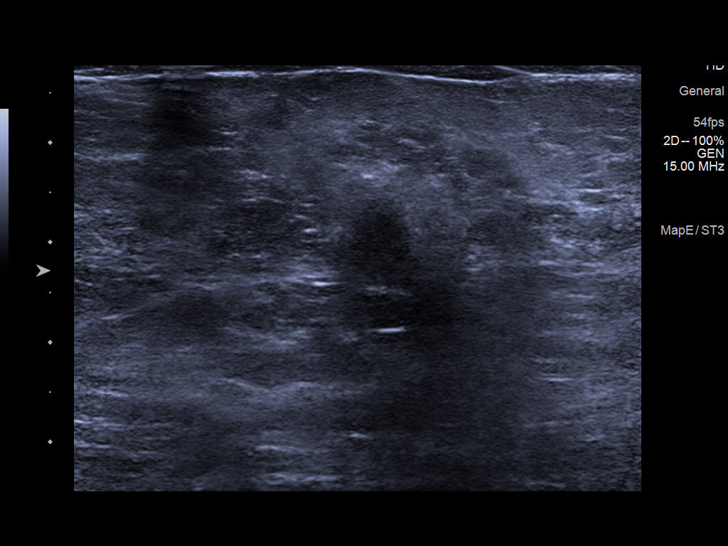
[im 4/12]
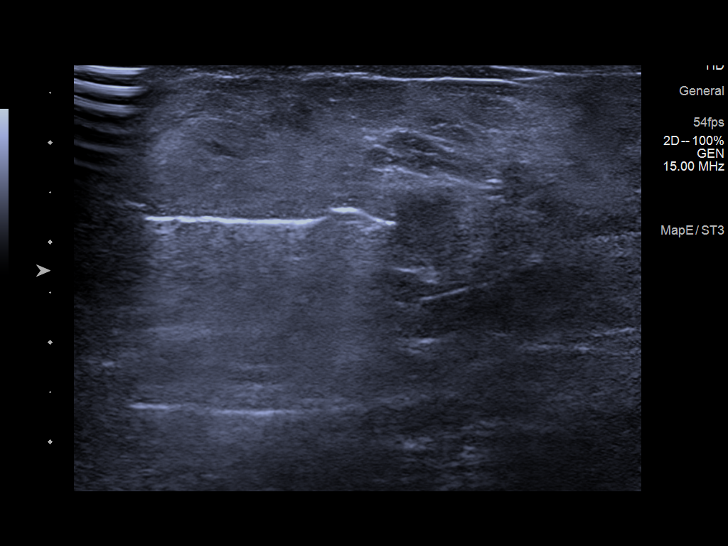
[im 5/12]
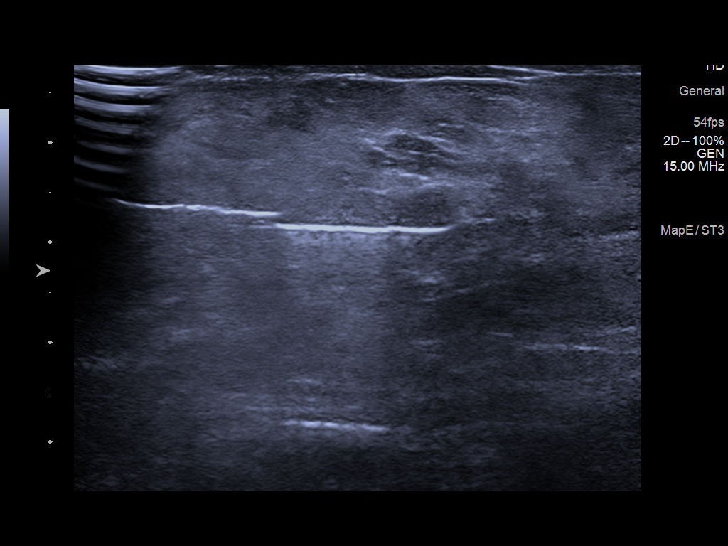
[im 6/12]
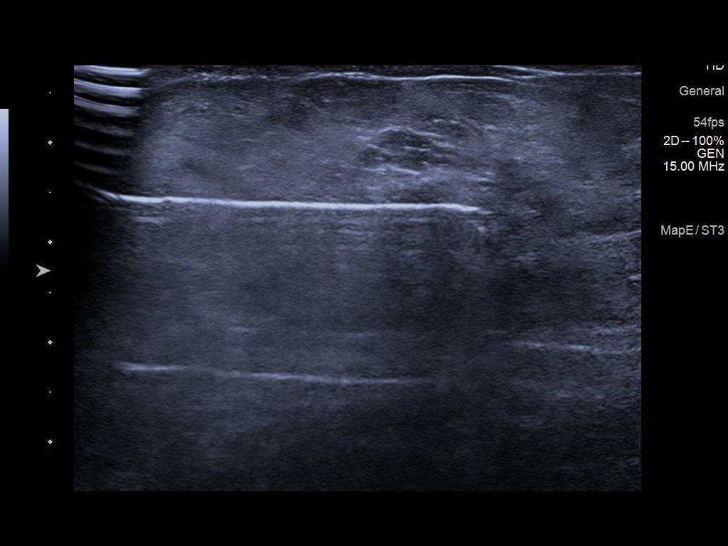
[im 7/12]
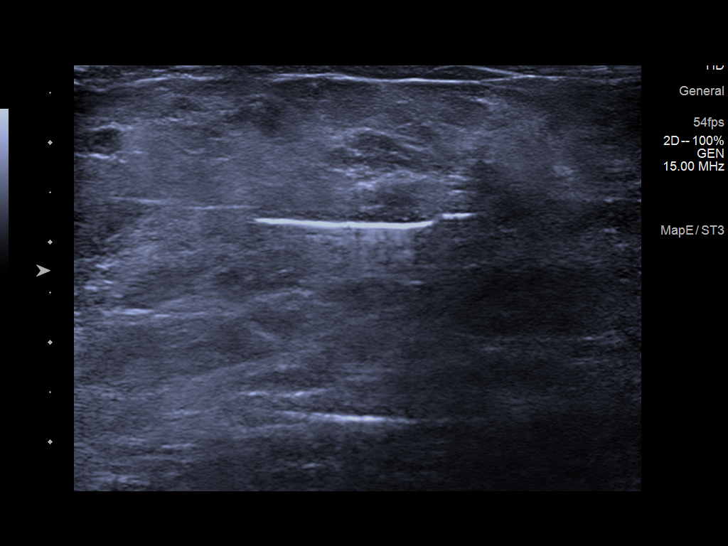
[im 8/12]
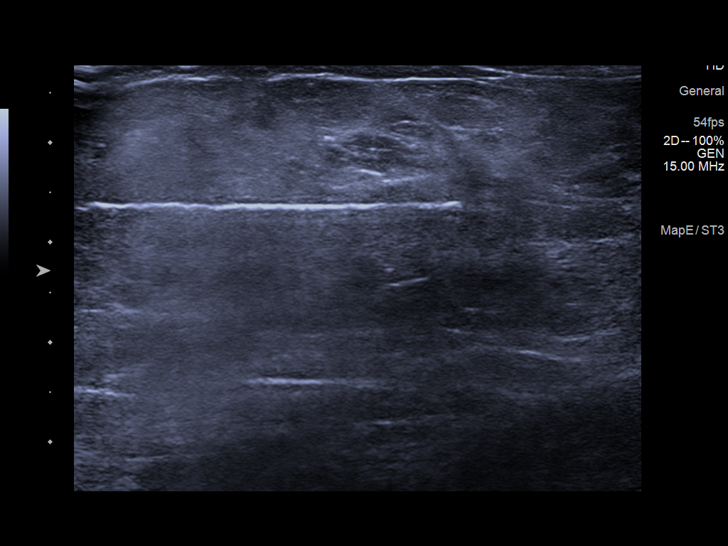
[im 9/12]
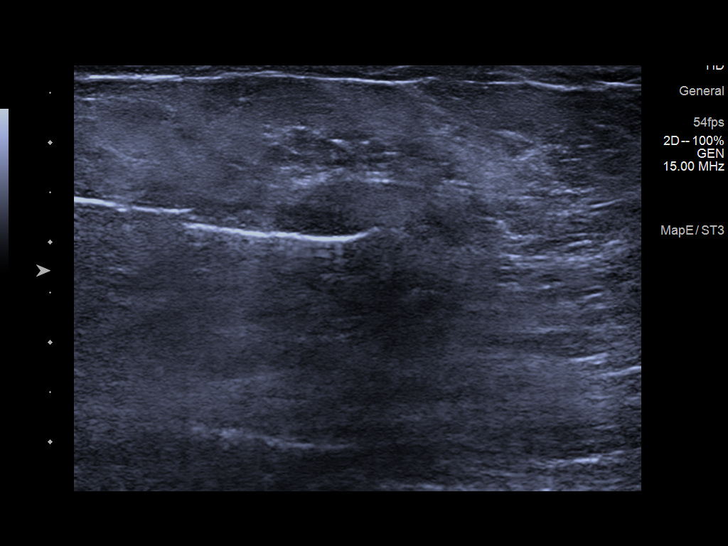
[im 10/12]
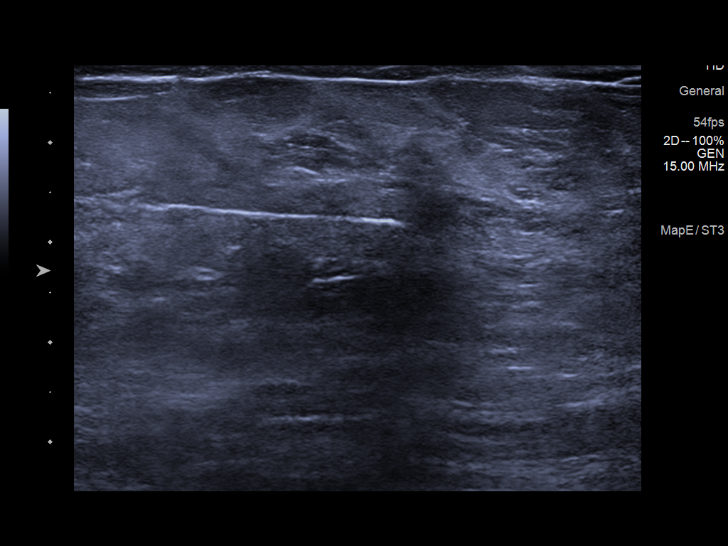
[im 11/12]
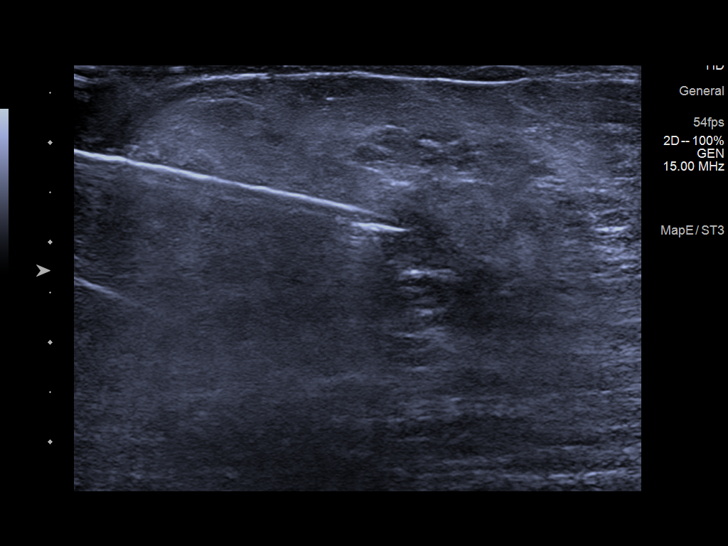
[im 12/12]
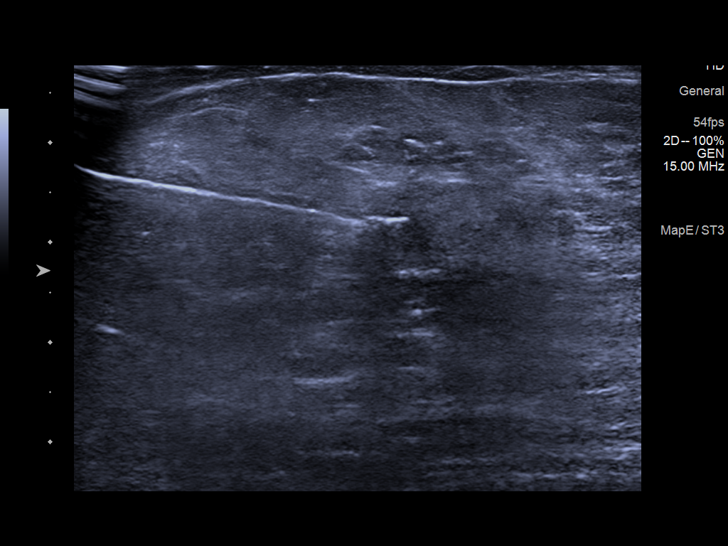

[12 of 12 positions shown; findings below may reference images not displayed]



Lesion quadrant: Upper outer

Using sterile technique and 1% Lidocaine as local anesthetic, under
direct ultrasound visualization, a 14 gauge Marckenley Tgg device was
used to perform biopsy of the vague shadowing mass in the right
breast at 11 o'clock 2 cm from nipple using a lateral to medial
approach. At the conclusion of the procedure a ribbon shaped tissue
marker clip was deployed into the biopsy cavity. Follow up 2 view
mammogram was performed and dictated separately.
IMPRESSION: Ultrasound guided biopsy of the mass in the right breast at the 11
o'clock position. No apparent complications.

ADDENDUM:
PATHOLOGY revealed: A. BREAST, RIGHT, [DATE], BIOPSY: - Benign
fibroadipose tissue with fat necrosis and associated giant cell
reaction, see comment - Negative for carcinoma.

Pathology results are CONCORDANT with imaging findings, per Dr.
Mbassi Beti.

Pathology results and recommendations were discussed with patient
via telephone on 06/01/2021. Patient reported biopsy site doing well
with no adverse symptoms, and only slight tenderness at the site.
Post biopsy care instructions were reviewed, questions were answered
and my direct phone number was provided. Patient was instructed to
call [HOSPITAL] [HOSPITAL] Mammography Department for any
additional questions or concerns related to biopsy site.

RECOMMENDATION: Patient instructed to continue monthly self breast
examinations and resume annual bilateral screening mammogram due
April 2022.

Pathology results reported by Albina Thrasher RN on 06/01/2021.



Lesion quadrant: Upper outer

Using sterile technique and 1% Lidocaine as local anesthetic, under
direct ultrasound visualization, a 14 gauge Marckenley Tgg device was
used to perform biopsy of the vague shadowing mass in the right
breast at 11 o'clock 2 cm from nipple using a lateral to medial
approach. At the conclusion of the procedure a ribbon shaped tissue
marker clip was deployed into the biopsy cavity. Follow up 2 view
mammogram was performed and dictated separately.
IMPRESSION: Ultrasound guided biopsy of the mass in the right breast at the 11
o'clock position. No apparent complications.

## 2022-03-26 IMAGING — MG MM BREAST LOCALIZATION CLIP
4 series · 4 of 12 positions shown · non-contrast
Comparison: Previous exam(s).

CLINICAL DATA: Post ultrasound-guided biopsy of a mass/vague
shadowing area in the right breast at the 11 o'clock position.

EXAM:
3D DIAGNOSTIC RIGHT MAMMOGRAM POST ULTRASOUND BIOPSY

[R CC synth-2D]
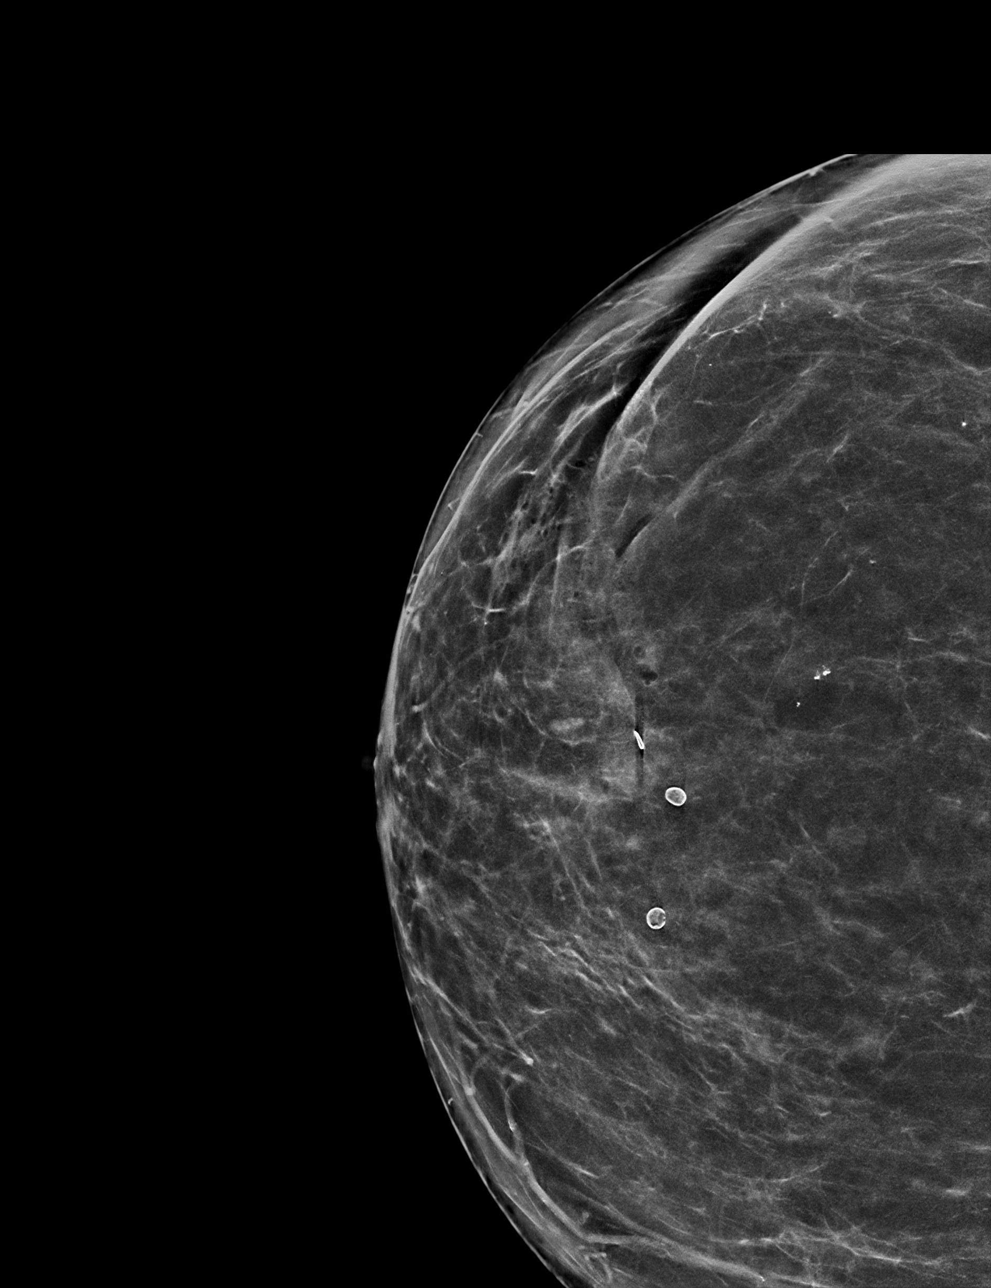

[R ML synth-2D]
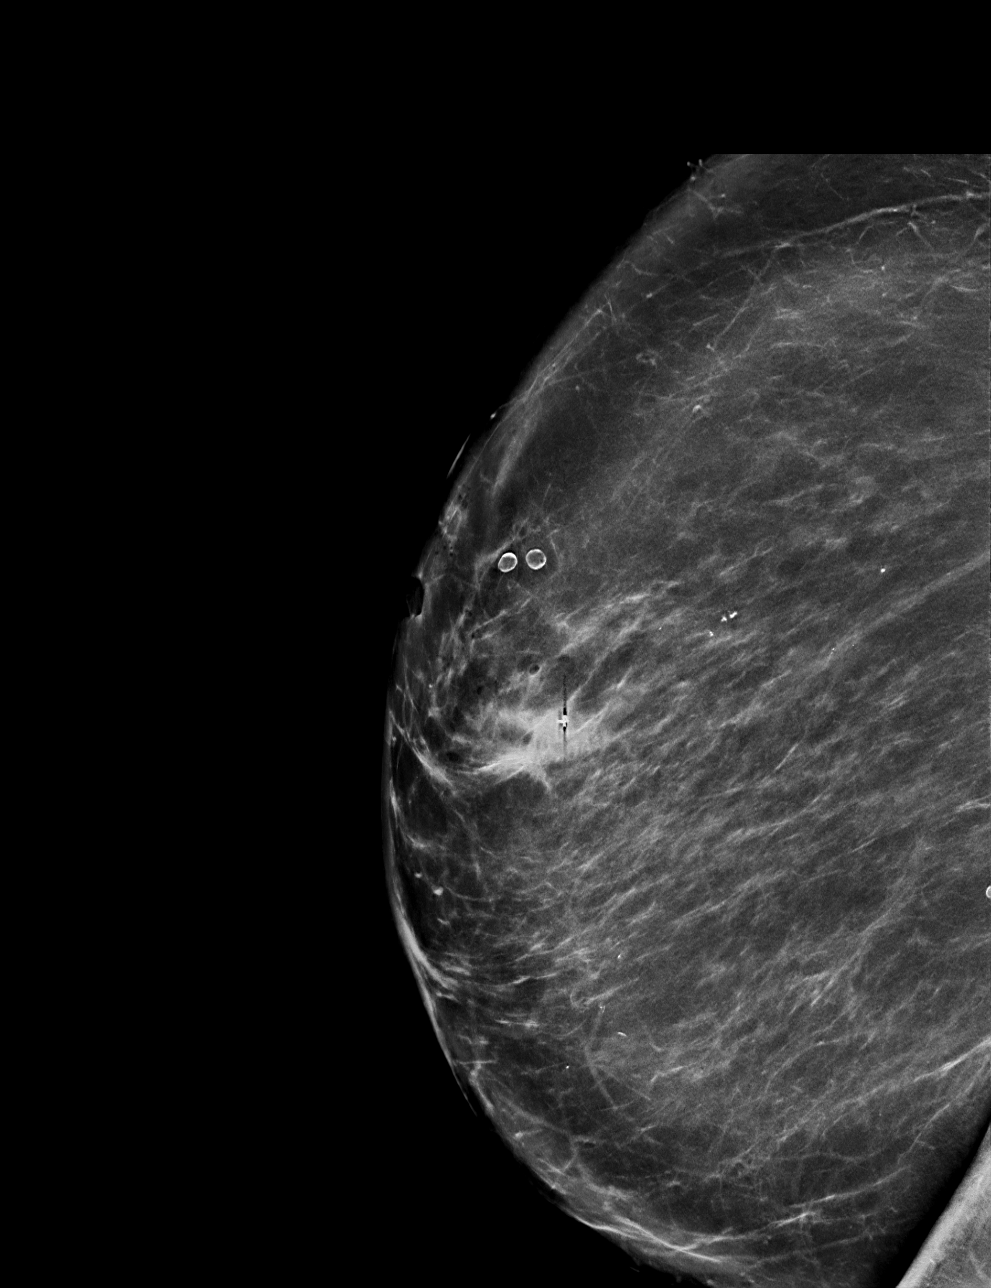

[R CC tomo · tomo slice 37/74.0]
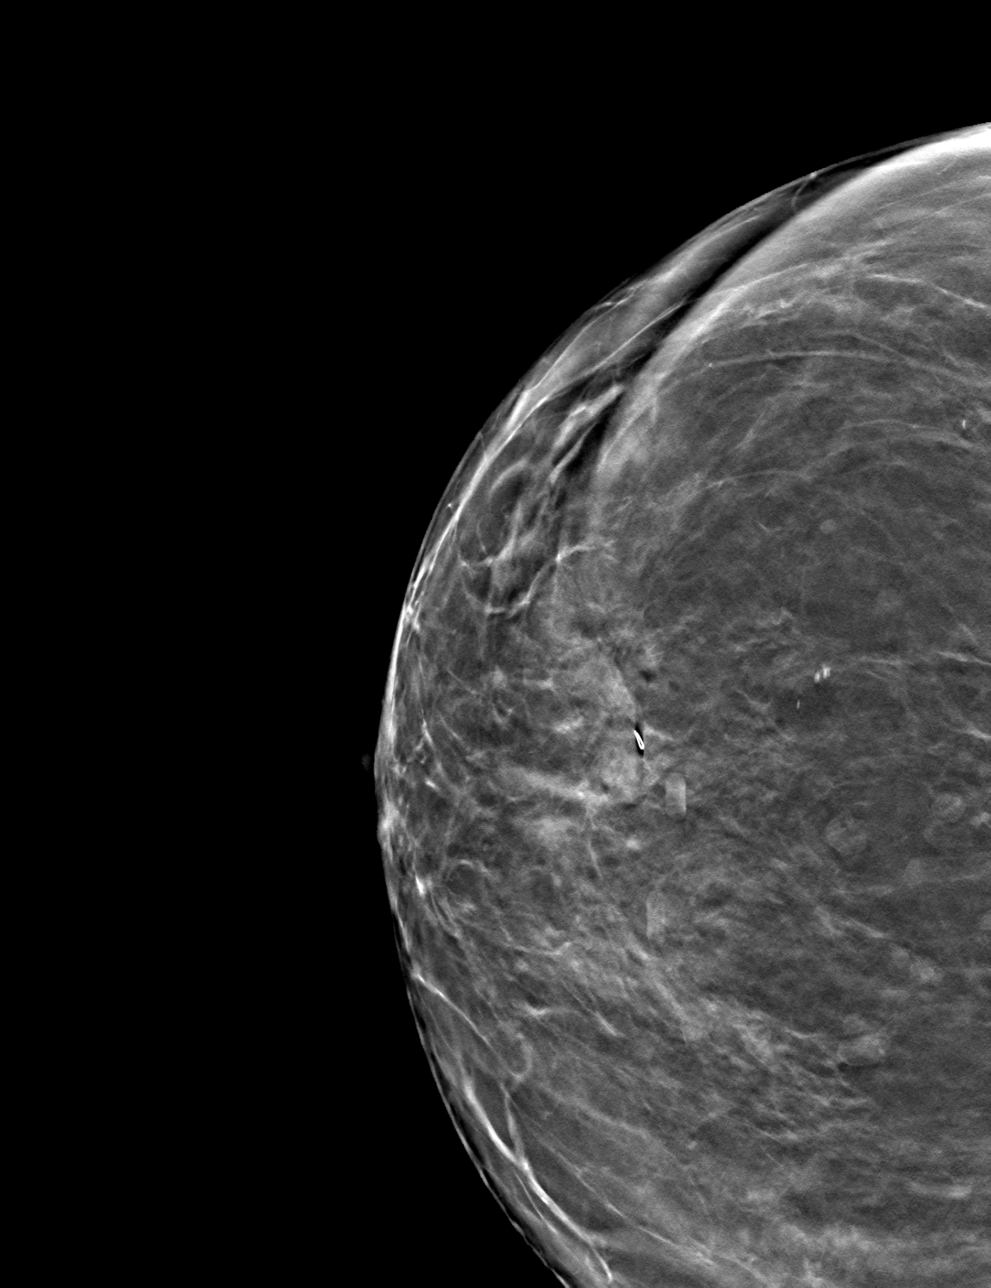

[R ML tomo · tomo slice 45/90.0]
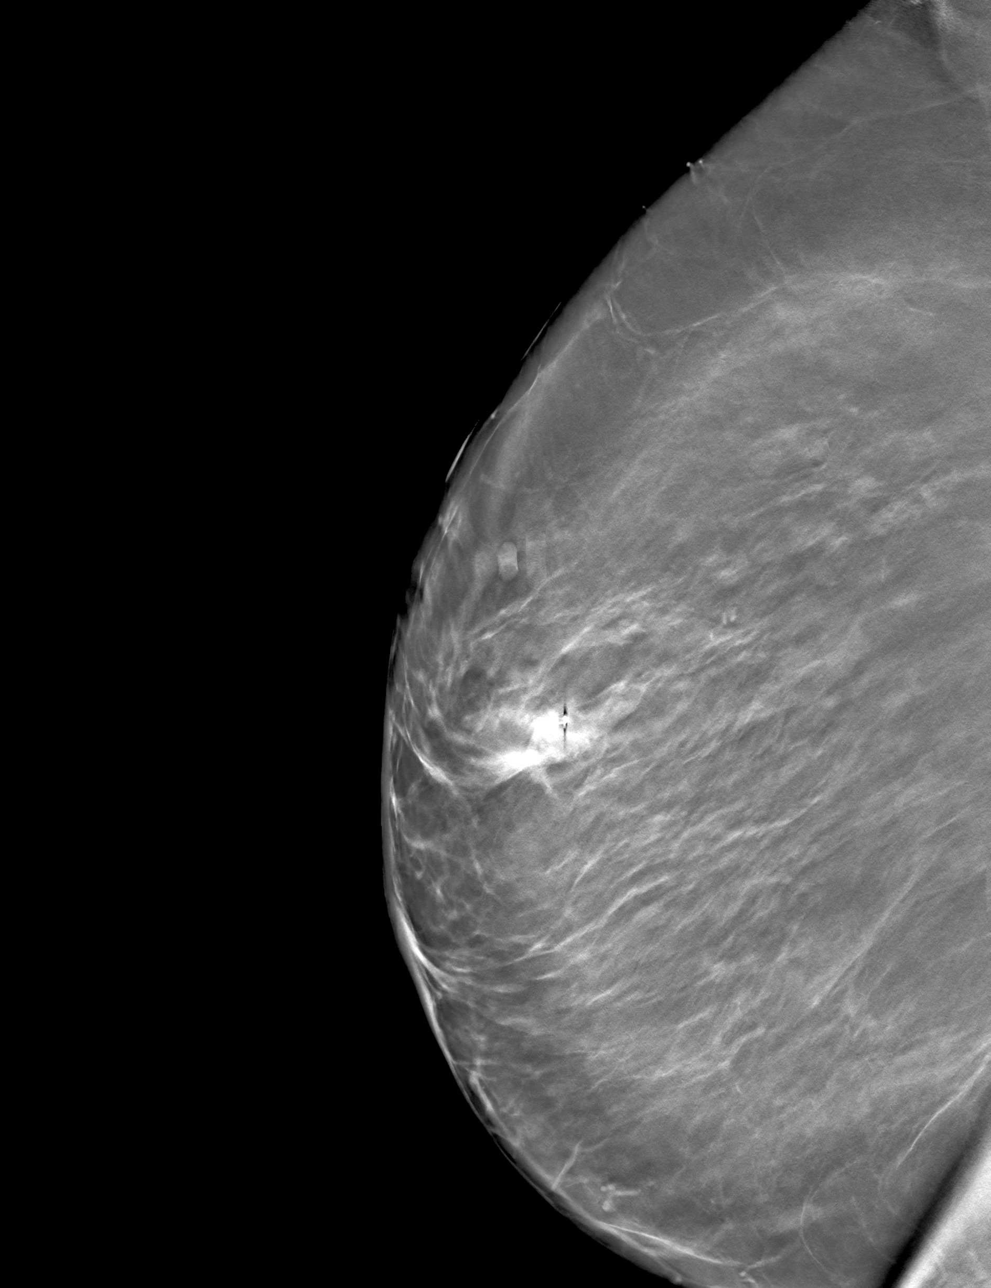

[4 of 12 positions shown; findings below may reference images not displayed]

FINDINGS: 3D Mammographic images were obtained following ultrasound guided
biopsy of a mass/vague shadowing area in the right breast at the 11
o'clock position. The ribbon shaped biopsy marking clip is at the
site of the biopsied mass/vague shadowing area in the right breast
and felt to correspond with the area of questionable distortion seen
on recent mammography.
IMPRESSION: Ribbon shaped biopsy marking clip at site of biopsied mass/shadowing
area in the right breast and felt to correspond with the area of
questionable distortion seen on recent mammography.

Final Assessment: Post Procedure Mammograms for Marker Placement

## 2022-04-13 ENCOUNTER — Other Ambulatory Visit: Payer: Self-pay | Admitting: Family Medicine

## 2022-04-14 NOTE — Telephone Encounter (Signed)
Requested Prescriptions  Pending Prescriptions Disp Refills   doxazosin (CARDURA) 1 MG tablet [Pharmacy Med Name: DOXAZOSIN MESYLATE 1 MG Tablet] 90 tablet 0    Sig: TAKE 1 TABLET EVERY DAY     Cardiovascular:  Alpha Blockers Failed - 04/13/2022  4:57 PM      Failed - Valid encounter within last 6 months    Recent Outpatient Visits           9 months ago Osteoarthritis of right knee, unspecified osteoarthritis type   [place] Susy Frizzle, MD   1 year ago Need for immunization against influenza   [place] Dennard Schaumann Cammie Mcgee, MD   1 year ago Flank pain   [place] Susy Frizzle, MD   1 year ago Change in stool habits   [place] Dennard Schaumann, Cammie Mcgee, MD   1 year ago Essential hypertension   [place], Cammie Mcgee, MD              Passed - Last BP in normal range    BP Readings from Last 1 Encounters:  01/13/22 136/82

## 2022-04-25 ENCOUNTER — Other Ambulatory Visit: Payer: Medicare HMO

## 2022-04-25 DIAGNOSIS — I10 Essential (primary) hypertension: Secondary | ICD-10-CM | POA: Diagnosis not present

## 2022-04-25 DIAGNOSIS — R7303 Prediabetes: Secondary | ICD-10-CM

## 2022-04-25 DIAGNOSIS — E7800 Pure hypercholesterolemia, unspecified: Secondary | ICD-10-CM | POA: Diagnosis not present

## 2022-04-26 LAB — COMPLETE METABOLIC PANEL WITH GFR
AG Ratio: 1.4 (calc) (ref 1.0–2.5)
ALT: 45 U/L — ABNORMAL HIGH (ref 6–29)
AST: 28 U/L (ref 10–35)
Albumin: 4.3 g/dL (ref 3.6–5.1)
Alkaline phosphatase (APISO): 57 U/L (ref 37–153)
BUN: 15 mg/dL (ref 7–25)
CO2: 29 mmol/L (ref 20–32)
Calcium: 10 mg/dL (ref 8.6–10.4)
Chloride: 105 mmol/L (ref 98–110)
Creat: 0.73 mg/dL (ref 0.60–0.95)
Globulin: 3.1 g/dL (calc) (ref 1.9–3.7)
Glucose, Bld: 102 mg/dL — ABNORMAL HIGH (ref 65–99)
Potassium: 4.1 mmol/L (ref 3.5–5.3)
Sodium: 142 mmol/L (ref 135–146)
Total Bilirubin: 0.5 mg/dL (ref 0.2–1.2)
Total Protein: 7.4 g/dL (ref 6.1–8.1)
eGFR: 83 mL/min/{1.73_m2} (ref >=60)

## 2022-04-26 LAB — CBC WITH DIFFERENTIAL/PLATELET
Absolute Monocytes: 512 cells/uL (ref 200–950)
Basophils Absolute: 31 cells/uL (ref 0–200)
Basophils Relative: 0.5 %
Eosinophils Absolute: 201 cells/uL (ref 15–500)
Eosinophils Relative: 3.3 %
HCT: 38.2 % (ref 35.0–45.0)
Hemoglobin: 12.9 g/dL (ref 11.7–15.5)
Lymphs Abs: 2715 cells/uL (ref 850–3900)
MCH: 27.3 pg (ref 27.0–33.0)
MCHC: 33.8 g/dL (ref 32.0–36.0)
MCV: 80.9 fL (ref 80.0–100.0)
MPV: 10.8 fL (ref 7.5–12.5)
Monocytes Relative: 8.4 %
Neutro Abs: 2641 cells/uL (ref 1500–7800)
Neutrophils Relative %: 43.3 %
Platelets: 259 10*3/uL (ref 140–400)
RBC: 4.72 10*6/uL (ref 3.80–5.10)
RDW: 14.9 % (ref 11.0–15.0)
Total Lymphocyte: 44.5 %
WBC: 6.1 10*3/uL (ref 3.8–10.8)

## 2022-04-26 LAB — LIPID PANEL
Cholesterol: 173 mg/dL (ref <200)
HDL: 62 mg/dL (ref >=50)
LDL Cholesterol (Calc): 94 mg/dL (calc)
Non-HDL Cholesterol (Calc): 111 mg/dL (calc) (ref <130)
Total CHOL/HDL Ratio: 2.8 (calc) (ref <5.0)
Triglycerides: 80 mg/dL (ref <150)

## 2022-04-26 LAB — HEMOGLOBIN A1C
Hgb A1c MFr Bld: 5.8 % of total Hgb — ABNORMAL HIGH (ref <5.7)
Mean Plasma Glucose: 120 mg/dL
eAG (mmol/L): 6.6 mmol/L

## 2022-05-17 ENCOUNTER — Other Ambulatory Visit: Payer: Self-pay | Admitting: Family Medicine

## 2022-05-29 ENCOUNTER — Telehealth: Payer: Self-pay | Admitting: Family Medicine

## 2022-05-29 NOTE — Telephone Encounter (Signed)
No answer unable to leave a message for patient to call back and schedule Medicare Annual Wellness Visit (AWV) in office.   If not able to come in office, please offer to do virtually or by telephone.   Last AWV:06/09/2021   Please schedule at any time with [facility]-Nurse Health Advisor.  30 minute appointment  Any questions, please contact me at [phone]   Thank you,   [facility]  Ambulatory Clinical Support for [place] [facility]. [facility] ??[phone] or ??[phone]

## 2022-06-23 ENCOUNTER — Encounter: Payer: Self-pay | Admitting: Family Medicine

## 2022-06-23 ENCOUNTER — Ambulatory Visit (INDEPENDENT_AMBULATORY_CARE_PROVIDER_SITE_OTHER): Payer: Medicare HMO | Admitting: Family Medicine

## 2022-06-23 VITALS — BP 138/72 | HR 77 | Temp 98.6°F | Ht 64.0 in | Wt 234.0 lb

## 2022-06-23 DIAGNOSIS — M069 Rheumatoid arthritis, unspecified: Secondary | ICD-10-CM | POA: Diagnosis not present

## 2022-06-23 DIAGNOSIS — Z6841 Body Mass Index (BMI) 40.0 and over, adult: Secondary | ICD-10-CM | POA: Diagnosis not present

## 2022-06-23 DIAGNOSIS — S46911A Strain of unspecified muscle, fascia and tendon at shoulder and upper arm level, right arm, initial encounter: Secondary | ICD-10-CM

## 2022-06-23 MED ORDER — DICLOFENAC SODIUM 75 MG PO TBEC: 75.0000 mg | DELAYED_RELEASE_TABLET | Freq: Two times a day (BID) | ORAL | 0 refills | Status: AC

## 2022-06-23 NOTE — Progress Notes (Signed)
Subjective:    Patient ID: [name], female    DOB: 08-25-1940, 82 y.o.   MRN: [id number] Patient is a very sweet 82 year old African-American female who presents with 3-week history of pain in her right shoulder.  She works out every day using a 3 pound weight performing range of motion activity in her shoulder.  However she has full passive and active range of motion in her glenohumeral joint without any pain or crepitus.  She has a negative empty can sign.  She is able to fully abduct her arm 160 degrees without any pain.  She has a negative Hawking sign.  She has a negative O'Brien sign.  She denies any numbness or tingling radiating down her arm.  She has normal reflexes when assessed at the brachioradialis and biceps.  She does have some tenderness to palpation over the distal deltoid muscle on the lateral aspect of her upper arm.  There is no palpable abnormality.  There is no palpable mass or swelling or erythema or rash. 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     aspirin 81 MG chewable tablet Chew 81 mg by mouth daily.     atorvastatin (LIPITOR) 80 MG tablet Take 1 tablet (80 mg total) by mouth daily. 90 tablet 3   Blood Glucose Calibration (ACCU-CHEK GUIDE CONTROL) LIQD As directed 1 each 0   Blood Glucose Monitoring Suppl (ACCU-CHEK AVIVA PLUS) w/Device KIT USE AS DIRECTED TO CHECK BLOOD SUGAR 1 kit 0   Blood Glucose Monitoring Suppl (ACCU-CHEK GUIDE) w/Device KIT As directed 1 kit 0   Cholecalciferol (VITAMIN D) 2000 UNITS tablet Take 1,000 Units by mouth daily.      Coenzyme Q10 (CO Q-10) 100 MG CAPS Take by mouth.     doxazosin (CARDURA) 1 MG tablet TAKE 1 TABLET EVERY DAY 90 tablet 0   FLUZONE HIGH-DOSE QUADRIVALENT 0.7 ML SUSY      glucose blood (ACCU-CHEK AVIVA PLUS) test strip Use as instructed 100 strip 3   Lancet Devices (ACCU-CHEK SOFTCLIX) lancets Check BS qd - DX- E11.9 1 each 5   losartan-hydrochlorothiazide (HYZAAR) 100-25 MG tablet TAKE 1 TABLET EVERY DAY 90 tablet 3   metoprolol succinate (TOPROL-XL) 50 MG 24 hr tablet TAKE 1 TABLET EVERY DAY WITH  OR IMMEDIATELY FOLLOWING A MEAL. 90 tablet 3   Omega-3 Fatty Acids (FISH OIL CONCENTRATE PO) Take by mouth.     Polyethyl Glycol-Propyl Glycol 0.4-0.3 % SOLN Apply to eye.     polyethylene glycol (MIRALAX / GLYCOLAX) packet Take 17 g by mouth daily.     pyridOXINE (VITAMIN B-6) 100 MG tablet Take 100 mg by mouth daily as needed.     triamcinolone cream (KENALOG) 0.1 % Apply 1 Application topically 2 (two) times daily. 30 g 0   No current facility-administered medications on file prior to visit.   Allergies  Allergen Reactions   Vytorin [Ezetimibe-Simvastatin] Other (See Comments)    myalgia   Social History   Socioeconomic History   Marital status: Widowed    Spouse name: Not on file   Number of children: Not on file   Years of education: Not on file   Highest education level: Not on file  Occupational History   Not on file  Tobacco Use    Smoking status: Former    Types: Cigarettes    Quit date: 09/07/1973    Years since quitting: 48.8   Smokeless tobacco: Never  Vaping Use   Vaping Use: Never used  Substance and Sexual Activity   Alcohol use: No   Drug use: No   Sexual activity: Not Currently  Other Topics Concern   Not on file  Social History Narrative   Not on file   Social Determinants of Health   Financial Resource Strain: Low Risk  (06/09/2021)   Overall Financial Resource Strain (CARDIA)    Difficulty of Paying Living Expenses: Not hard at all  Food Insecurity: No Food Insecurity (06/09/2021)   Hunger Vital Sign    Worried About Running Out of Food in the Last Year: Never true    [place] in the Last Year: Never true  Transportation Needs: No Transportation Needs (06/09/2021)   PRAPARE - Hydrologist (Medical): No    Lack of Transportation (Non-Medical): No  Physical Activity: Insufficiently Active (06/09/2021)   Exercise Vital Sign    Days of Exercise per Week: 3 days    Minutes of Exercise per Session: 30 min  Stress: No Stress Concern Present (06/09/2021)   [place]    Feeling of Stress : Not at all  Social Connections: Moderately Integrated (06/09/2021)   Social Connection and Isolation Panel [NHANES]    Frequency of Communication with Friends and Family: More than three times a week    Frequency of Social Gatherings with Friends and Family: Twice a week    Attends Religious Services: 1 to 4 times per year    Active Member of [employer] or Organizations: Yes    Attends Archivist Meetings: 1 to 4 times per year    Marital Status: Widowed  Intimate Partner Violence: Not At Risk (06/09/2021)   Humiliation, Afraid, Rape, and Kick questionnaire    Fear of Current or Ex-Partner: No    Emotionally Abused: No    Physically Abused: No    Sexually Abused: No    Family History  Problem Relation  Age of Onset   Stroke Paternal Grandmother    Diabetes Mother    Diabetes Sister    Diabetes Grandchild      Review of Systems  All other systems reviewed and are negative.      Objective:   Physical Exam Vitals reviewed.  Constitutional:  General: She is not in acute distress.    Appearance: Normal appearance. She is well-developed. She is obese. She is not ill-appearing, toxic-appearing or diaphoretic.  HENT:     Head: Normocephalic and atraumatic.     Ears:   Neck:     Thyroid: No thyromegaly.     Vascular: No JVD.   Cardiovascular:     Rate and Rhythm: Normal rate and regular rhythm.     Pulses: Normal pulses.     Heart sounds: Murmur heard.     No friction rub. No gallop.  Pulmonary:     Effort: Pulmonary effort is normal. No respiratory distress.     Breath sounds: Normal breath sounds. No stridor. No wheezing, rhonchi or rales.  Chest:     Chest wall: No tenderness.  Abdominal:     General: Bowel sounds are decreased.  Musculoskeletal:        General: No swelling or deformity.     Right shoulder: Tenderness present. No bony tenderness or crepitus. Normal range of motion. Normal strength.     Left shoulder: No tenderness, bony tenderness or crepitus. Normal range of motion. Normal strength.       Arms:     Right lower leg: No edema.     Left lower leg: No edema.  Neurological:     Mental Status: She is alert.           Assessment & Plan:  Strain of right shoulder, initial encounter I believe the patient has strained her deltoid muscle.  Use diclofenac 75 mg twice daily for 2 weeks.  If gradually improving at that point no further workup is necessary.  I see no indication to require imaging of the shoulder at the present time.  Recheck immediately if symptoms change or worsen.  I do not expect this to be arthritis in the glenohumeral joint based on her exam and her excellent range of motion.  There is no evidence of any rotator cuff pathology.  The  only area that is abnormal is some tenderness over the distal deltoid

## 2022-06-26 ENCOUNTER — Telehealth: Payer: Self-pay

## 2022-06-26 NOTE — Telephone Encounter (Signed)
Pt called this morning stating that, she started the new muscle relaxer meds on Friday as directed. However, this morning pt stated that she had 2 BM's and her stools were black, which is concerning to her.  Pls advice?

## 2022-06-27 ENCOUNTER — Other Ambulatory Visit: Payer: Self-pay

## 2022-06-27 DIAGNOSIS — K921 Melena: Secondary | ICD-10-CM

## 2022-06-27 NOTE — Telephone Encounter (Signed)
Spoke w/pt this afternoon re: black stool.   Told pt that pcp would like for her to get a stool lab done to check for blood. Pt voiced understanding and will come in tom. For lab

## 2022-06-28 ENCOUNTER — Other Ambulatory Visit: Payer: Self-pay

## 2022-06-28 DIAGNOSIS — K921 Melena: Secondary | ICD-10-CM

## 2022-06-30 DIAGNOSIS — K921 Melena: Secondary | ICD-10-CM | POA: Diagnosis not present

## 2022-07-05 LAB — FECAL GLOBIN BY IMMUNOCHEMISTRY
FECAL GLOBIN RESULT:: NOT DETECTED
MICRO NUMBER:: 14588847
SPECIMEN QUALITY:: ADEQUATE

## 2022-07-12 ENCOUNTER — Other Ambulatory Visit: Payer: Self-pay | Admitting: Family Medicine

## 2022-07-20 ENCOUNTER — Ambulatory Visit (INDEPENDENT_AMBULATORY_CARE_PROVIDER_SITE_OTHER): Payer: Medicare HMO

## 2022-07-20 VITALS — Ht 64.0 in | Wt 234.0 lb

## 2022-07-20 DIAGNOSIS — Z0000 Encounter for general adult medical examination without abnormal findings: Secondary | ICD-10-CM

## 2022-07-20 NOTE — Progress Notes (Signed)
Subjective:   [name] is a 82 y.o. female who presents for Medicare Annual (Subsequent) preventive examination.  Review of Systems     Cardiac Risk Factors include: advanced age (>59mn, >>2women);dyslipidemia;hypertension;sedentary lifestyle     Objective:    Today's Vitals   07/20/22 1254  Weight: 234 lb (106.1 kg)  Height: '5\' 4"'$  (1.626 m)   Body mass index is 40.17 kg/m.     07/20/2022    1:04 PM 06/09/2021    9:18 AM 10/18/2020   11:31 AM 12/06/2017    8:46 AM  Advanced Directives  Does Patient Have a Medical Advance Directive? No No No No  Would patient like information on creating a medical advance directive? Yes (MAU/Ambulatory/Procedural Areas - Information given) No - Patient declined No - Patient declined Yes (MAU/Ambulatory/Procedural Areas - Information given)    Current Medications (verified) Outpatient Encounter Medications as of 07/20/2022  Medication Sig   Accu-Chek Softclix Lancets lancets Use as instructed   Alcohol Swabs (B-D SINGLE USE SWABS REGULAR) PADS As needed   amLODipine (NORVASC) 10 MG tablet TAKE 1 TABLET EVERY DAY   Ascorbic Acid (VITAMIN C) 1000 MG tablet Take 1,000 mg by mouth daily.   aspirin 81 MG chewable tablet Chew 81 mg by mouth daily.   atorvastatin (LIPITOR) 80 MG tablet Take 1 tablet (80 mg total) by mouth daily.   Blood Glucose Calibration (ACCU-CHEK GUIDE CONTROL) LIQD As directed   Blood Glucose Monitoring Suppl (ACCU-CHEK AVIVA PLUS) w/Device KIT USE AS DIRECTED TO CHECK BLOOD SUGAR   Blood Glucose Monitoring Suppl (ACCU-CHEK GUIDE) w/Device KIT As directed   Cholecalciferol (VITAMIN D) 2000 UNITS tablet Take 1,000 Units by mouth daily.    Coenzyme Q10 (CO Q-10) 100 MG CAPS Take by mouth.   diclofenac (VOLTAREN) 75 MG EC tablet Take 1 tablet (75 mg total) by mouth 2 (two) times daily.   doxazosin (CARDURA) 1 MG tablet TAKE 1 TABLET EVERY DAY   glucose blood (ACCU-CHEK AVIVA PLUS) test strip Use as instructed   Lancet  Devices (ACCU-CHEK SOFTCLIX) lancets Check BS qd - DX- E11.9   losartan-hydrochlorothiazide (HYZAAR) 100-25 MG tablet TAKE 1 TABLET EVERY DAY   metoprolol succinate (TOPROL-XL) 50 MG 24 hr tablet TAKE 1 TABLET EVERY DAY WITH OR IMMEDIATELY FOLLOWING A MEAL.   Omega-3 Fatty Acids (FISH OIL CONCENTRATE PO) Take by mouth.   Polyethyl Glycol-Propyl Glycol 0.4-0.3 % SOLN Apply to eye.   polyethylene glycol (MIRALAX / GLYCOLAX) packet Take 17 g by mouth daily.   pyridOXINE (VITAMIN B-6) 100 MG tablet Take 100 mg by mouth daily as needed.   triamcinolone cream (KENALOG) 0.1 % Apply 1 Application topically 2 (two) times daily.   [DISCONTINUED] FLUZONE HIGH-DOSE QUADRIVALENT 0.7 ML SUSY    No facility-administered encounter medications on file as of 07/20/2022.    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 [facility]   Past Surgical History:  Procedure Laterality Date   lt eye implant  1998   rt knee surg  1991   TUBAL LIGATION  1981   Family History  Problem Relation Age of Onset   Stroke Paternal Grandmother    Diabetes Mother    Diabetes Sister    Diabetes Grandchild    Social History   Socioeconomic History   Marital status: Widowed    Spouse name: Not on file   Number of children: Not on file   Years of education: Not on file   Highest education level: Not on file  Occupational History   Not on file  Tobacco Use   Smoking status: Former    Types: Cigarettes     Quit date: 09/07/1973    Years since quitting: 48.8   Smokeless tobacco: Never  Vaping Use   Vaping Use: Never used  Substance and Sexual Activity   Alcohol use: No   Drug use: No   Sexual activity: Not Currently  Other Topics Concern   Not on file  Social History Narrative   Not on file   Social Determinants of Health   Financial Resource Strain: Low Risk  (07/20/2022)   Overall Financial Resource Strain (CARDIA)    Difficulty of Paying Living Expenses: Not hard at all  Food Insecurity: No Food Insecurity (07/20/2022)   Hunger Vital Sign    Worried About Running Out of Food in the Last Year: Never true    [place] in the Last Year: Never true  Transportation Needs: No Transportation Needs (07/20/2022)   PRAPARE - Hydrologist (Medical): No    Lack of Transportation (Non-Medical): No  Physical Activity: Insufficiently Active (07/20/2022)   Exercise Vital Sign    Days of Exercise per Week: 3 days    Minutes of Exercise per Session: 30 min  Stress: No Stress Concern Present (07/20/2022)   [place]    Feeling of Stress : Not at all  Social Connections: Moderately Integrated (07/20/2022)   Social Connection and Isolation Panel [NHANES]    Frequency of Communication with Friends and Family: More than three times a week    Frequency of Social Gatherings with Friends and Family: Twice a week    Attends Religious Services: 1 to 4 times per year    Active Member of [employer] or Organizations: Yes    Attends Archivist Meetings: 1 to 4 times per year    Marital Status: Widowed    Tobacco Counseling Counseling given: Not Answered   Clinical Intake:  Pre-visit preparation completed: Yes  Pain : No/denies pain  Diabetes: No  How often do you need to have someone help you when you read instructions, pamphlets, or other written materials from your doctor or pharmacy?: 1 -  Never  Diabetic?No   Interpreter Needed?: No  Information entered by :: Denman George LPN   Activities of Daily Living    07/20/2022    1:05 PM  In your present state of health, do you have any difficulty performing the following activities:  Hearing? 0  Vision? 0  Difficulty concentrating or making decisions? 0  Walking or climbing stairs? 1  Dressing or bathing? 0  Doing errands, shopping? 1  Preparing Food and eating ? N  Using the Toilet? N  In the past six months, have you  accidently leaked urine? N  Do you have problems with loss of bowel control? N  Managing your Medications? N  Managing your Finances? N  Housekeeping or managing your Housekeeping? N    Patient Care Team: Susy Frizzle, MD as PCP - General (Family Medicine) Jerline Pain, MD as PCP - Cardiology (Cardiology) Edythe Clarity, [facility] as Pharmacist (Pharmacist)  Indicate any recent Medical Services you may have received from other than [facility] providers in the past year (date may be approximate).     Assessment:   This is a routine wellness examination for Tatum.  Hearing/Vision screen Hearing Screening - Comments:: Denies hearing difficulties  Vision Screening - Comments:: Wears rx glasses - up to date with routine eye exams with Dr. Einar Gip   Dietary issues and exercise activities discussed: Current Exercise Habits: Home exercise routine, Type of exercise: walking, Time (Minutes): 30, Frequency (Times/Week): 3, Weekly Exercise (Minutes/Week): 90, Intensity: Mild   Goals Addressed             This Visit's Progress    COMPLETED: LDL < 100       Timeframe:  Long-Range Goal Priority:  High Start Date:   05/12/21                          Expected End Date:  11/10/21                     Follow Up Date 08/10/21    Aim for LDL < 100   Why is this important?   These steps will help you keep on track with your medicines.   Notes: Resumed Lipitor 05/12/21     Prevent falls        Remain active and independent       COMPLETED: Track and Manage My Blood Pressure-Hypertension (GOAL < 140/90)       Timeframe:  Long-Range Goal Priority:  High Start Date:    09/28/20                         Expected End Date: 03/31/21                      Follow Up Date 12/12/20   - check blood pressure 3 times per week - choose a place to take my blood pressure (home, clinic or office, retail store) - write blood pressure results in a log or diary    Why is this important?   You won't feel high blood pressure, but it can still hurt your blood vessels.  High blood pressure can cause heart or kidney problems. It can also cause a stroke.  Making lifestyle changes like losing a little weight or eating less salt will help.  Checking your blood pressure at home and at different times of the day can help to control blood pressure.  If the doctor prescribes medicine remember to take it the way the doctor ordered.  Call the office if you cannot afford the medicine or if there are questions about it.     Notes:      COMPLETED: Weight (lb) < 200 lb (90.7 kg)   234 lb (106.1 kg)    Will refer you to nutrition to help you with food choices.  Keep up the good work with your daily exercises!        Depression Screen    07/20/2022  1:03 PM 06/23/2022    9:48 AM 06/09/2021    9:13 AM 07/08/2019   11:13 AM 12/06/2017    8:12 AM 06/14/2017    8:44 AM 02/11/2016    8:29 AM  PHQ 2/9 Scores  PHQ - 2 Score 0 0 0 0 0 0 0  PHQ- 9 Score     0      Fall Risk    07/20/2022   12:59 PM 06/23/2022    9:48 AM 06/09/2021    9:19 AM 06/08/2020    8:56 AM 07/08/2019   11:13 AM  Fall Risk   Falls in the past year? '1 1 1 '$ 0 0  Number falls in past yr: '1 1 1 '$ 0   Injury with Fall? 1 1 0 0   Risk for fall due to : History of fall(s);Impaired balance/gait;Impaired mobility History of fall(s);Impaired balance/gait;Orthopedic patient History of fall(s);Impaired balance/gait    Follow up Falls evaluation  completed;Education provided;Falls prevention discussed Education provided;Falls prevention discussed Falls prevention discussed Falls evaluation completed Falls evaluation completed    FALL RISK PREVENTION PERTAINING TO THE HOME:  Any stairs in or around the home? No  If so, are there any without handrails? No  Home free of loose throw rugs in walkways, pet beds, electrical cords, etc? Yes  Adequate lighting in your home to reduce risk of falls? Yes   ASSISTIVE DEVICES UTILIZED TO PREVENT FALLS:  Life alert? No  Use of a cane, walker or w/c? Yes  Grab bars in the bathroom? Yes  Shower chair or bench in shower? No  Elevated toilet seat or a handicapped toilet? Yes   TIMED UP AND GO:  Was the test performed? No . Telephonic visit   Cognitive Function:    12/06/2017    8:51 AM  MMSE - Mini Mental State Exam  Orientation to time 5  Orientation to Place 5  Registration 3  Attention/ Calculation 0  Recall 3  Language- name 2 objects 2  Language- repeat 1  Language- follow 3 step command 3  Language- read & follow direction 1  Write a sentence 1  Copy design 0  Total score 24        07/20/2022    1:05 PM 06/09/2021    9:27 AM  6CIT Screen  What Year? 0 points 0 points  What month? 0 points 0 points  What time? 0 points 0 points  Count back from 20 0 points 0 points  Months in reverse 2 points 4 points  Repeat phrase 2 points 2 points  Total Score 4 points 6 points    Immunizations Immunization History  Administered Date(s) Administered   Fluad Quad(high Dose 65+) 01/31/2019, 01/28/2020, 01/27/2021   Influenza Whole 01/17/2010, 02/07/2012   Influenza, High Dose Seasonal PF 02/13/2017   Influenza,inj,Quad PF,6+ Mos 02/04/2013, 02/11/2014, 02/16/2015, 01/18/2016, 01/25/2018   PFIZER(Purple Top)SARS-COV-2 Vaccination 07/12/2019, 08/09/2019, 03/05/2020   Pneumococcal Conjugate-13 05/28/2014   Pneumococcal Polysaccharide-23 04/25/2005, 02/11/2013   Td 10/14/1998    Tdap 12/06/2017    TDAP status: Up to date  Flu Vaccine status: Up to date  Pneumococcal vaccine status: Up to date  Covid-19 vaccine status: Information provided on how to obtain vaccines.   Qualifies for Shingles Vaccine? No   Zostavax completed No   Shingrix Completed?: No.    Education has been provided regarding the importance of this vaccine. Patient has been advised to call insurance company to determine out of pocket expense if they have not yet  received this vaccine. Advised may also receive vaccine at local pharmacy or Health Dept. Verbalized acceptance and understanding.  Screening Tests Health Maintenance  Topic Date Due   Zoster Vaccines- Shingrix (1 of 2) Never done   INFLUENZA VACCINE  12/13/2021   COVID-19 Vaccine (4 - 2023-24 season) 01/13/2022   DEXA SCAN  07/20/2023 (Originally 07/28/2006)   Medicare Annual Wellness (AWV)  07/20/2023   DTaP/Tdap/Td (3 - Td or Tdap) 12/07/2027   Pneumonia Vaccine 69+ Years old  Completed   HPV VACCINES  Aged Out    Health Maintenance  Health Maintenance Due  Topic Date Due   Zoster Vaccines- Shingrix (1 of 2) Never done   INFLUENZA VACCINE  12/13/2021   COVID-19 Vaccine (4 - 2023-24 season) 01/13/2022    Colorectal cancer screening: No longer required.   Mammogram status: No longer required due to age.  Bone density status: Patient declines at this time   Lung Cancer Screening: (Low Dose CT Chest recommended if Age 62-80 years, 30 pack-year currently smoking OR have quit w/in 15years.) does not qualify.   Lung Cancer Screening Referral: n/a  Additional Screening:  Hepatitis C Screening: does not qualify;  Vision Screening: Recommended annual ophthalmology exams for early detection of glaucoma and other disorders of the eye. Is the patient up to date with their annual eye exam?  Yes  Who is the provider or what is the name of the office in which the patient attends annual eye exams? Dr. Einar Gip  If pt is not  established with a provider, would they like to be referred to a provider to establish care? No .   Dental Screening: Recommended annual dental exams for proper oral hygiene  Community Resource Referral / Chronic Care Management: CRR required this visit?  No   CCM required this visit?  No      Plan:     I have personally reviewed and noted the following in the patient's chart:   Medical and social history Use of alcohol, tobacco or illicit drugs  Current medications and supplements including opioid prescriptions. Patient is not currently taking opioid prescriptions. Functional ability and status Nutritional status Physical activity Advanced directives List of other physicians Hospitalizations, surgeries, and ER visits in previous 12 months Vitals Screenings to include cognitive, depression, and falls Referrals and appointments  In addition, I have reviewed and discussed with patient certain preventive protocols, quality metrics, and best practice recommendations. A written personalized care plan for preventive services as well as general preventive health recommendations were provided to patient.     Vanetta Mulders, [place]   [id number]   Due to this being a virtual visit, the after visit summary with patients personalized plan was offered to patient via mail or my-chart.  per request, patient was mailed a copy of AVS  Nurse Notes: No concerns

## 2022-07-20 NOTE — Patient Instructions (Addendum)
[name] , Thank you for taking time to come for your Medicare Wellness Visit. I appreciate your ongoing commitment to your health goals. Please review the following plan we discussed and let me know if I can assist you in the future.   These are the goals we discussed:  Goals      Exercise 3x per week (30 min per time)     Try to increase exercise.      Prevent falls     Remain active and independent        This is a list of the screening recommended for you and due dates:  Health Maintenance  Topic Date Due   Zoster (Shingles) Vaccine (1 of 2) Never done   COVID-19 Vaccine (4 - 2023-24 season) 01/13/2022   DEXA scan (bone density measurement)  07/20/2023*   Medicare Annual Wellness Visit  07/20/2023   DTaP/Tdap/Td vaccine (3 - Td or Tdap) 12/07/2027   Pneumonia Vaccine  Completed   Flu Shot  Completed   HPV Vaccine  Aged Out  *Topic was postponed. The date shown is not the original due date.    Advanced directives: Advance directive discussed with you today. I have provided a copy for you to complete at home and have notarized. Once this is complete please bring a copy in to our office so we can scan it into your chart.   Conditions/risks identified: Aim for 30 minutes of exercise or brisk walking, 6-8 glasses of water, and 5 servings of fruits and vegetables each day.   Next appointment: Follow up in one year for your annual wellness visit    Preventive Care 65 Years and Older, Female Preventive care refers to lifestyle choices and visits with your health care provider that can promote health and wellness. What does preventive care include? A yearly physical exam. This is also called an annual well check. Dental exams once or twice a year. Routine eye exams. Ask your health care provider how often you should have your eyes checked. Personal lifestyle choices, including: Daily care of your teeth and gums. Regular physical activity. Eating a healthy diet. Avoiding  tobacco and drug use. Limiting alcohol use. Practicing safe sex. Taking low-dose aspirin every day. Taking vitamin and mineral supplements as recommended by your health care provider. What happens during an annual well check? The services and screenings done by your health care provider during your annual well check will depend on your age, overall health, lifestyle risk factors, and family history of disease. Counseling  Your health care provider may ask you questions about your: Alcohol use. Tobacco use. Drug use. Emotional well-being. Home and relationship well-being. Sexual activity. Eating habits. History of falls. Memory and ability to understand (cognition). Work and work Statistician. Reproductive health. Screening  You may have the following tests or measurements: Height, weight, and BMI. Blood pressure. Lipid and cholesterol levels. These may be checked every 5 years, or more frequently if you are over 80 years old. Skin check. Lung cancer screening. You may have this screening every year starting at age 15 if you have a 30-pack-year history of smoking and currently smoke or have quit within the past 15 years. Fecal occult blood test (FOBT) of the stool. You may have this test every year starting at age 75. Flexible sigmoidoscopy or colonoscopy. You may have a sigmoidoscopy every 5 years or a colonoscopy every 10 years starting at age 63. Hepatitis C blood test. Hepatitis B blood test. Sexually transmitted disease (STD)  testing. Diabetes screening. This is done by checking your blood sugar (glucose) after you have not eaten for a while (fasting). You may have this done every 1-3 years. Bone density scan. This is done to screen for osteoporosis. You may have this done starting at age 85. Mammogram. This may be done every 1-2 years. Talk to your health care provider about how often you should have regular mammograms. Talk with your health care provider about your test  results, treatment options, and if necessary, the need for more tests. Vaccines  Your health care provider may recommend certain vaccines, such as: Influenza vaccine. This is recommended every year. Tetanus, diphtheria, and acellular pertussis (Tdap, Td) vaccine. You may need a Td booster every 10 years. Zoster vaccine. You may need this after age 80. Pneumococcal 13-valent conjugate (PCV13) vaccine. One dose is recommended after age 20. Pneumococcal polysaccharide (PPSV23) vaccine. One dose is recommended after age 59. Talk to your health care provider about which screenings and vaccines you need and how often you need them. This information is not intended to replace advice given to you by your health care provider. Make sure you discuss any questions you have with your health care provider. Document Released: 05/28/2015 Document Revised: 01/19/2016 Document Reviewed: 03/02/2015 Elsevier Interactive Patient Education  2017 [place] Prevention in the Home Falls can cause injuries. They can happen to people of all ages. There are many things you can do to make your home safe and to help prevent falls. What can I do on the outside of my home? Regularly fix the edges of walkways and driveways and fix any cracks. Remove anything that might make you trip as you walk through a door, such as a raised step or threshold. Trim any bushes or trees on the path to your home. Use bright outdoor lighting. Clear any walking paths of anything that might make someone trip, such as rocks or tools. Regularly check to see if handrails are loose or broken. Make sure that both sides of any steps have handrails. Any raised decks and porches should have guardrails on the edges. Have any leaves, snow, or ice cleared regularly. Use sand or salt on walking paths during winter. Clean up any spills in your garage right away. This includes oil or grease spills. What can I do in the bathroom? Use night  lights. Install grab bars by the toilet and in the tub and shower. Do not use towel bars as grab bars. Use non-skid mats or decals in the tub or shower. If you need to sit down in the shower, use a plastic, non-slip stool. Keep the floor dry. Clean up any water that spills on the floor as soon as it happens. Remove soap buildup in the tub or shower regularly. Attach bath mats securely with double-sided non-slip rug tape. Do not have throw rugs and other things on the floor that can make you trip. What can I do in the bedroom? Use night lights. Make sure that you have a light by your bed that is easy to reach. Do not use any sheets or blankets that are too big for your bed. They should not hang down onto the floor. Have a firm chair that has side arms. You can use this for support while you get dressed. Do not have throw rugs and other things on the floor that can make you trip. What can I do in the kitchen? Clean up any spills right away. Avoid walking on wet  floors. Keep items that you use a lot in easy-to-reach places. If you need to reach something above you, use a strong step stool that has a grab bar. Keep electrical cords out of the way. Do not use floor polish or wax that makes floors slippery. If you must use wax, use non-skid floor wax. Do not have throw rugs and other things on the floor that can make you trip. What can I do with my stairs? Do not leave any items on the stairs. Make sure that there are handrails on both sides of the stairs and use them. Fix handrails that are broken or loose. Make sure that handrails are as long as the stairways. Check any carpeting to make sure that it is firmly attached to the stairs. Fix any carpet that is loose or worn. Avoid having throw rugs at the top or bottom of the stairs. If you do have throw rugs, attach them to the floor with carpet tape. Make sure that you have a light switch at the top of the stairs and the bottom of the stairs. If  you do not have them, ask someone to add them for you. What else can I do to help prevent falls? Wear shoes that: Do not have high heels. Have rubber bottoms. Are comfortable and fit you well. Are closed at the toe. Do not wear sandals. If you use a stepladder: Make sure that it is fully opened. Do not climb a closed stepladder. Make sure that both sides of the stepladder are locked into place. Ask someone to hold it for you, if possible. Clearly mark and make sure that you can see: Any grab bars or handrails. First and last steps. Where the edge of each step is. Use tools that help you move around (mobility aids) if they are needed. These include: Canes. Walkers. Scooters. Crutches. Turn on the lights when you go into a dark area. Replace any light bulbs as soon as they burn out. Set up your furniture so you have a clear path. Avoid moving your furniture around. If any of your floors are uneven, fix them. If there are any pets around you, be aware of where they are. Review your medicines with your doctor. Some medicines can make you feel dizzy. This can increase your chance of falling. Ask your doctor what other things that you can do to help prevent falls. This information is not intended to replace advice given to you by your health care provider. Make sure you discuss any questions you have with your health care provider. Document Released: 02/25/2009 Document Revised: 10/07/2015 Document Reviewed: 06/05/2014 Elsevier Interactive Patient Education  2017 [employer].

## 2022-07-26 NOTE — Progress Notes (Signed)
I connected with  [name] on 07/20/2022 by a audio enabled telemedicine application and verified that I am speaking with the correct person using two identifiers.  Patient Location: Home  Provider Location: Office/Clinic  I discussed the limitations of evaluation and management by telemedicine. The patient expressed understanding and agreed to proceed.

## 2022-10-24 ENCOUNTER — Ambulatory Visit (INDEPENDENT_AMBULATORY_CARE_PROVIDER_SITE_OTHER): Payer: Medicare HMO | Admitting: Family Medicine

## 2022-10-24 ENCOUNTER — Encounter: Payer: Self-pay | Admitting: Family Medicine

## 2022-10-24 VITALS — BP 132/70 | HR 69 | Temp 97.9°F | Ht 64.0 in | Wt 233.6 lb

## 2022-10-24 DIAGNOSIS — G629 Polyneuropathy, unspecified: Secondary | ICD-10-CM

## 2022-10-24 NOTE — Progress Notes (Signed)
Subjective:    Patient ID: [name], female    DOB: 02/09/1941, 82 y.o.   MRN: [id number] Patient reports numbness and tingling in both feet.  She states that this is been going on for couple months.  She feels like she is walking on the phone.  She denies any burning or stinging or tingling pain in her feet.  The numbness and paresthesias extend from the soles of her feet up to her mid shins bilaterally.  She has excellent pulses in the dorsalis pedis and posterior tibialis bilaterally.  There is no swelling in the legs.  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     aspirin 81 MG chewable tablet Chew 81 mg by mouth daily.     atorvastatin (LIPITOR) 80 MG tablet Take 1 tablet (80 mg total) by mouth daily. 90 tablet 3   Blood Glucose Calibration (ACCU-CHEK GUIDE CONTROL) LIQD As directed 1 each 0   Blood Glucose Monitoring Suppl (ACCU-CHEK AVIVA PLUS) w/Device KIT USE AS DIRECTED TO  CHECK BLOOD SUGAR 1 kit 0   Blood Glucose Monitoring Suppl (ACCU-CHEK GUIDE) w/Device KIT As directed 1 kit 0   Cholecalciferol (VITAMIN D) 2000 UNITS tablet Take 1,000 Units by mouth daily.      Coenzyme Q10 (CO Q-10) 100 MG CAPS Take by mouth.     diclofenac (VOLTAREN) 75 MG EC tablet Take 1 tablet (75 mg total) by mouth 2 (two) times daily. 30 tablet 0   doxazosin (CARDURA) 1 MG tablet TAKE 1 TABLET EVERY DAY 90 tablet 3   glucose blood (ACCU-CHEK AVIVA PLUS) test strip Use as instructed 100 strip 3   Lancet Devices (ACCU-CHEK SOFTCLIX) lancets Check BS qd - DX- E11.9 1 each 5   losartan-hydrochlorothiazide (HYZAAR) 100-25 MG tablet TAKE 1 TABLET EVERY DAY 90 tablet 3   metoprolol succinate (TOPROL-XL) 50 MG 24 hr tablet TAKE 1 TABLET EVERY DAY WITH OR IMMEDIATELY FOLLOWING A MEAL. 90 tablet 3   Omega-3 Fatty Acids (FISH OIL CONCENTRATE PO) Take by mouth.     Polyethyl Glycol-Propyl Glycol 0.4-0.3 % SOLN Apply to eye.     polyethylene glycol (MIRALAX / GLYCOLAX) packet Take 17 g by mouth daily.  pyridOXINE (VITAMIN B-6) 100 MG tablet Take 100 mg by mouth daily as needed.     triamcinolone cream (KENALOG) 0.1 % Apply 1 Application topically 2 (two) times daily. 30 g 0   No current facility-administered medications on file prior to visit.   Allergies  Allergen Reactions   Vytorin [Ezetimibe-Simvastatin] Other (See Comments)    myalgia   Social History   Socioeconomic History   Marital status: Widowed    Spouse name: Not on file   Number of children: Not on file   Years of education: Not on file   Highest education level: Not on file  Occupational History   Not on file  Tobacco Use   Smoking status: Former    Types: Cigarettes    Quit date: 09/07/1973    Years since quitting: 49.1   Smokeless tobacco: Never  Vaping Use   Vaping Use: Never used  Substance and Sexual Activity   Alcohol use: No   Drug use: No   Sexual activity: Not Currently  Other Topics Concern   Not on  file  Social History Narrative   Not on file   Social Determinants of Health   Financial Resource Strain: Low Risk  (07/20/2022)   Overall Financial Resource Strain (CARDIA)    Difficulty of Paying Living Expenses: Not hard at all  Food Insecurity: No Food Insecurity (07/20/2022)   Hunger Vital Sign    Worried About Running Out of Food in the Last Year: Never true    Ran Out of Food in the Last Year: Never true  Transportation Needs: No Transportation Needs (07/20/2022)   PRAPARE - Administrator, Civil Service (Medical): No    Lack of Transportation (Non-Medical): No  Physical Activity: Insufficiently Active (07/20/2022)   Exercise Vital Sign    Days of Exercise per Week: 3 days    Minutes of Exercise per Session: 30 min  Stress: No Stress Concern Present (07/20/2022)   [employer] of Occupational Health - Occupational Stress Questionnaire    Feeling of Stress : Not at all  Social Connections: Moderately Integrated (07/20/2022)   Social Connection and Isolation Panel [NHANES]    Frequency of Communication with Friends and Family: More than three times a week    Frequency of Social Gatherings with Friends and Family: Twice a week    Attends Religious Services: 1 to 4 times per year    Active Member of [employer] or Organizations: Yes    Attends Banker Meetings: 1 to 4 times per year    Marital Status: Widowed  Intimate Partner Violence: Not At Risk (07/20/2022)   Humiliation, Afraid, Rape, and Kick questionnaire    Fear of Current or Ex-Partner: No    Emotionally Abused: No    Physically Abused: No    Sexually Abused: No    Family History  Problem Relation Age of Onset   Stroke Paternal Grandmother    Diabetes Mother    Diabetes Sister    Diabetes Grandchild      Review of Systems  All other systems reviewed and are negative.      Objective:   Physical Exam Vitals reviewed.  Constitutional:      General: She is not in acute distress.     Appearance: Normal appearance. She is well-developed. She is obese. She is not ill-appearing, toxic-appearing or diaphoretic.  HENT:     Head: Normocephalic and atraumatic.  Cardiovascular:     Rate and Rhythm: Normal rate and  regular rhythm.     Pulses: Normal pulses.          Dorsalis pedis pulses are 2+ on the right side and 2+ on the left side.       Posterior tibial pulses are 2+ on the right side and 2+ on the left side.     Heart sounds: Murmur heard.     No friction rub. No gallop.  Pulmonary:     Effort: Pulmonary effort is normal. No respiratory distress.     Breath sounds: Normal breath sounds. No stridor. No wheezing, rhonchi or rales.  Chest:     Chest wall: No tenderness.  Abdominal:     General: Bowel sounds are normal.  Musculoskeletal:        General: No swelling or deformity.       Arms:     Right lower leg: No edema.     Left lower leg: No edema.     Right foot: Normal. Normal range of motion. No tenderness or bony tenderness.     Left foot: Normal. Normal range of motion. No tenderness or bony tenderness.  Neurological:     Mental Status: She is alert.           Assessment & Plan:  Neuropathy - Plan: Vitamin B12, Hemoglobin A1c, TSH, COMPLETE METABOLIC PANEL WITH GFR, CBC with Differential/Platelet Patient is developing peripheral neuropathy.  She denies any back pain or symptoms of lumbar radiculopathy.  Evaluate for B12 deficiency, thyroid abnormalities, and type 2 diabetes with lab work.  If labs are normal no treatment is necessary at this time as she is not having any neuropathic pain.

## 2022-10-25 LAB — COMPLETE METABOLIC PANEL WITH GFR
AG Ratio: 1.6 (calc) (ref 1.0–2.5)
ALT: 45 U/L — ABNORMAL HIGH (ref 6–29)
AST: 36 U/L — ABNORMAL HIGH (ref 10–35)
Albumin: 4.6 g/dL (ref 3.6–5.1)
Alkaline phosphatase (APISO): 58 U/L (ref 37–153)
BUN: 21 mg/dL (ref 7–25)
CO2: 27 mmol/L (ref 20–32)
Calcium: 10.2 mg/dL (ref 8.6–10.4)
Chloride: 104 mmol/L (ref 98–110)
Creat: 0.84 mg/dL (ref 0.60–0.95)
Globulin: 2.9 g/dL (calc) (ref 1.9–3.7)
Glucose, Bld: 93 mg/dL (ref 65–99)
Potassium: 4 mmol/L (ref 3.5–5.3)
Sodium: 143 mmol/L (ref 135–146)
Total Bilirubin: 0.4 mg/dL (ref 0.2–1.2)
Total Protein: 7.5 g/dL (ref 6.1–8.1)
eGFR: 70 mL/min/{1.73_m2} (ref >=60)

## 2022-10-25 LAB — CBC WITH DIFFERENTIAL/PLATELET
Absolute Monocytes: 622 cells/uL (ref 200–950)
Basophils Absolute: 50 cells/uL (ref 0–200)
Basophils Relative: 0.6 %
Eosinophils Absolute: 168 cells/uL (ref 15–500)
Eosinophils Relative: 2 %
HCT: 38.8 % (ref 35.0–45.0)
Hemoglobin: 12.9 g/dL (ref 11.7–15.5)
Lymphs Abs: 3142 cells/uL (ref 850–3900)
MCH: 27 pg (ref 27.0–33.0)
MCHC: 33.2 g/dL (ref 32.0–36.0)
MCV: 81.3 fL (ref 80.0–100.0)
MPV: 10.4 fL (ref 7.5–12.5)
Monocytes Relative: 7.4 %
Neutro Abs: 4418 cells/uL (ref 1500–7800)
Neutrophils Relative %: 52.6 %
Platelets: 265 10*3/uL (ref 140–400)
RBC: 4.77 10*6/uL (ref 3.80–5.10)
RDW: 14.3 % (ref 11.0–15.0)
Total Lymphocyte: 37.4 %
WBC: 8.4 10*3/uL (ref 3.8–10.8)

## 2022-10-25 LAB — VITAMIN B12: Vitamin B-12: 292 pg/mL (ref 200–1100)

## 2022-10-25 LAB — TSH: TSH: 4.06 mIU/L (ref 0.40–4.50)

## 2022-10-25 LAB — HEMOGLOBIN A1C
Hgb A1c MFr Bld: 5.8 % of total Hgb — ABNORMAL HIGH (ref <5.7)
Mean Plasma Glucose: 120 mg/dL
eAG (mmol/L): 6.6 mmol/L

## 2022-10-26 ENCOUNTER — Other Ambulatory Visit: Payer: Self-pay | Admitting: Family Medicine

## 2022-10-26 DIAGNOSIS — R7989 Other specified abnormal findings of blood chemistry: Secondary | ICD-10-CM

## 2022-11-01 ENCOUNTER — Other Ambulatory Visit: Payer: Self-pay | Admitting: Family Medicine

## 2022-11-01 DIAGNOSIS — H524 Presbyopia: Secondary | ICD-10-CM | POA: Diagnosis not present

## 2022-11-01 DIAGNOSIS — H04123 Dry eye syndrome of bilateral lacrimal glands: Secondary | ICD-10-CM | POA: Diagnosis not present

## 2022-11-01 DIAGNOSIS — H5203 Hypermetropia, bilateral: Secondary | ICD-10-CM | POA: Diagnosis not present

## 2022-11-01 DIAGNOSIS — H2513 Age-related nuclear cataract, bilateral: Secondary | ICD-10-CM | POA: Diagnosis not present

## 2022-11-01 DIAGNOSIS — Z961 Presence of intraocular lens: Secondary | ICD-10-CM | POA: Diagnosis not present

## 2022-11-01 DIAGNOSIS — H52223 Regular astigmatism, bilateral: Secondary | ICD-10-CM | POA: Diagnosis not present

## 2023-02-15 ENCOUNTER — Encounter: Payer: Self-pay | Admitting: Family Medicine

## 2023-02-15 ENCOUNTER — Ambulatory Visit: Payer: Medicare HMO | Admitting: Family Medicine

## 2023-02-15 VITALS — BP 132/70 | HR 67 | Temp 98.4°F | Ht 64.0 in | Wt 231.4 lb

## 2023-02-15 DIAGNOSIS — K219 Gastro-esophageal reflux disease without esophagitis: Secondary | ICD-10-CM | POA: Diagnosis not present

## 2023-02-15 DIAGNOSIS — Z23 Encounter for immunization: Secondary | ICD-10-CM

## 2023-02-15 MED ORDER — PANTOPRAZOLE SODIUM 40 MG PO TBEC: 40.0000 mg | DELAYED_RELEASE_TABLET | Freq: Every day | ORAL | 3 refills | Status: AC

## 2023-02-15 NOTE — Addendum Note (Signed)
Addended by: Venia Carbon K on: 02/15/2023 08:45 AM   Modules accepted: Orders

## 2023-02-15 NOTE — Progress Notes (Signed)
Subjective:    Patient ID: [name], female    DOB: [birth year], 82 y.o.   MRN: [id number]  Patient reports 2 weeks of burping and gas in the center of the chest.  It occurs primarily at night when she is laying down.  She feels a pressure-like sensation in her chest.  When she burps, it relieves the pressure and she feels better.  She is also been having more acid and indigestion.  Today symptoms primarily occur at night.  She also reports pain in the left first MCP joint.  She does handgrip exercises every day build up the strength in her wrist.  However this seems to be aggravating it.  The tenderness is in the left first MCP joint.  There is no erythema or swelling.  There is no instability. 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     aspirin 81 MG chewable tablet Chew 81 mg by mouth daily.     atorvastatin (LIPITOR) 80 MG tablet TAKE 1 TABLET EVERY DAY 90 tablet 3   Blood Glucose Calibration (ACCU-CHEK GUIDE CONTROL) LIQD As  directed 1 each 0   Blood Glucose Monitoring Suppl (ACCU-CHEK AVIVA PLUS) w/Device KIT USE AS DIRECTED TO CHECK BLOOD SUGAR 1 kit 0   Blood Glucose Monitoring Suppl (ACCU-CHEK GUIDE) w/Device KIT As directed 1 kit 0   Cholecalciferol (VITAMIN D) 2000 UNITS tablet Take 1,000 Units by mouth daily.      Coenzyme Q10 (CO Q-10) 100 MG CAPS Take by mouth.     diclofenac (VOLTAREN) 75 MG EC tablet Take 1 tablet (75 mg total) by mouth 2 (two) times daily. 30 tablet 0   doxazosin (CARDURA) 1 MG tablet TAKE 1 TABLET EVERY DAY 90 tablet 3   glucose blood (ACCU-CHEK AVIVA PLUS) test strip Use as instructed 100 strip 3   Lancet Devices (ACCU-CHEK SOFTCLIX) lancets Check BS qd - DX- E11.9 1 each 5   losartan-hydrochlorothiazide (HYZAAR) 100-25 MG tablet TAKE 1 TABLET EVERY DAY 90 tablet 3   metoprolol succinate (TOPROL-XL) 50 MG 24 hr tablet TAKE 1 TABLET EVERY DAY WITH OR IMMEDIATELY FOLLOWING A MEAL. 90 tablet 3   Omega-3 Fatty Acids (FISH OIL CONCENTRATE PO) Take by mouth.     Polyethyl Glycol-Propyl Glycol 0.4-0.3 %  SOLN Apply to eye.     polyethylene glycol (MIRALAX / GLYCOLAX) packet Take 17 g by mouth daily.     pyridOXINE (VITAMIN B-6) 100 MG tablet Take 100 mg by mouth daily as needed.     triamcinolone cream (KENALOG) 0.1 % Apply 1 Application topically 2 (two) times daily. 30 g 0   No current facility-administered medications on file prior to visit.   Allergies  Allergen Reactions   Vytorin [Ezetimibe-Simvastatin] Other (See Comments)    myalgia   Social History   Socioeconomic History   Marital status: Widowed    Spouse name: Not on file   Number of children: Not on file   Years of education: Not on file   Highest education level: Not on file  Occupational History   Not on file  Tobacco Use   Smoking status: Former    Current packs/day: 0.00    Types: Cigarettes    Quit date: 09/07/1973    Years since quitting: 49.4   Smokeless tobacco: Never  Vaping Use   Vaping status: Never  Used  Substance and Sexual Activity   Alcohol use: No   Drug use: No   Sexual activity: Not Currently  Other Topics Concern   Not on file  Social History Narrative   Not on file   Social Determinants of Health   Financial Resource Strain: Low Risk  (07/20/2022)   Overall Financial Resource Strain (CARDIA)    Difficulty of Paying Living Expenses: Not hard at all  Food Insecurity: No Food Insecurity (07/20/2022)   Hunger Vital Sign    Worried About Running Out of Food in the Last Year: Never true    Ran Out of Food in the Last Year: Never true  Transportation Needs: No Transportation Needs (07/20/2022)   PRAPARE - Administrator, Civil Service (Medical): No    Lack of Transportation (Non-Medical): No  Physical Activity: Insufficiently Active (07/20/2022)   Exercise Vital Sign    Days of Exercise per Week: 3 days    Minutes of Exercise per Session: 30 min  Stress: No Stress Concern Present (07/20/2022)   [employer] of Occupational Health - Occupational Stress Questionnaire    Feeling of Stress : Not at all  Social Connections: Moderately Integrated (07/20/2022)   Social Connection and Isolation Panel [NHANES]    Frequency of Communication with Friends and Family: More than three times a week    Frequency of Social Gatherings with Friends and Family: Twice a week    Attends Religious Services: 1 to 4 times per year    Active Member of [employer] or Organizations: Yes    Attends Banker Meetings: 1 to 4 times per year    Marital Status: Widowed  Intimate Partner Violence: Not At Risk (07/20/2022)   Humiliation, Afraid, Rape, and Kick questionnaire    Fear of Current or Ex-Partner: No    Emotionally Abused: No    Physically Abused: No    Sexually Abused: No    Family History  Problem Relation Age of Onset   Stroke Paternal Grandmother    Diabetes Mother    Diabetes Sister    Diabetes Grandchild      Review of Systems  All other systems reviewed and are  negative.      Objective:   Physical Exam Vitals reviewed.  Constitutional:      General: She is not in acute distress.    Appearance: Normal appearance. She is well-developed. She is  obese. She is not ill-appearing, toxic-appearing or diaphoretic.  HENT:     Head: Normocephalic and atraumatic.  Cardiovascular:     Rate and Rhythm: Normal rate and regular rhythm.     Pulses: Normal pulses.     Heart sounds: Murmur heard.     No friction rub. No gallop.  Pulmonary:     Effort: Pulmonary effort is normal. No respiratory distress.     Breath sounds: Normal breath sounds. No stridor. No wheezing, rhonchi or rales.  Chest:     Chest wall: No tenderness.  Abdominal:     General: Abdomen is flat. Bowel sounds are normal. There is no distension.     Palpations: Abdomen is soft. There is no mass.     Tenderness: There is no abdominal tenderness. There is no guarding or rebound.  Musculoskeletal:     Right lower leg: No edema.     Left lower leg: No edema.     Right foot: Normal. Normal range of motion. No tenderness or bony tenderness.     Left foot: Normal. Normal range of motion. No tenderness or bony tenderness.  Neurological:     Mental Status: She is alert.           Assessment & Plan:  Gastroesophageal reflux disease, unspecified whether esophagitis present I recommended elevating the head of her bed 2 inches to help prevent acid reflux at night.  Also begin Protonix 40 mg daily.  If in 1 month, her symptoms are better, she can stop Protonix.  I believe she is getting overuse irritation to the left first MCP joint.  Recommended discontinuation of the exercises and applying Voltaren gel 3-4 times a day.  She received her flu shot today.

## 2023-03-07 ENCOUNTER — Other Ambulatory Visit: Payer: Self-pay | Admitting: Family Medicine

## 2023-03-08 NOTE — Telephone Encounter (Signed)
Due to a system glitch the last office visit for this practice is not detected correctly.    LOV 02/15/2023.   All labs in date.

## 2023-03-20 ENCOUNTER — Ambulatory Visit: Payer: Medicare HMO | Admitting: Cardiology

## 2023-03-29 ENCOUNTER — Encounter: Payer: Self-pay | Admitting: Cardiology

## 2023-03-29 ENCOUNTER — Ambulatory Visit: Payer: Medicare HMO | Attending: Cardiology | Admitting: Cardiology

## 2023-03-29 VITALS — BP 154/72 | HR 72 | Ht 64.0 in | Wt 231.6 lb

## 2023-03-29 DIAGNOSIS — I10 Essential (primary) hypertension: Secondary | ICD-10-CM

## 2023-03-29 DIAGNOSIS — I700 Atherosclerosis of aorta: Secondary | ICD-10-CM | POA: Diagnosis not present

## 2023-03-29 DIAGNOSIS — R0789 Other chest pain: Secondary | ICD-10-CM | POA: Diagnosis not present

## 2023-03-29 DIAGNOSIS — E782 Mixed hyperlipidemia: Secondary | ICD-10-CM

## 2023-03-29 NOTE — Progress Notes (Signed)
Cardiology Office Note:  .   Date:  03/29/2023  ID:  Michiel Sites, DOB July 15, 1940, MRN [id number] PCP: Donita Brooks, MD  [facility] Providers Cardiologist:  Donato Schultz, MD     History of Present Illness: .   [name] is a 82 y.o. female Discussed with the use of AI scribe software  History of Present Illness   The patient, an 82 year old with a history of aortic atherosclerosis, hyperlipidemia, hypertension, and atypical chest pain, presents for a follow-up visit. The patient's LDL was previously elevated at 108, prompting an increase in atorvastatin to 80mg  last year. The patient reports that blood pressure has been reasonably controlled at home, although there is a noted history of white coat hypertension.  The patient's most recent echocardiogram in March 2022 showed an ejection fraction of 70% with mild LVH and a small pericardial effusion, but no tamponade. A CT scan of the abdomen demonstrated aortic atherosclerosis, leading to the intensification of treatment.  The patient reports feeling generally well, with no major chest pain. However, she describes a sensation of needing to burp, particularly in the morning, which provides relief once accomplished. This symptom has been previously attributed to heartburn or acid reflux, and the patient has been advised to elevate the head of the bed to alleviate symptoms.  The patient has a long history of physical activity, having worked for 48 years in a job that required significant movement at [employer] Despite retirement, the patient continues to try to incorporate exercise into her daily routine. The patient also has a history of knee surgery and carpal tunnel syndrome, which have affected her ability to continue certain activities.           Studies Reviewed: Marland Kitchen   EKG Interpretation Date/Time:  Thursday March 29 2023 11:22:15 EST Ventricular Rate:  72 PR Interval:  186 QRS  Duration:  88 QT Interval:  374 QTC Calculation: 409 R Axis:   23  Text Interpretation: Normal sinus rhythm Nonspecific ST and T wave abnormality When compared with ECG of 29-Mar-2023 11:22, No significant change was found Confirmed by Donato Schultz ([id number]) on 03/29/2023 11:25:17 AM    Results LABS LDL: 108 Blood glucose: 107  RADIOLOGY CT scan of the abdomen: Aortic atherosclerosis  DIAGNOSTIC Echocardiogram: Ejection fraction 70%, mild LVH, small pericardial effusion, no tamponade (07/14/2020) EKG: Normal  Risk Assessment/Calculations:           Physical Exam:   VS:  BP (!) 154/72   Pulse 72   Ht 5\' 4"  (1.626 m)   Wt 231 lb 9.6 oz (105.1 kg)   SpO2 96%   BMI 39.75 kg/m    Wt Readings from Last 3 Encounters:  03/29/23 231 lb 9.6 oz (105.1 kg)  02/15/23 231 lb 6.4 oz (105 kg)  10/24/22 233 lb 9.6 oz (106 kg)    GEN: Well nourished, well developed in no acute distress NECK: No JVD; No carotid bruits CARDIAC: RRR, no murmurs, no rubs, no gallops RESPIRATORY:  Clear to auscultation without rales, wheezing or rhonchi  ABDOMEN: Soft, non-tender, non-distended EXTREMITIES:  No edema; No deformity   ASSESSMENT AND PLAN: .    Assessment and Plan    Atypical Chest Pain Intermittent chest pain relieved by burping, likely related to acid reflux. No major chest pain recently. - Elevate head of bed to reduce reflux symptoms  Aortic Atherosclerosis Chronic condition identified via prior CT scan. No new symptoms reported. - Continue current management  plan  Hyperlipidemia Previously elevated LDL at 108 mg/dL. Atorvastatin increased to 80 mg last year. Recent LDL improved to 94 mg/dL as of December 8119. Patient tolerating medication well. - Continue atorvastatin 80 mg daily  Hypertension Blood pressure well-controlled at home with occasional white coat hypertension. Current reading in office was 133/77 mmHg. - Continue current antihypertensive regimen  General Health  Maintenance Patient maintaining an active lifestyle with regular exercise. - Encourage continued physical activity  Follow-up - Schedule follow-up appointment in one year.              Signed, Donato Schultz, MD

## 2023-03-29 NOTE — Patient Instructions (Signed)

## 2023-06-11 ENCOUNTER — Other Ambulatory Visit: Payer: Self-pay | Admitting: Family Medicine

## 2023-06-22 ENCOUNTER — Ambulatory Visit (INDEPENDENT_AMBULATORY_CARE_PROVIDER_SITE_OTHER): Payer: Medicare HMO | Admitting: Family Medicine

## 2023-06-22 ENCOUNTER — Encounter: Payer: Self-pay | Admitting: Family Medicine

## 2023-06-22 VITALS — BP 140/82 | HR 75 | Temp 97.8°F | Ht 64.0 in | Wt 228.0 lb

## 2023-06-22 DIAGNOSIS — G629 Polyneuropathy, unspecified: Secondary | ICD-10-CM

## 2023-06-22 DIAGNOSIS — M25561 Pain in right knee: Secondary | ICD-10-CM

## 2023-06-22 DIAGNOSIS — E7800 Pure hypercholesterolemia, unspecified: Secondary | ICD-10-CM | POA: Diagnosis not present

## 2023-06-22 DIAGNOSIS — G8929 Other chronic pain: Secondary | ICD-10-CM | POA: Diagnosis not present

## 2023-06-22 DIAGNOSIS — M25562 Pain in left knee: Secondary | ICD-10-CM | POA: Diagnosis not present

## 2023-06-22 NOTE — Progress Notes (Signed)
 Subjective:    Patient ID: [name], female    DOB: Jan 21, 1941, 83 y.o.   MRN: [id number] Patient reports numbness and tingling in both feet.  She states that this is been going on for couple months.  She feels like she is walking on foam.  She does report occasional throbbing pain in the plantar surfaces of both feet.  She also complains of bilateral knee pain.  The pain is worse in her right knee.  She reports pain with standing and pain with walking.  The pain is located over the medial joint line primarily but also the lateral joint line. 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     aspirin 81 MG chewable tablet Chew 81 mg by mouth daily.     atorvastatin  (LIPITOR) 80 MG tablet TAKE 1 TABLET EVERY DAY 90 tablet 3   Blood Glucose Calibration (ACCU-CHEK GUIDE CONTROL) LIQD As directed 1 each 0   Blood Glucose Monitoring Suppl (ACCU-CHEK AVIVA PLUS) w/Device KIT USE AS DIRECTED TO CHECK BLOOD SUGAR 1 kit 0   Blood Glucose  Monitoring Suppl (ACCU-CHEK GUIDE) w/Device KIT As directed 1 kit 0   Cholecalciferol (VITAMIN D) 2000 UNITS tablet Take 1,000 Units by mouth daily.      Coenzyme Q10 (CO Q-10) 100 MG CAPS Take by mouth.     diclofenac  (VOLTAREN ) 75 MG EC tablet Take 1 tablet (75 mg total) by mouth 2 (two) times daily. 30 tablet 0   glucose blood (ACCU-CHEK AVIVA PLUS) test strip Use as instructed 100 strip 3   Lancet Devices (ACCU-CHEK SOFTCLIX) lancets Check BS qd - DX- E11.9 1 each 5   losartan -hydrochlorothiazide  (HYZAAR) 100-25 MG tablet TAKE 1 TABLET EVERY DAY 90 tablet 3   metoprolol  succinate (TOPROL -XL) 50 MG 24 hr tablet TAKE 1 TABLET EVERY DAY WITH OR IMMEDIATELY FOLLOWING A MEAL. 90 tablet 3   Omega-3 Fatty Acids (FISH OIL CONCENTRATE PO) Take by mouth.     pantoprazole  (PROTONIX ) 40 MG tablet Take 1 tablet (40 mg total) by mouth daily. 30 tablet 3   Polyethyl Glycol-Propyl Glycol 0.4-0.3 % SOLN Apply to eye.     polyethylene glycol (MIRALAX / GLYCOLAX) packet Take 17 g by mouth daily.     pyridOXINE (VITAMIN B-6) 100  MG tablet Take 100 mg by mouth daily as needed.     triamcinolone  cream (KENALOG ) 0.1 % Apply 1 Application topically 2 (two) times daily. 30 g 0   doxazosin  (CARDURA ) 1 MG tablet TAKE 1 TABLET EVERY DAY (Patient not taking: Reported on 06/22/2023) 90 tablet 3   No current facility-administered medications on file prior to visit.   Allergies  Allergen Reactions   Vytorin [Ezetimibe-Simvastatin] Other (See Comments)    myalgia   Social History   Socioeconomic History   Marital status: Widowed    Spouse name: Not on file   Number of children: Not on file   Years of education: Not on file   Highest education level: Not on file  Occupational History   Not on file  Tobacco Use   Smoking status: Former    Current packs/day: 0.00    Types: Cigarettes    Quit date: 09/07/1973    Years since quitting: 49.8   Smokeless tobacco: Never  Vaping Use   Vaping status: Never Used   Substance and Sexual Activity   Alcohol use: No   Drug use: No   Sexual activity: Not Currently  Other Topics Concern   Not on file  Social History Narrative   Not on file   Social Drivers of Health   Financial Resource Strain: Low Risk  (07/20/2022)   Overall Financial Resource Strain (CARDIA)    Difficulty of Paying Living Expenses: Not hard at all  Food Insecurity: No Food Insecurity (07/20/2022)   Hunger Vital Sign    Worried About Running Out of Food in the Last Year: Never true    Ran Out of Food in the Last Year: Never true  Transportation Needs: No Transportation Needs (07/20/2022)   PRAPARE - Administrator, Civil Service (Medical): No    Lack of Transportation (Non-Medical): No  Physical Activity: Insufficiently Active (07/20/2022)   Exercise Vital Sign    Days of Exercise per Week: 3 days    Minutes of Exercise per Session: 30 min  Stress: No Stress Concern Present (07/20/2022)   [employer] of Occupational Health - Occupational Stress Questionnaire    Feeling of Stress : Not at all  Social Connections: Moderately Integrated (07/20/2022)   Social Connection and Isolation Panel [NHANES]    Frequency of Communication with Friends and Family: More than three times a week    Frequency of Social Gatherings with Friends and Family: Twice a week    Attends Religious Services: 1 to 4 times per year    Active Member of [employer] or Organizations: Yes    Attends Banker Meetings: 1 to 4 times per year    Marital Status: Widowed  Intimate Partner Violence: Not At Risk (07/20/2022)   Humiliation, Afraid, Rape, and Kick questionnaire    Fear of Current or Ex-Partner: No    Emotionally Abused: No    Physically Abused: No    Sexually Abused: No    Family History  Problem Relation Age of Onset   Stroke Paternal Grandmother    Diabetes Mother    Diabetes Sister    Diabetes Grandchild      Review of Systems  All other systems reviewed and are  negative.      Objective:   Physical Exam Vitals reviewed.  Constitutional:      General: She is not in acute distress.    Appearance: Normal appearance. She is well-developed. She is obese. She is not ill-appearing, toxic-appearing or  diaphoretic.  HENT:     Head: Normocephalic and atraumatic.  Cardiovascular:     Rate and Rhythm: Normal rate and regular rhythm.     Pulses: Normal pulses.          Dorsalis pedis pulses are 2+ on the right side and 2+ on the left side.       Posterior tibial pulses are 2+ on the right side and 2+ on the left side.     Heart sounds: Murmur heard.     No friction rub. No gallop.  Pulmonary:     Effort: Pulmonary effort is normal. No respiratory distress.     Breath sounds: Normal breath sounds. No stridor. No wheezing, rhonchi or rales.  Chest:     Chest wall: No tenderness.  Abdominal:     General: Bowel sounds are normal.  Musculoskeletal:        General: No swelling or deformity.     Right knee: Decreased range of motion. Tenderness present over the medial joint line.     Left knee: Decreased range of motion. Tenderness present.     Right lower leg: No edema.     Left lower leg: No edema.     Right foot: Normal. Normal range of motion. No tenderness or bony tenderness.     Left foot: Normal. Normal range of motion. No tenderness or bony tenderness.  Neurological:     Mental Status: She is alert.           Assessment & Plan:  Pure hypercholesterolemia - Plan: CBC with Differential/Platelet, COMPLETE METABOLIC PANEL WITH GFR, Lipid panel  Neuropathy  Chronic pain of both knees I believe the patient is dealing with neuropathy.  We discussed adding additional medication to treat pain from neuropathy but at the present time she declines this.  I believe the pain in her knees is osteoarthritis.  At the present time, she is comfortable wearing a knee brace to treat the pain.  We discussed a cortisone injection but she politely declines this  today.  We discussed a referral to orthopedics but she declines this today

## 2023-06-23 LAB — COMPLETE METABOLIC PANEL WITH GFR
AG Ratio: 1.3 (calc) (ref 1.0–2.5)
ALT: 38 U/L — ABNORMAL HIGH (ref 6–29)
AST: 26 U/L (ref 10–35)
Albumin: 4.5 g/dL (ref 3.6–5.1)
Alkaline phosphatase (APISO): 54 U/L (ref 37–153)
BUN: 15 mg/dL (ref 7–25)
CO2: 29 mmol/L (ref 20–32)
Calcium: 10.4 mg/dL (ref 8.6–10.4)
Chloride: 104 mmol/L (ref 98–110)
Creat: 0.86 mg/dL (ref 0.60–0.95)
Globulin: 3.5 g/dL (ref 1.9–3.7)
Glucose, Bld: 94 mg/dL (ref 65–99)
Potassium: 3.8 mmol/L (ref 3.5–5.3)
Sodium: 142 mmol/L (ref 135–146)
Total Bilirubin: 0.5 mg/dL (ref 0.2–1.2)
Total Protein: 8 g/dL (ref 6.1–8.1)
eGFR: 68 mL/min/{1.73_m2} (ref >=60)

## 2023-06-23 LAB — LIPID PANEL
Cholesterol: 184 mg/dL (ref <200)
HDL: 61 mg/dL (ref >=50)
LDL Cholesterol (Calc): 105 mg/dL — ABNORMAL HIGH
Non-HDL Cholesterol (Calc): 123 mg/dL (ref <130)
Total CHOL/HDL Ratio: 3 (calc) (ref <5.0)
Triglycerides: 86 mg/dL (ref <150)

## 2023-06-23 LAB — CBC WITH DIFFERENTIAL/PLATELET
Absolute Lymphocytes: 3050 {cells}/uL (ref 850–3900)
Absolute Monocytes: 531 {cells}/uL (ref 200–950)
Basophils Absolute: 21 {cells}/uL (ref 0–200)
Basophils Relative: 0.3 %
Eosinophils Absolute: 159 {cells}/uL (ref 15–500)
Eosinophils Relative: 2.3 %
HCT: 40.9 % (ref 35.0–45.0)
Hemoglobin: 13.3 g/dL (ref 11.7–15.5)
MCH: 27 pg (ref 27.0–33.0)
MCHC: 32.5 g/dL (ref 32.0–36.0)
MCV: 83 fL (ref 80.0–100.0)
MPV: 10.5 fL (ref 7.5–12.5)
Monocytes Relative: 7.7 %
Neutro Abs: 3140 {cells}/uL (ref 1500–7800)
Neutrophils Relative %: 45.5 %
Platelets: 276 10*3/uL (ref 140–400)
RBC: 4.93 10*6/uL (ref 3.80–5.10)
RDW: 14.3 % (ref 11.0–15.0)
Total Lymphocyte: 44.2 %
WBC: 6.9 10*3/uL (ref 3.8–10.8)

## 2023-08-08 ENCOUNTER — Encounter

## 2023-08-16 ENCOUNTER — Ambulatory Visit: Admitting: *Deleted

## 2023-08-16 DIAGNOSIS — Z0000 Encounter for general adult medical examination without abnormal findings: Secondary | ICD-10-CM | POA: Diagnosis not present

## 2023-08-16 NOTE — Patient Instructions (Signed)
 [name] , Thank you for taking time to come for your Medicare Wellness Visit. I appreciate your ongoing commitment to your health goals. Please review the following plan we discussed and let me know if I can assist you in the future.   Screening recommendations/referrals: Colonoscopy: no longer required Mammogram: no longer required Bone Density: Education provided Recommended yearly ophthalmology/optometry visit for glaucoma screening and checkup Recommended yearly dental visit for hygiene and checkup  Vaccinations: Influenza vaccine:  Pneumococcal vaccine:  Tdap vaccine:  Shingles vaccine:        Preventive Care 65 Years and Older, Female Preventive care refers to lifestyle choices and visits with your health care provider that can promote health and wellness. What does preventive care include? A yearly physical exam. This is also called an annual well check. Dental exams once or twice a year. Routine eye exams. Ask your health care provider how often you should have your eyes checked. Personal lifestyle choices, including: Daily care of your teeth and gums. Regular physical activity. Eating a healthy diet. Avoiding tobacco and drug use. Limiting alcohol use. Practicing safe sex. Taking low-dose aspirin every day. Taking vitamin and mineral supplements as recommended by your health care provider. What happens during an annual well check? The services and screenings done by your health care provider during your annual well check will depend on your age, overall health, lifestyle risk factors, and family history of disease. Counseling  Your health care provider may ask you questions about your: Alcohol use. Tobacco use. Drug use. Emotional well-being. Home and relationship well-being. Sexual activity. Eating habits. History of falls. Memory and ability to understand (cognition). Work and work Astronomer. Reproductive health. Screening  You may have the following  tests or measurements: Height, weight, and BMI. Blood pressure. Lipid and cholesterol levels. These may be checked every 5 years, or more frequently if you are over 18 years old. Skin check. Lung cancer screening. You may have this screening every year starting at age 33 if you have a 30-pack-year history of smoking and currently smoke or have quit within the past 15 years. Fecal occult blood test (FOBT) of the stool. You may have this test every year starting at age 42. Flexible sigmoidoscopy or colonoscopy. You may have a sigmoidoscopy every 5 years or a colonoscopy every 10 years starting at age 67. Hepatitis C blood test. Hepatitis B blood test. Sexually transmitted disease (STD) testing. Diabetes screening. This is done by checking your blood sugar (glucose) after you have not eaten for a while (fasting). You may have this done every 1-3 years. Bone density scan. This is done to screen for osteoporosis. You may have this done starting at age 21. Mammogram. This may be done every 1-2 years. Talk to your health care provider about how often you should have regular mammograms. Talk with your health care provider about your test results, treatment options, and if necessary, the need for more tests. Vaccines  Your health care provider may recommend certain vaccines, such as: Influenza vaccine. This is recommended every year. Tetanus, diphtheria, and acellular pertussis (Tdap, Td) vaccine. You may need a Td booster every 10 years. Zoster vaccine. You may need this after age 69. Pneumococcal 13-valent conjugate (PCV13) vaccine. One dose is recommended after age 62. Pneumococcal polysaccharide (PPSV23) vaccine. One dose is recommended after age 83. Talk to your health care provider about which screenings and vaccines you need and how often you need them. This information is not intended to replace advice given  to you by your health care provider. Make sure you discuss any questions you have with  your health care provider. Document Released: 05/28/2015 Document Revised: 01/19/2016 Document Reviewed: 03/02/2015 Elsevier Interactive Patient Education  2017 [employer].  Fall Prevention in the Home Falls can cause injuries. They can happen to people of all ages. There are many things you can do to make your home safe and to help prevent falls. What can I do on the outside of my home? Regularly fix the edges of walkways and driveways and fix any cracks. Remove anything that might make you trip as you walk through a door, such as a raised step or threshold. Trim any bushes or trees on the path to your home. Use bright outdoor lighting. Clear any walking paths of anything that might make someone trip, such as rocks or tools. Regularly check to see if handrails are loose or broken. Make sure that both sides of any steps have handrails. Any raised decks and porches should have guardrails on the edges. Have any leaves, snow, or ice cleared regularly. Use sand or salt on walking paths during winter. Clean up any spills in your garage right away. This includes oil or grease spills. What can I do in the bathroom? Use night lights. Install grab bars by the toilet and in the tub and shower. Do not use towel bars as grab bars. Use non-skid mats or decals in the tub or shower. If you need to sit down in the shower, use a plastic, non-slip stool. Keep the floor dry. Clean up any water that spills on the floor as soon as it happens. Remove soap buildup in the tub or shower regularly. Attach bath mats securely with double-sided non-slip rug tape. Do not have throw rugs and other things on the floor that can make you trip. What can I do in the bedroom? Use night lights. Make sure that you have a light by your bed that is easy to reach. Do not use any sheets or blankets that are too big for your bed. They should not hang down onto the floor. Have a firm chair that has side arms. You can use this  for support while you get dressed. Do not have throw rugs and other things on the floor that can make you trip. What can I do in the kitchen? Clean up any spills right away. Avoid walking on wet floors. Keep items that you use a lot in easy-to-reach places. If you need to reach something above you, use a strong step stool that has a grab bar. Keep electrical cords out of the way. Do not use floor polish or wax that makes floors slippery. If you must use wax, use non-skid floor wax. Do not have throw rugs and other things on the floor that can make you trip. What can I do with my stairs? Do not leave any items on the stairs. Make sure that there are handrails on both sides of the stairs and use them. Fix handrails that are broken or loose. Make sure that handrails are as long as the stairways. Check any carpeting to make sure that it is firmly attached to the stairs. Fix any carpet that is loose or worn. Avoid having throw rugs at the top or bottom of the stairs. If you do have throw rugs, attach them to the floor with carpet tape. Make sure that you have a light switch at the top of the stairs and the bottom of the  stairs. If you do not have them, ask someone to add them for you. What else can I do to help prevent falls? Wear shoes that: Do not have high heels. Have rubber bottoms. Are comfortable and fit you well. Are closed at the toe. Do not wear sandals. If you use a stepladder: Make sure that it is fully opened. Do not climb a closed stepladder. Make sure that both sides of the stepladder are locked into place. Ask someone to hold it for you, if possible. Clearly mark and make sure that you can see: Any grab bars or handrails. First and last steps. Where the edge of each step is. Use tools that help you move around (mobility aids) if they are needed. These include: Canes. Walkers. Scooters. Crutches. Turn on the lights when you go into a dark area. Replace any light bulbs as  soon as they burn out. Set up your furniture so you have a clear path. Avoid moving your furniture around. If any of your floors are uneven, fix them. If there are any pets around you, be aware of where they are. Review your medicines with your doctor. Some medicines can make you feel dizzy. This can increase your chance of falling. Ask your doctor what other things that you can do to help prevent falls. This information is not intended to replace advice given to you by your health care provider. Make sure you discuss any questions you have with your health care provider. Document Released: 02/25/2009 Document Revised: 10/07/2015 Document Reviewed: 06/05/2014 Elsevier Interactive Patient Education  2017 [employer].

## 2023-08-16 NOTE — Progress Notes (Signed)
 Subjective:   [name] is a 83 y.o. female who presents for Medicare Annual (Subsequent) preventive examination.  Visit Complete: Virtual I connected with  Michiel Sites on 08/16/23 by a audio enabled telemedicine application and verified that I am speaking with the correct person using two identifiers.  Patient Location: Home  Provider Location: Home Office  I discussed the limitations of evaluation and management by telemedicine. The patient expressed understanding and agreed to proceed.  Vital Signs: Because this visit was a virtual/telehealth visit, some criteria may be missing or patient reported. Any vitals not documented were not able to be obtained and vitals that have been documented are patient reported  Cardiac Risk Factors include: advanced age (>54men, >37 women);hypertension     Objective:    There were no vitals filed for this visit. There is no height or weight on file to calculate BMI.     08/16/2023    9:45 AM 07/20/2022    1:04 PM 06/09/2021    9:18 AM 10/18/2020   11:31 AM 12/06/2017    8:46 AM  Advanced Directives  Does Patient Have a Medical Advance Directive? No No No No No  Would patient like information on creating a medical advance directive?  Yes (MAU/Ambulatory/Procedural Areas - Information given) No - Patient declined No - Patient declined Yes (MAU/Ambulatory/Procedural Areas - Information given)    Current Medications (verified) Outpatient Encounter Medications as of 08/16/2023  Medication Sig   Accu-Chek Softclix Lancets lancets Use as instructed   Alcohol Swabs (B-D SINGLE USE SWABS REGULAR) PADS As needed   amLODipine (NORVASC) 10 MG tablet TAKE 1 TABLET EVERY DAY   Ascorbic Acid (VITAMIN C) 1000 MG tablet Take 1,000 mg by mouth daily.   aspirin 81 MG chewable tablet Chew 81 mg by mouth daily.   atorvastatin (LIPITOR) 80 MG tablet TAKE 1 TABLET EVERY DAY   Blood Glucose Calibration (ACCU-CHEK GUIDE CONTROL) LIQD As directed    Blood Glucose Monitoring Suppl (ACCU-CHEK AVIVA PLUS) w/Device KIT USE AS DIRECTED TO CHECK BLOOD SUGAR   Blood Glucose Monitoring Suppl (ACCU-CHEK GUIDE) w/Device KIT As directed   Cholecalciferol (VITAMIN D) 2000 UNITS tablet Take 1,000 Units by mouth daily.    Coenzyme Q10 (CO Q-10) 100 MG CAPS Take by mouth.   diclofenac (VOLTAREN) 75 MG EC tablet Take 1 tablet (75 mg total) by mouth 2 (two) times daily.   glucose blood (ACCU-CHEK AVIVA PLUS) test strip Use as instructed   Lancet Devices (ACCU-CHEK SOFTCLIX) lancets Check BS qd - DX- E11.9   losartan-hydrochlorothiazide (HYZAAR) 100-25 MG tablet TAKE 1 TABLET EVERY DAY   metoprolol succinate (TOPROL-XL) 50 MG 24 hr tablet TAKE 1 TABLET EVERY DAY WITH OR IMMEDIATELY FOLLOWING A MEAL.   Omega-3 Fatty Acids (FISH OIL CONCENTRATE PO) Take by mouth.   pantoprazole (PROTONIX) 40 MG tablet Take 1 tablet (40 mg total) by mouth daily.   Polyethyl Glycol-Propyl Glycol 0.4-0.3 % SOLN Apply to eye.   polyethylene glycol (MIRALAX / GLYCOLAX) packet Take 17 g by mouth daily.   pyridOXINE (VITAMIN B-6) 100 MG tablet Take 100 mg by mouth daily as needed.   triamcinolone cream (KENALOG) 0.1 % Apply 1 Application topically 2 (two) times daily.   doxazosin (CARDURA) 1 MG tablet TAKE 1 TABLET EVERY DAY (Patient not taking: Reported on 08/16/2023)   No facility-administered encounter medications on file as of 08/16/2023.    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     Eye tumor being followed at [facility]   Past Surgical History:  Procedure Laterality Date   lt eye implant  1998   rt knee surg  1991   TUBAL LIGATION  1981   Family History  Problem Relation Age of Onset   Stroke Paternal Grandmother    Diabetes Mother    Diabetes Sister    Diabetes Grandchild    Social History   Socioeconomic History   Marital status: Widowed    Spouse name: Not on file   Number of children: Not on file   Years of education: Not on file   Highest education level: Not on file  Occupational History   Not on file  Tobacco Use   Smoking status: Former    Current packs/day: 0.00    Types: Cigarettes    Quit date: 09/07/1973    Years since quitting: 49.9   Smokeless tobacco: Never  Vaping Use   Vaping status: Never Used  Substance and Sexual Activity   Alcohol use: No   Drug use: No   Sexual activity: Not Currently  Other Topics Concern   Not on file  Social History Narrative   Not on file   Social Drivers of Health   Financial Resource Strain: Low Risk  (08/16/2023)   Overall Financial Resource Strain (CARDIA)    Difficulty of Paying Living Expenses: Not hard at all  Food Insecurity: No Food Insecurity (08/16/2023)   Hunger Vital Sign    Worried About Running Out of Food in the Last Year: Never true    Ran Out of Food in the Last Year: Never true  Transportation Needs: No Transportation Needs (08/16/2023)   PRAPARE - Administrator, Civil Service (Medical): No    Lack of Transportation (Non-Medical): No  Physical Activity: Insufficiently Active (08/16/2023)   Exercise Vital Sign    Days of Exercise per Week: 3 days    Minutes of Exercise per Session: 30 min  Stress: No Stress Concern Present (08/16/2023)   [employer] of Occupational Health - Occupational Stress Questionnaire    Feeling of Stress : Not at all  Social Connections: Moderately Integrated (08/16/2023)   Social Connection and Isolation Panel [NHANES]    Frequency of  Communication with Friends and Family: More than three times a week    Frequency of Social Gatherings with Friends and Family: Twice a week    Attends Religious Services: 1 to 4 times per year    Active Member of [employer] or Organizations: Yes    Attends Banker Meetings: 1 to 4 times per year    Marital Status: Widowed    Tobacco Counseling Counseling given: Not Answered   Clinical Intake:  Pre-visit preparation completed: Yes  Pain : No/denies pain     Diabetes: No  How often do you need to have someone help you when you read instructions, pamphlets, or  other written materials from your doctor or pharmacy?: 1 - Never  Interpreter Needed?: No  Information entered by :: Remi Haggard LPN   Activities of Daily Living    08/16/2023    9:49 AM  In your present state of health, do you have any difficulty performing the following activities:  Hearing? 0  Vision? 0  Difficulty concentrating or making decisions? 0  Walking or climbing stairs? 1  Dressing or bathing? 0  Doing errands, shopping? 1  Preparing Food and eating ? N  Using the Toilet? N  In the past six months, have you accidently leaked urine? N  Do you have problems with loss of bowel control? N  Managing your Medications? N  Managing your Finances? N  Housekeeping or managing your Housekeeping? N    Patient Care Team: Donita Brooks, MD as PCP - General (Family Medicine) Jake Bathe, MD as PCP - Cardiology (Cardiology) Erroll Luna, [facility] (Inactive) as Pharmacist (Pharmacist)  Indicate any recent Medical Services you may have received from other than [facility] providers in the past year (date may be approximate).     Assessment:   This is a routine wellness examination for Aylani.  Hearing/Vision screen Hearing Screening - Comments:: No trouble hearing Vision Screening - Comments:: Up to date Unsure of name    Goals Addressed             This Visit's Progress    Patient  Stated       Continue current lifestyle       Depression Screen    08/16/2023    9:55 AM 02/15/2023    8:43 AM 10/24/2022   10:16 AM 07/20/2022    1:03 PM 06/23/2022    9:48 AM 06/09/2021    9:13 AM 07/08/2019   11:13 AM  PHQ 2/9 Scores  PHQ - 2 Score 0 0 0 0 0 0 0  PHQ- 9 Score 0          Fall Risk    08/16/2023    9:45 AM 02/15/2023    8:43 AM 10/24/2022   10:16 AM 07/20/2022   12:59 PM 06/23/2022    9:48 AM  Fall Risk   Falls in the past year? 0 1 1 1 1   Number falls in past yr: 0 0 0 1 1  Injury with Fall? 0 0 1 1 1   Risk for fall due to : Impaired balance/gait History of fall(s);Impaired balance/gait;Impaired mobility History of fall(s);Impaired balance/gait;Impaired mobility History of fall(s);Impaired balance/gait;Impaired mobility History of fall(s);Impaired balance/gait;Orthopedic patient  Follow up Falls evaluation completed;Education provided;Falls prevention discussed Education provided;Falls prevention discussed;Falls evaluation completed Education provided;Falls prevention discussed Falls evaluation completed;Education provided;Falls prevention discussed Education provided;Falls prevention discussed    MEDICARE RISK AT HOME: Medicare Risk at Home Any stairs in or around the home?: No If so, are there any without handrails?: No Home free of loose throw rugs in walkways, pet beds, electrical cords, etc?: Yes Adequate lighting in your home to reduce risk of falls?: Yes Life alert?: No Use of a cane, walker or w/c?: Yes Grab bars in the bathroom?: Yes Shower chair or bench in shower?: Yes Elevated toilet seat or a handicapped toilet?: Yes  TIMED UP AND GO:  Was the test performed?  No    Cognitive Function:    12/06/2017    8:51 AM  MMSE - Mini Mental State Exam  Orientation to time 5  Orientation to Place 5  Registration 3  Attention/  Calculation 0  Recall 3  Language- name 2 objects 2  Language- repeat 1  Language- follow 3 step command 3  Language- read &  follow direction 1  Write a sentence 1  Copy design 0  Total score 24        08/16/2023    9:51 AM 07/20/2022    1:05 PM 06/09/2021    9:27 AM  6CIT Screen  What Year? 0 points 0 points 0 points  What month? 0 points 0 points 0 points  What time? 0 points 0 points 0 points  Count back from 20 0 points 0 points 0 points  Months in reverse 2 points 2 points 4 points  Repeat phrase 2 points 2 points 2 points  Total Score 4 points 4 points 6 points    Immunizations Immunization History  Administered Date(s) Administered   Fluad Quad(high Dose 65+) 01/31/2019, 01/28/2020, 01/27/2021   Fluad Trivalent(High Dose 65+) 02/15/2023   Influenza Whole 01/17/2010, 02/07/2012   Influenza, High Dose Seasonal PF 02/13/2017   Influenza,inj,Quad PF,6+ Mos 02/04/2013, 02/11/2014, 02/16/2015, 01/18/2016, 01/25/2018   Influenza-Unspecified 02/06/2022   PFIZER(Purple Top)SARS-COV-2 Vaccination 07/12/2019, 08/09/2019, 03/05/2020   Pneumococcal Conjugate-13 05/28/2014   Pneumococcal Polysaccharide-23 04/25/2005, 02/11/2013   Td 10/14/1998   Tdap 12/06/2017    TDAP status: Up to date  Flu Vaccine status: Up to date  Pneumococcal vaccine status: Up to date  Covid-19 vaccine status: Information provided on how to obtain vaccines.   Qualifies for Shingles Vaccine? Yes   Zostavax completed No   Shingrix Completed?: No.    Education has been provided regarding the importance of this vaccine. Patient has been advised to call insurance company to determine out of pocket expense if they have not yet received this vaccine. Advised may also receive vaccine at local pharmacy or Health Dept. Verbalized acceptance and understanding.  Screening Tests Health Maintenance  Topic Date Due   Zoster Vaccines- Shingrix (1 of 2) Never done   DEXA SCAN  Never done   COVID-19 Vaccine (4 - 2024-25 season) 01/14/2023   INFLUENZA VACCINE  12/14/2023   Medicare Annual Wellness (AWV)  08/15/2024   DTaP/Tdap/Td (3 - Td  or Tdap) 12/07/2027   Pneumonia Vaccine 66+ Years old  Completed   HPV VACCINES  Aged Out    Health Maintenance  Health Maintenance Due  Topic Date Due   Zoster Vaccines- Shingrix (1 of 2) Never done   DEXA SCAN  Never done   COVID-19 Vaccine (4 - 2024-25 season) 01/14/2023    Colorectal cancer screening: No longer required.   Mammogram status: No longer required due to  .  Bone Density   declined  Lung Cancer Screening: (Low Dose CT Chest recommended if Age 34-80 years, 20 pack-year currently smoking OR have quit w/in 15years.) does not qualify.   Lung Cancer Screening Referral:   Additional Screening:  Hepatitis C Screening:  never done  Vision Screening: Recommended annual ophthalmology exams for early detection of glaucoma and other disorders of the eye. Is the patient up to date with their annual eye exam?  Yes  Who is the provider or what is the name of the office in which the patient attends annual eye exams? Unsure of name If pt is not established with a provider, would they like to be referred to a provider to establish care? No .   Dental Screening: Recommended annual dental exams for proper oral hygiene    Community Resource Referral / Chronic Care Management: CRR  required this visit?  No   CCM required this visit?  No     Plan:     I have personally reviewed and noted the following in the patient's chart:   Medical and social history Use of alcohol, tobacco or illicit drugs  Current medications and supplements including opioid prescriptions. Patient is not currently taking opioid prescriptions. Functional ability and status Nutritional status Physical activity Advanced directives List of other physicians Hospitalizations, surgeries, and ER visits in previous 12 months Vitals Screenings to include cognitive, depression, and falls Referrals and appointments  In addition, I have reviewed and discussed with patient certain preventive protocols,  quality metrics, and best practice recommendations. A written personalized care plan for preventive services as well as general preventive health recommendations were provided to patient.     Remi Haggard, LPN   05/20/1094   After Visit Summary: (MyChart) Due to this being a telephonic visit, the after visit summary with patients personalized plan was offered to patient via MyChart   Nurse Notes:

## 2023-08-22 ENCOUNTER — Other Ambulatory Visit: Payer: Self-pay | Admitting: Family Medicine

## 2023-08-22 NOTE — Telephone Encounter (Signed)
 OV 06/22/23 Requested Prescriptions  Pending Prescriptions Disp Refills   atorvastatin (LIPITOR) 80 MG tablet [Pharmacy Med Name: Atorvastatin Calcium Oral Tablet 80 MG] 90 tablet 3    Sig: TAKE 1 TABLET EVERY DAY     Cardiovascular:  Antilipid - Statins Failed - 08/22/2023  3:52 PM      Failed - Lipid Panel in normal range within the last 12 months    Cholesterol  Date Value Ref Range Status  06/22/2023 184 <200 mg/dL Final   LDL Cholesterol (Calc)  Date Value Ref Range Status  06/22/2023 105 (H) mg/dL (calc) Final    Comment:    Reference range: <100 . Desirable range <100 mg/dL for primary prevention;   <70 mg/dL for patients with CHD or diabetic patients  with > or = 2 CHD risk factors. Marland Kitchen LDL-C is now calculated using the Martin-Hopkins  calculation, which is a validated novel method providing  better accuracy than the Friedewald equation in the  estimation of LDL-C.  Horald Pollen et al. Lenox Ahr. 4132;440(10): 2061-2068  (http://education.[facility].com/faq/FAQ164)    HDL  Date Value Ref Range Status  06/22/2023 61 > OR = 50 mg/dL Final   Triglycerides  Date Value Ref Range Status  06/22/2023 86 <150 mg/dL Final         Passed - Patient is not pregnant      Passed - Valid encounter within last 12 months    Recent Outpatient Visits           2 months ago Pure hypercholesterolemia   [place] [facility] Donita Brooks, MD   6 months ago Gastroesophageal reflux disease, unspecified whether esophagitis present   [facility] Donita Brooks, MD   10 months ago Neuropathy   [place] Donita Brooks, MD   1 year ago Strain of right shoulder, initial encounter   [place] [facility] Donita Brooks, MD   1 year ago Urticaria   [facility] [place] [facility] Pickard, Priscille Heidelberg, MD

## 2023-11-20 ENCOUNTER — Ambulatory Visit (INDEPENDENT_AMBULATORY_CARE_PROVIDER_SITE_OTHER): Admitting: Family Medicine

## 2023-11-20 ENCOUNTER — Encounter: Payer: Self-pay | Admitting: Family Medicine

## 2023-11-20 VITALS — BP 140/82 | HR 66 | Temp 98.5°F | Ht 64.0 in | Wt 224.5 lb

## 2023-11-20 DIAGNOSIS — R7309 Other abnormal glucose: Secondary | ICD-10-CM | POA: Diagnosis not present

## 2023-11-20 DIAGNOSIS — R202 Paresthesia of skin: Secondary | ICD-10-CM | POA: Diagnosis not present

## 2023-11-20 MED ORDER — GABAPENTIN 100 MG PO CAPS: ORAL_CAPSULE | ORAL | 1 refills | Status: AC

## 2023-11-20 NOTE — Progress Notes (Signed)
 Patient Office Visit  Assessment & Plan:  Tingling of both feet -     Comprehensive metabolic panel with GFR -     CBC with Differential/Platelet -     TSH -     Vitamin B12 -     Gabapentin ; Start at night time with one and inc up to 3 per day (for tingling feet)  Dispense: 90 capsule; Refill: 1 -     Protein electrophoresis, serum  Elevated glucose -     Hemoglobin A1c   Assessment and Plan    Peripheral Neuropathy Chronic tingling and numbness in feet, possibly related to borderline diabetes. Gabapentin  recommended for neuropathic pain management. - Prescribe gabapentin  100 mg, start with one tablet at night, increase to three tablets as tolerated. - Perform blood work to check blood sugar levels and other relevant parameters. - [facility] in [place].  Hypertension- well controlled Hyperlipidemia Cholesterol levels managed with current medication.  General Health Maintenance Non-smoker. - Perform blood work to check overall health parameters.        Return if symptoms worsen or fail to improve.   Subjective:    Patient ID: Allen MARLA Gavel, female    DOB: 1940-12-29  Age: 83 y.o. MRN: [id number]  Chief Complaint  Patient presents with   Tingling    Tingling in bilateral feet for awhile per pt.     HPI Discussed the use of AI scribe software for clinical note transcription with the patient, who gave verbal consent to proceed.  History of Present Illness        MALISA RUGGIERO is an 83 year old female who presents with tingling in both feet for several months.   She has been experiencing tingling in both feet for several months, possibly up to a year. The sensation is described as tingling and sometimes numbness, occasionally extending up into her legs. The tingling is intermittent, occurring both day and night, and is currently mild, located towards the heel. She w as seen by Dr. Duanne in February with similar  complaints.   Prediabetes- She denies being diabetic but acknowledges being borderline diabetic. She monitors her blood sugar levels at home, with readings of 98 and 103 mg/dL. She checks her sugar levels when she plans to eat something outside of her usual diet.  She had surgery on her right knee in 1993 and experiences occasional swelling in the knee and right leg. She mentions receiving cortisone shots when visiting her son in [place] , which helps with walking and reduces swelling. However, she has not been able to visit him for about three years.  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 She retired at the age of 28. No breathing problems, and she does not smoke cigarettes or use marijuana. She reports sleeping well and denies any issues with sleep. Physical Exam Results LABS Blood Glucose: 103 Assessment & Plan Peripheral Neuropathy Chronic tingling and numbness in feet, possibly related to borderline diabetes. Gabapentin  recommended for neuropathic pain management. - Prescribe gabapentin  100 mg, start with one tablet at night, increase to three tablets as tolerated. Patient aware of potential negative side effects.  - Perform blood work to check blood sugar levels and other relevant parameters. - [facility].  Hypertension- overall well controlled Hyperlipidemia-on medication Cholesterol levels managed with current medication. Follow up with Dr. Duanne if not improving and for chronic  medical issues.     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    Eye tumor being followed at [facility]   Past Surgical History:  Procedure Laterality Date   lt eye implant  1998   rt knee surg  1991   TUBAL LIGATION  1981   Social History   Tobacco Use   Smoking status: Former    Current packs/day: 0.00    Types: Cigarettes    Quit date: 09/07/1973    Years since quitting: 50.2   Smokeless tobacco: Never  Vaping Use   Vaping status: Never Used  Substance Use Topics   Alcohol use: No   Drug use: No   Family History  Problem Relation Age of Onset   Stroke Paternal Grandmother    Diabetes Mother    Diabetes Sister    Diabetes Grandchild    Allergies  Allergen Reactions   Vytorin [Ezetimibe-Simvastatin] Other (See Comments)    myalgia    ROS    Objective:    BP (!) 140/82   Pulse 66   Temp 98.5 F (36.9 C)   Ht 5' 4 (1.626 m)   Wt 224 lb 8 oz (101.8 kg)   SpO2 98%   BMI 38.54 kg/m  BP Readings from Last 3 Encounters:  11/20/23 (!) 140/82  06/22/23 (!) 140/82  03/29/23 (!) 154/72   Wt Readings from Last 3 Encounters:  11/20/23 224 lb 8 oz (101.8 kg)  06/22/23 228 lb (103.4 kg)  03/29/23 231 lb 9.6 oz (105.1 kg)    Physical Exam Vitals and nursing note reviewed.  Constitutional:      General: She is not in acute distress.    Appearance: Normal appearance.     Comments: Uses cane  HENT:     Head: Normocephalic.     Right Ear: Tympanic membrane, ear canal and external ear normal.     Left Ear: Tympanic membrane, ear canal and external ear normal.  Eyes:     Extraocular Movements: Extraocular movements intact.     Pupils: Pupils are equal, round, and reactive to light.  Cardiovascular:     Rate and Rhythm: Normal rate and regular rhythm.     Heart sounds: Murmur heard.   Pulmonary:     Effort: Pulmonary effort is normal.     Breath sounds: Normal breath sounds.  Musculoskeletal:     Right lower leg: No edema.     Left lower leg: No edema.     Comments: Has right knee sleeve.   Neurological:     General: No focal deficit present.     Mental Status: She is alert and oriented to person, place, and time.     Gait: Gait is intact.  Comments: Feet- has good DP pulses bilaterally. No skin breakdown. Patient has thickened toenails bilaterally  Psychiatric:        Mood and Affect: Mood normal.        Behavior: Behavior normal.      No results found for any visits on 11/20/23.

## 2023-11-21 ENCOUNTER — Ambulatory Visit: Payer: Self-pay | Admitting: Family Medicine

## 2023-11-22 ENCOUNTER — Ambulatory Visit: Payer: Self-pay

## 2023-11-22 LAB — COMPREHENSIVE METABOLIC PANEL WITH GFR
AG Ratio: 1.5 (calc) (ref 1.0–2.5)
ALT: 31 U/L — ABNORMAL HIGH (ref 6–29)
AST: 27 U/L (ref 10–35)
Albumin: 4.2 g/dL (ref 3.6–5.1)
Alkaline phosphatase (APISO): 52 U/L (ref 37–153)
BUN: 16 mg/dL (ref 7–25)
CO2: 27 mmol/L (ref 20–32)
Calcium: 9.9 mg/dL (ref 8.6–10.4)
Chloride: 106 mmol/L (ref 98–110)
Creat: 0.63 mg/dL (ref 0.60–0.95)
Globulin: 2.8 g/dL (ref 1.9–3.7)
Glucose, Bld: 97 mg/dL (ref 65–99)
Potassium: 3.8 mmol/L (ref 3.5–5.3)
Sodium: 142 mmol/L (ref 135–146)
Total Bilirubin: 0.4 mg/dL (ref 0.2–1.2)
Total Protein: 7 g/dL (ref 6.1–8.1)
eGFR: 89 mL/min/1.73m2 (ref >=60)

## 2023-11-22 LAB — VITAMIN B12: Vitamin B-12: 229 pg/mL (ref 200–1100)

## 2023-11-22 LAB — CBC WITH DIFFERENTIAL/PLATELET
Absolute Lymphocytes: 3028 {cells}/uL (ref 850–3900)
Absolute Monocytes: 516 {cells}/uL (ref 200–950)
Basophils Absolute: 27 {cells}/uL (ref 0–200)
Basophils Relative: 0.4 %
Eosinophils Absolute: 221 {cells}/uL (ref 15–500)
Eosinophils Relative: 3.3 %
HCT: 40 % (ref 35.0–45.0)
Hemoglobin: 13.2 g/dL (ref 11.7–15.5)
MCH: 27.6 pg (ref 27.0–33.0)
MCHC: 33 g/dL (ref 32.0–36.0)
MCV: 83.5 fL (ref 80.0–100.0)
MPV: 10.9 fL (ref 7.5–12.5)
Monocytes Relative: 7.7 %
Neutro Abs: 2908 {cells}/uL (ref 1500–7800)
Neutrophils Relative %: 43.4 %
Platelets: 240 Thousand/uL (ref 140–400)
RBC: 4.79 Million/uL (ref 3.80–5.10)
RDW: 14.4 % (ref 11.0–15.0)
Total Lymphocyte: 45.2 %
WBC: 6.7 Thousand/uL (ref 3.8–10.8)

## 2023-11-22 LAB — TSH: TSH: 4.83 m[IU]/L — ABNORMAL HIGH (ref 0.40–4.50)

## 2023-11-22 LAB — HEMOGLOBIN A1C
Hgb A1c MFr Bld: 5.8 % — ABNORMAL HIGH (ref <5.7)
Mean Plasma Glucose: 120 mg/dL
eAG (mmol/L): 6.6 mmol/L

## 2023-11-22 LAB — PROTEIN ELECTROPHORESIS, SERUM
Albumin ELP: 4 g/dL (ref 3.8–4.8)
Alpha 1: 0.3 g/dL (ref 0.2–0.3)
Alpha 2: 0.8 g/dL (ref 0.5–0.9)
Beta 2: 0.5 g/dL (ref 0.2–0.5)
Beta Globulin: 0.3 g/dL — ABNORMAL LOW (ref 0.4–0.6)
Gamma Globulin: 1.2 g/dL (ref 0.8–1.7)
Total Protein: 7.2 g/dL (ref 6.1–8.1)

## 2023-11-22 NOTE — Telephone Encounter (Signed)
 FYI Only or Action Required?: Action required by provider: clinical question for provider, update on patient condition, and lab or test result follow-up needed.  Patient was last seen in primary care on 11/20/2023 by Aletha Bene, MD.  Called Nurse Triage reporting Dizziness.  Symptoms began several days ago.  Interventions attempted: Rest, hydration, or home remedies.  Symptoms are: gradually worsening.  Triage Disposition: Go to ED Now (or PCP Triage)  Patient/caregiver understands and will follow disposition?: No, wishes to speak with PCP   Copied from CRM [id number]. Topic: Clinical - Red Word Triage >> Nov 22, 2023 12:35 PM Winona R wrote: Pt returning call from office for lab result however she states she's dizzy and having watery eyes while taking the medication gabapentin  (NEURONTIN ) 100 MG capsule and not sure if she should continue to take it. Reason for Disposition  SEVERE dizziness (e.g., unable to stand, requires support to walk, feels like passing out now)  Answer Assessment - Initial Assessment Questions 1. DESCRIPTION: Describe your dizziness.     Lightheaded  2. LIGHTHEADED: Do you feel lightheaded? (e.g., somewhat faint, woozy, weak upon standing)     Faint 3. VERTIGO: Do you feel like either you or the room is spinning or tilting? (i.e., vertigo)     Denies  4. SEVERITY: How bad is it?  Do you feel like you are going to faint? Can you stand and walk?     Yes, but slowly 5. ONSET:  When did the dizziness begin?     Today 6. AGGRAVATING FACTORS: Does anything make it worse? (e.g., standing, change in head position)     If she walks to fast  7. HEART RATE: Can you tell me your heart rate? How many beats in 15 seconds?  (Note: Not all patients can do this.)       Feels normal  8. CAUSE: What do you think is causing the dizziness? (e.g., decreased fluids or food, diarrhea, emotional distress, heat exposure, new medicine, sudden standing,  vomiting; unknown)     Gabapentin -just started on Tuesday  9. RECURRENT SYMPTOM: Have you had dizziness before? If Yes, ask: When was the last time? What happened that time?     Denies  10. OTHER SYMPTOMS: Do you have any other symptoms? (e.g., fever, chest pain, vomiting, diarrhea, bleeding)       Eyes watering but no vision changes  Additional info: 1) Ongoing tingling in leg and feet, started gabapentin  and feels this is causing symptoms. She would like to switch to a cream for neuropathy. 2) Refusing ER evaluation would like pcp recommendation. She would like to know if she can come in office for evaluation.  3) Labs reviewed-she is aware that B12 is low and recommendation is 1,051mcg/daily. She would like to know if this will be prescribed or if she will need to to purchase over the counter.  Protocols used: Dizziness - Lightheadedness-A-AH

## 2023-11-22 NOTE — Progress Notes (Signed)
 Lvm for pt to return my call.

## 2023-11-23 ENCOUNTER — Telehealth: Payer: Self-pay

## 2023-11-23 NOTE — Telephone Encounter (Signed)
 First attempt. No contact made. Lvm for call back.

## 2023-11-23 NOTE — Telephone Encounter (Signed)
 Copied from CRM [id number]. Topic: General - Other >> Nov 23, 2023 12:12 PM Fonda T wrote: Reason for CRM: Patient returning call to office, per patient states she missed incoming call. Per chart review, returning call to [facility].  Called and spoke to office, unavailable at time of call. Will send CRM for a return call to patient at [phone].   Thank you!

## 2023-11-23 NOTE — Telephone Encounter (Signed)
 Contacted pt. Informed her per Dr. Duanne to stop gabapentin . She understood to stop.

## 2023-11-23 NOTE — Telephone Encounter (Signed)
 Copied from CRM [id number]. Topic: Clinical - Medical Advice >> Nov 23, 2023 11:42 AM Tiffini S wrote: Reason for CRM: Patient called stating that she do not take the gabapentin  medication today and she feels fine. Asked to talk with nurse about medication for missed call. Patient is taking B12 vitamins 1000mg . Transferred to [facility].

## 2023-11-23 NOTE — Telephone Encounter (Signed)
Spoke with pt and informed her of lab results  

## 2023-12-10 DIAGNOSIS — H5212 Myopia, left eye: Secondary | ICD-10-CM | POA: Diagnosis not present

## 2023-12-10 DIAGNOSIS — H524 Presbyopia: Secondary | ICD-10-CM | POA: Diagnosis not present

## 2023-12-10 DIAGNOSIS — H52223 Regular astigmatism, bilateral: Secondary | ICD-10-CM | POA: Diagnosis not present

## 2023-12-10 DIAGNOSIS — H5201 Hypermetropia, right eye: Secondary | ICD-10-CM | POA: Diagnosis not present

## 2023-12-17 DIAGNOSIS — H524 Presbyopia: Secondary | ICD-10-CM | POA: Diagnosis not present

## 2023-12-26 ENCOUNTER — Other Ambulatory Visit: Payer: Self-pay | Admitting: Family Medicine

## 2024-02-18 ENCOUNTER — Encounter: Payer: Self-pay | Admitting: Family Medicine

## 2024-02-18 ENCOUNTER — Ambulatory Visit (INDEPENDENT_AMBULATORY_CARE_PROVIDER_SITE_OTHER): Admitting: Family Medicine

## 2024-02-18 VITALS — BP 126/68 | HR 87 | Temp 97.8°F | Ht 64.0 in | Wt 223.0 lb

## 2024-02-18 DIAGNOSIS — E7800 Pure hypercholesterolemia, unspecified: Secondary | ICD-10-CM | POA: Diagnosis not present

## 2024-02-18 DIAGNOSIS — R7309 Other abnormal glucose: Secondary | ICD-10-CM

## 2024-02-18 DIAGNOSIS — Z23 Encounter for immunization: Secondary | ICD-10-CM | POA: Diagnosis not present

## 2024-02-18 LAB — CBC WITH DIFFERENTIAL/PLATELET
Absolute Lymphocytes: 3276 {cells}/uL (ref 850–3900)
Absolute Monocytes: 570 {cells}/uL (ref 200–950)
Basophils Absolute: 53 {cells}/uL (ref 0–200)
Basophils Relative: 0.7 %
Eosinophils Absolute: 198 {cells}/uL (ref 15–500)
Eosinophils Relative: 2.6 %
HCT: 39 % (ref 35.0–45.0)
Hemoglobin: 12.6 g/dL (ref 11.7–15.5)
MCH: 26.5 pg — ABNORMAL LOW (ref 27.0–33.0)
MCHC: 32.3 g/dL (ref 32.0–36.0)
MCV: 81.9 fL (ref 80.0–100.0)
MPV: 10.7 fL (ref 7.5–12.5)
Monocytes Relative: 7.5 %
Neutro Abs: 3504 {cells}/uL (ref 1500–7800)
Neutrophils Relative %: 46.1 %
Platelets: 248 Thousand/uL (ref 140–400)
RBC: 4.76 Million/uL (ref 3.80–5.10)
RDW: 14.5 % (ref 11.0–15.0)
Total Lymphocyte: 43.1 %
WBC: 7.6 Thousand/uL (ref 3.8–10.8)

## 2024-02-18 LAB — COMPREHENSIVE METABOLIC PANEL WITH GFR
AG Ratio: 1.5 (calc) (ref 1.0–2.5)
ALT: 27 U/L (ref 6–29)
AST: 21 U/L (ref 10–35)
Albumin: 4.4 g/dL (ref 3.6–5.1)
Alkaline phosphatase (APISO): 53 U/L (ref 37–153)
BUN/Creatinine Ratio: 30 (calc) — ABNORMAL HIGH (ref 6–22)
BUN: 26 mg/dL — ABNORMAL HIGH (ref 7–25)
CO2: 30 mmol/L (ref 20–32)
Calcium: 10 mg/dL (ref 8.6–10.4)
Chloride: 104 mmol/L (ref 98–110)
Creat: 0.87 mg/dL (ref 0.60–0.95)
Globulin: 2.9 g/dL (ref 1.9–3.7)
Glucose, Bld: 101 mg/dL — ABNORMAL HIGH (ref 65–99)
Potassium: 3.8 mmol/L (ref 3.5–5.3)
Sodium: 141 mmol/L (ref 135–146)
Total Bilirubin: 0.4 mg/dL (ref 0.2–1.2)
Total Protein: 7.3 g/dL (ref 6.1–8.1)
eGFR: 66 mL/min/1.73m2 (ref >=60)

## 2024-02-18 LAB — LIPID PANEL
Cholesterol: 145 mg/dL (ref <200)
HDL: 61 mg/dL (ref >=50)
LDL Cholesterol (Calc): 70 mg/dL
Non-HDL Cholesterol (Calc): 84 mg/dL (ref <130)
Total CHOL/HDL Ratio: 2.4 (calc) (ref <5.0)
Triglycerides: 64 mg/dL (ref <150)

## 2024-02-18 MED ORDER — ACCU-CHEK AVIVA PLUS VI STRP: ORAL_STRIP | 3 refills | Status: AC

## 2024-02-18 NOTE — Addendum Note (Signed)
 Addended by: ANGELENA RONAL BRADLEY K on: 02/18/2024 11:22 AM   Modules accepted: Orders

## 2024-02-18 NOTE — Progress Notes (Signed)
 Subjective:    Patient ID: [name], female    DOB: 1940-11-27, 83 y.o.   MRN: [id number] Patient is here today to repeat her fasting lab work to monitor her hyperlipidemia.  She also request a flu shot.  Her blood pressure today is outstanding at 126/68.  She denies any chest pain or shortness of breath or dyspnea on exertion.  She does have pain in her right knee due to osteoarthritis.  However at the present time she is controlling this by wearing a knee brace.  She declines a steroid injection today.  She denies any myalgias.  She denies any right upper quadrant pain.  She denies any abdominal pain or nausea or vomiting.  She denies any swelling in her legs. 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     aspirin 81 MG chewable tablet Chew 81 mg by mouth daily.     atorvastatin  (LIPITOR) 80 MG tablet TAKE 1 TABLET EVERY DAY 90 tablet 3   Blood Glucose Calibration (ACCU-CHEK GUIDE CONTROL) LIQD As directed 1 each 0   Blood Glucose  Monitoring Suppl (ACCU-CHEK AVIVA PLUS) w/Device KIT USE AS DIRECTED TO CHECK BLOOD SUGAR 1 kit 0   Blood Glucose Monitoring Suppl (ACCU-CHEK GUIDE) w/Device KIT As directed 1 kit 0   Cholecalciferol (VITAMIN D) 2000 UNITS tablet Take 1,000 Units by mouth daily.      Coenzyme Q10 (CO Q-10) 100 MG CAPS Take by mouth.     diclofenac  (VOLTAREN ) 75 MG EC tablet Take 1 tablet (75 mg total) by mouth 2 (two) times daily. 30 tablet 0   gabapentin  (NEURONTIN ) 100 MG capsule Start at night time with one and inc up to 3 per day (for tingling feet) 90 capsule 1   Lancet Devices (ACCU-CHEK SOFTCLIX) lancets Check BS qd - DX- E11.9 1 each 5   losartan -hydrochlorothiazide  (HYZAAR) 100-25 MG tablet TAKE 1 TABLET EVERY DAY 90 tablet 3   metoprolol  succinate (TOPROL -XL) 50 MG 24 hr tablet TAKE 1 TABLET EVERY DAY WITH OR IMMEDIATELY FOLLOWING A MEAL. 90 tablet 3   Omega-3 Fatty Acids (FISH OIL CONCENTRATE PO) Take by mouth.     pantoprazole  (PROTONIX ) 40 MG tablet Take 1 tablet (40 mg total) by mouth daily. 30 tablet 3  Polyethyl Glycol-Propyl Glycol 0.4-0.3 % SOLN Apply to eye.     polyethylene glycol (MIRALAX / GLYCOLAX) packet Take 17 g by mouth daily.     pyridOXINE (VITAMIN B-6) 100 MG tablet Take 100 mg by mouth daily as needed.     triamcinolone  cream (KENALOG ) 0.1 % Apply 1 Application topically 2 (two) times daily. 30 g 0   doxazosin  (CARDURA ) 1 MG tablet TAKE 1 TABLET EVERY DAY (Patient not taking: Reported on 02/18/2024) 90 tablet 3   No current facility-administered medications on file prior to visit.   Allergies  Allergen Reactions   Vytorin [Ezetimibe-Simvastatin] Other (See Comments)    myalgia   Social History   Socioeconomic History   Marital status: Widowed    Spouse name: Not on file   Number of children: Not on file   Years of education: Not on file   Highest education level: Not on file  Occupational History   Not on file  Tobacco Use   Smoking status: Former    Current  packs/day: 0.00    Types: Cigarettes    Quit date: 09/07/1973    Years since quitting: 50.4   Smokeless tobacco: Never  Vaping Use   Vaping status: Never Used  Substance and Sexual Activity   Alcohol use: No   Drug use: No   Sexual activity: Not Currently  Other Topics Concern   Not on file  Social History Narrative   Not on file   Social Drivers of Health   Financial Resource Strain: Low Risk  (08/16/2023)   Overall Financial Resource Strain (CARDIA)    Difficulty of Paying Living Expenses: Not hard at all  Food Insecurity: No Food Insecurity (08/16/2023)   Hunger Vital Sign    Worried About Running Out of Food in the Last Year: Never true    Ran Out of Food in the Last Year: Never true  Transportation Needs: No Transportation Needs (08/16/2023)   PRAPARE - Administrator, Civil Service (Medical): No    Lack of Transportation (Non-Medical): No  Physical Activity: Insufficiently Active (08/16/2023)   Exercise Vital Sign    Days of Exercise per Week: 3 days    Minutes of Exercise per Session: 30 min  Stress: No Stress Concern Present (08/16/2023)   [employer] of Occupational Health - Occupational Stress Questionnaire    Feeling of Stress : Not at all  Social Connections: Moderately Integrated (08/16/2023)   Social Connection and Isolation Panel    Frequency of Communication with Friends and Family: More than three times a week    Frequency of Social Gatherings with Friends and Family: Twice a week    Attends Religious Services: 1 to 4 times per year    Active Member of [employer] or Organizations: Yes    Attends Banker Meetings: 1 to 4 times per year    Marital Status: Widowed  Intimate Partner Violence: Not At Risk (08/16/2023)   Humiliation, Afraid, Rape, and Kick questionnaire    Fear of Current or Ex-Partner: No    Emotionally Abused: No    Physically Abused: No    Sexually Abused: No    Family History  Problem Relation Age of Onset   Stroke  Paternal Grandmother    Diabetes Mother    Diabetes Sister    Diabetes Grandchild      Review of Systems  All other systems reviewed and are negative.      Objective:   Physical Exam Vitals reviewed.  Constitutional:      General: She is not in acute distress.    Appearance: Normal appearance. She is well-developed. She is obese. She is not ill-appearing, toxic-appearing or diaphoretic.  HENT:     Head: Normocephalic and atraumatic.  Cardiovascular:     Rate and Rhythm: Normal rate and regular rhythm.     Pulses: Normal pulses.          Dorsalis pedis pulses are 2+ on the right side and 2+ on the left side.       Posterior tibial pulses are 2+ on the right side and 2+ on the left side.     Heart sounds: Murmur heard.     No friction rub. No gallop.  Pulmonary:     Effort: Pulmonary effort is normal. No respiratory distress.     Breath sounds: Normal breath sounds. No stridor. No wheezing, rhonchi or rales.  Chest:     Chest wall: No tenderness.  Abdominal:     General: Bowel sounds are normal.  Musculoskeletal:        General: No swelling or deformity.     Right knee: Decreased range of motion. Tenderness present over the medial joint line.     Left knee: Decreased range of motion. Tenderness present.     Right lower leg: No edema.     Left lower leg: No edema.     Right foot: Normal. Normal range of motion. No tenderness or bony tenderness.     Left foot: Normal. Normal range of motion. No tenderness or bony tenderness.  Neurological:     Mental Status: She is alert.           Assessment & Plan:  Elevated glucose - Plan: glucose blood (ACCU-CHEK AVIVA PLUS) test strip  Pure hypercholesterolemia - Plan: CBC with Differential/Platelet, Comprehensive metabolic panel with GFR, Lipid panel  We will continue to monitor her blood sugar.  She has mild prediabetes.  I will check a fasting lipid panel.  Goal LDL cholesterol is less than 100.SABRA  Blood pressure today is  outstanding.  Patient received her flu shot.  She declines a cortisone shot in her knee.  She will continue to wear her knee brace.  This seems to help her knee pain

## 2024-02-19 ENCOUNTER — Ambulatory Visit: Payer: Self-pay | Admitting: Family Medicine

## 2024-04-02 DIAGNOSIS — Z833 Family history of diabetes mellitus: Secondary | ICD-10-CM | POA: Diagnosis not present

## 2024-04-02 DIAGNOSIS — E785 Hyperlipidemia, unspecified: Secondary | ICD-10-CM | POA: Diagnosis not present

## 2024-04-02 DIAGNOSIS — Z6841 Body Mass Index (BMI) 40.0 and over, adult: Secondary | ICD-10-CM | POA: Diagnosis not present

## 2024-04-02 DIAGNOSIS — I10 Essential (primary) hypertension: Secondary | ICD-10-CM | POA: Diagnosis not present

## 2024-04-02 DIAGNOSIS — Z9989 Dependence on other enabling machines and devices: Secondary | ICD-10-CM | POA: Diagnosis not present

## 2024-04-02 DIAGNOSIS — G629 Polyneuropathy, unspecified: Secondary | ICD-10-CM | POA: Diagnosis not present

## 2024-04-02 DIAGNOSIS — Z87891 Personal history of nicotine dependence: Secondary | ICD-10-CM | POA: Diagnosis not present

## 2024-06-11 ENCOUNTER — Other Ambulatory Visit: Payer: Self-pay | Admitting: Family Medicine

## 2024-08-21 ENCOUNTER — Encounter
